# Patient Record
Sex: Female | Born: 1950 | Race: White | Hispanic: No | Smoking: Never smoker
Health system: Southern US, Community
[De-identification: ages and names within clinical notes are randomized; demographics above are authoritative.]

## PROBLEM LIST (undated history)

## (undated) DIAGNOSIS — M1990 Unspecified osteoarthritis, unspecified site: Secondary | ICD-10-CM

## (undated) DIAGNOSIS — E7800 Pure hypercholesterolemia, unspecified: Secondary | ICD-10-CM

## (undated) DIAGNOSIS — Z950 Presence of cardiac pacemaker: Secondary | ICD-10-CM

## (undated) DIAGNOSIS — M797 Fibromyalgia: Secondary | ICD-10-CM

## (undated) DIAGNOSIS — I499 Cardiac arrhythmia, unspecified: Secondary | ICD-10-CM

## (undated) DIAGNOSIS — R112 Nausea with vomiting, unspecified: Secondary | ICD-10-CM

## (undated) DIAGNOSIS — Z9889 Other specified postprocedural states: Secondary | ICD-10-CM

## (undated) DIAGNOSIS — I2510 Atherosclerotic heart disease of native coronary artery without angina pectoris: Secondary | ICD-10-CM

## (undated) DIAGNOSIS — C801 Malignant (primary) neoplasm, unspecified: Secondary | ICD-10-CM

## (undated) DIAGNOSIS — R51 Headache: Secondary | ICD-10-CM

## (undated) DIAGNOSIS — I10 Essential (primary) hypertension: Secondary | ICD-10-CM

## (undated) DIAGNOSIS — F419 Anxiety disorder, unspecified: Secondary | ICD-10-CM

## (undated) HISTORY — PX: INSERT / REPLACE / REMOVE PACEMAKER: SUR710

## (undated) HISTORY — PX: JOINT REPLACEMENT: SHX530

## (undated) HISTORY — PX: ABDOMINAL HYSTERECTOMY: SHX81

## (undated) HISTORY — PX: CHOLECYSTECTOMY: SHX55

## (undated) HISTORY — PX: FRACTURE SURGERY: SHX138

---

## 2006-01-31 ENCOUNTER — Emergency Department (HOSPITAL_COMMUNITY): Admission: EM | Admit: 2006-01-31 | Discharge: 2006-01-31 | Payer: Self-pay | Admitting: Emergency Medicine

## 2007-06-29 ENCOUNTER — Emergency Department (HOSPITAL_COMMUNITY): Admission: EM | Admit: 2007-06-29 | Discharge: 2007-06-29 | Payer: Self-pay | Admitting: Emergency Medicine

## 2007-07-10 ENCOUNTER — Emergency Department (HOSPITAL_COMMUNITY): Admission: EM | Admit: 2007-07-10 | Discharge: 2007-07-10 | Payer: Self-pay | Admitting: Emergency Medicine

## 2007-07-14 ENCOUNTER — Emergency Department (HOSPITAL_COMMUNITY): Admission: EM | Admit: 2007-07-14 | Discharge: 2007-07-14 | Payer: Self-pay | Admitting: Emergency Medicine

## 2007-07-31 ENCOUNTER — Emergency Department (HOSPITAL_COMMUNITY): Admission: EM | Admit: 2007-07-31 | Discharge: 2007-07-31 | Payer: Self-pay | Admitting: Emergency Medicine

## 2007-08-01 ENCOUNTER — Emergency Department (HOSPITAL_COMMUNITY): Admission: EM | Admit: 2007-08-01 | Discharge: 2007-08-01 | Payer: Self-pay | Admitting: Emergency Medicine

## 2007-08-30 ENCOUNTER — Emergency Department (HOSPITAL_COMMUNITY): Admission: EM | Admit: 2007-08-30 | Discharge: 2007-08-30 | Payer: Self-pay | Admitting: Emergency Medicine

## 2007-09-05 ENCOUNTER — Inpatient Hospital Stay (HOSPITAL_COMMUNITY): Admission: EM | Admit: 2007-09-05 | Discharge: 2007-09-11 | Payer: Self-pay | Admitting: Emergency Medicine

## 2007-09-10 ENCOUNTER — Encounter (INDEPENDENT_AMBULATORY_CARE_PROVIDER_SITE_OTHER): Payer: Self-pay | Admitting: Gastroenterology

## 2007-09-30 ENCOUNTER — Inpatient Hospital Stay (HOSPITAL_COMMUNITY): Admission: EM | Admit: 2007-09-30 | Discharge: 2007-10-09 | Payer: Self-pay | Admitting: Emergency Medicine

## 2007-10-20 ENCOUNTER — Emergency Department (HOSPITAL_COMMUNITY): Admission: EM | Admit: 2007-10-20 | Discharge: 2007-10-20 | Payer: Self-pay | Admitting: Emergency Medicine

## 2007-10-24 ENCOUNTER — Emergency Department (HOSPITAL_COMMUNITY): Admission: EM | Admit: 2007-10-24 | Discharge: 2007-10-24 | Payer: Self-pay | Admitting: Emergency Medicine

## 2007-10-24 ENCOUNTER — Ambulatory Visit: Payer: Self-pay | Admitting: Vascular Surgery

## 2007-10-24 ENCOUNTER — Encounter (INDEPENDENT_AMBULATORY_CARE_PROVIDER_SITE_OTHER): Payer: Self-pay | Admitting: Emergency Medicine

## 2007-11-25 ENCOUNTER — Emergency Department (HOSPITAL_COMMUNITY): Admission: EM | Admit: 2007-11-25 | Discharge: 2007-11-25 | Payer: Self-pay | Admitting: Emergency Medicine

## 2007-12-06 ENCOUNTER — Emergency Department (HOSPITAL_COMMUNITY): Admission: EM | Admit: 2007-12-06 | Discharge: 2007-12-06 | Payer: Self-pay | Admitting: Emergency Medicine

## 2007-12-14 ENCOUNTER — Emergency Department (HOSPITAL_COMMUNITY): Admission: EM | Admit: 2007-12-14 | Discharge: 2007-12-14 | Payer: Self-pay | Admitting: Emergency Medicine

## 2008-02-12 ENCOUNTER — Emergency Department (HOSPITAL_COMMUNITY): Admission: EM | Admit: 2008-02-12 | Discharge: 2008-02-12 | Payer: Self-pay | Admitting: Emergency Medicine

## 2008-02-14 ENCOUNTER — Inpatient Hospital Stay (HOSPITAL_COMMUNITY): Admission: EM | Admit: 2008-02-14 | Discharge: 2008-02-26 | Payer: Self-pay | Admitting: Emergency Medicine

## 2008-03-15 ENCOUNTER — Emergency Department (HOSPITAL_COMMUNITY): Admission: EM | Admit: 2008-03-15 | Discharge: 2008-03-15 | Payer: Self-pay | Admitting: Emergency Medicine

## 2008-05-05 ENCOUNTER — Emergency Department (HOSPITAL_COMMUNITY): Admission: EM | Admit: 2008-05-05 | Discharge: 2008-05-05 | Payer: Self-pay | Admitting: Emergency Medicine

## 2008-05-07 ENCOUNTER — Emergency Department (HOSPITAL_COMMUNITY): Admission: EM | Admit: 2008-05-07 | Discharge: 2008-05-07 | Payer: Self-pay | Admitting: Emergency Medicine

## 2008-05-14 ENCOUNTER — Emergency Department (HOSPITAL_COMMUNITY): Admission: EM | Admit: 2008-05-14 | Discharge: 2008-05-14 | Payer: Self-pay | Admitting: Emergency Medicine

## 2008-05-17 ENCOUNTER — Emergency Department (HOSPITAL_COMMUNITY): Admission: EM | Admit: 2008-05-17 | Discharge: 2008-05-18 | Payer: Self-pay | Admitting: Emergency Medicine

## 2008-05-24 ENCOUNTER — Emergency Department (HOSPITAL_COMMUNITY): Admission: EM | Admit: 2008-05-24 | Discharge: 2008-05-24 | Payer: Self-pay | Admitting: Emergency Medicine

## 2008-05-26 ENCOUNTER — Inpatient Hospital Stay (HOSPITAL_COMMUNITY): Admission: EM | Admit: 2008-05-26 | Discharge: 2008-05-29 | Payer: Self-pay | Admitting: Emergency Medicine

## 2008-06-04 ENCOUNTER — Emergency Department (HOSPITAL_COMMUNITY): Admission: EM | Admit: 2008-06-04 | Discharge: 2008-06-04 | Payer: Self-pay | Admitting: Emergency Medicine

## 2008-06-12 ENCOUNTER — Emergency Department (HOSPITAL_COMMUNITY): Admission: EM | Admit: 2008-06-12 | Discharge: 2008-06-13 | Payer: Self-pay | Admitting: Emergency Medicine

## 2008-06-14 ENCOUNTER — Emergency Department (HOSPITAL_COMMUNITY): Admission: EM | Admit: 2008-06-14 | Discharge: 2008-06-14 | Payer: Self-pay | Admitting: Emergency Medicine

## 2008-07-27 ENCOUNTER — Emergency Department (HOSPITAL_COMMUNITY): Admission: EM | Admit: 2008-07-27 | Discharge: 2008-07-27 | Payer: Self-pay | Admitting: Emergency Medicine

## 2008-08-15 ENCOUNTER — Emergency Department (HOSPITAL_COMMUNITY): Admission: EM | Admit: 2008-08-15 | Discharge: 2008-08-15 | Payer: Self-pay | Admitting: Emergency Medicine

## 2008-10-22 ENCOUNTER — Emergency Department (HOSPITAL_BASED_OUTPATIENT_CLINIC_OR_DEPARTMENT_OTHER): Admission: EM | Admit: 2008-10-22 | Discharge: 2008-10-22 | Payer: Self-pay | Admitting: Emergency Medicine

## 2008-10-25 ENCOUNTER — Emergency Department (HOSPITAL_BASED_OUTPATIENT_CLINIC_OR_DEPARTMENT_OTHER): Admission: EM | Admit: 2008-10-25 | Discharge: 2008-10-25 | Payer: Self-pay | Admitting: Emergency Medicine

## 2008-10-29 ENCOUNTER — Inpatient Hospital Stay (HOSPITAL_COMMUNITY): Admission: EM | Admit: 2008-10-29 | Discharge: 2008-10-31 | Payer: Self-pay | Admitting: Emergency Medicine

## 2008-11-09 ENCOUNTER — Inpatient Hospital Stay (HOSPITAL_COMMUNITY): Admission: EM | Admit: 2008-11-09 | Discharge: 2008-11-12 | Payer: Self-pay | Admitting: Emergency Medicine

## 2008-11-26 ENCOUNTER — Emergency Department (HOSPITAL_COMMUNITY): Admission: EM | Admit: 2008-11-26 | Discharge: 2008-11-26 | Payer: Self-pay | Admitting: Emergency Medicine

## 2008-11-29 ENCOUNTER — Emergency Department (HOSPITAL_COMMUNITY): Admission: EM | Admit: 2008-11-29 | Discharge: 2008-11-29 | Payer: Self-pay | Admitting: Emergency Medicine

## 2008-12-12 ENCOUNTER — Emergency Department (HOSPITAL_COMMUNITY): Admission: EM | Admit: 2008-12-12 | Discharge: 2008-12-12 | Payer: Self-pay | Admitting: Emergency Medicine

## 2008-12-26 ENCOUNTER — Emergency Department (HOSPITAL_COMMUNITY): Admission: EM | Admit: 2008-12-26 | Discharge: 2008-12-26 | Payer: Self-pay | Admitting: Emergency Medicine

## 2008-12-31 ENCOUNTER — Emergency Department (HOSPITAL_COMMUNITY): Admission: EM | Admit: 2008-12-31 | Discharge: 2008-12-31 | Payer: Self-pay | Admitting: Emergency Medicine

## 2009-01-03 ENCOUNTER — Emergency Department (HOSPITAL_COMMUNITY): Admission: EM | Admit: 2009-01-03 | Discharge: 2009-01-03 | Payer: Self-pay | Admitting: Emergency Medicine

## 2009-02-07 ENCOUNTER — Emergency Department (HOSPITAL_COMMUNITY): Admission: EM | Admit: 2009-02-07 | Discharge: 2009-02-07 | Payer: Self-pay | Admitting: Emergency Medicine

## 2009-03-13 ENCOUNTER — Emergency Department (HOSPITAL_COMMUNITY): Admission: EM | Admit: 2009-03-13 | Discharge: 2009-03-13 | Payer: Self-pay | Admitting: Emergency Medicine

## 2009-03-17 ENCOUNTER — Inpatient Hospital Stay (HOSPITAL_COMMUNITY): Admission: EM | Admit: 2009-03-17 | Discharge: 2009-03-19 | Payer: Self-pay | Admitting: Emergency Medicine

## 2009-03-30 ENCOUNTER — Emergency Department (HOSPITAL_COMMUNITY): Admission: EM | Admit: 2009-03-30 | Discharge: 2009-03-30 | Payer: Self-pay | Admitting: Emergency Medicine

## 2009-05-11 ENCOUNTER — Emergency Department (HOSPITAL_COMMUNITY): Admission: EM | Admit: 2009-05-11 | Discharge: 2009-05-11 | Payer: Self-pay | Admitting: Emergency Medicine

## 2009-06-20 ENCOUNTER — Emergency Department (HOSPITAL_COMMUNITY): Admission: EM | Admit: 2009-06-20 | Discharge: 2009-06-20 | Payer: Self-pay | Admitting: Emergency Medicine

## 2009-07-25 ENCOUNTER — Emergency Department (HOSPITAL_COMMUNITY): Admission: EM | Admit: 2009-07-25 | Discharge: 2009-07-25 | Payer: Self-pay | Admitting: Emergency Medicine

## 2009-07-31 ENCOUNTER — Emergency Department (HOSPITAL_COMMUNITY): Admission: EM | Admit: 2009-07-31 | Discharge: 2009-07-31 | Payer: Self-pay | Admitting: Emergency Medicine

## 2009-08-14 ENCOUNTER — Emergency Department (HOSPITAL_COMMUNITY): Admission: EM | Admit: 2009-08-14 | Discharge: 2009-08-14 | Payer: Self-pay | Admitting: Emergency Medicine

## 2009-08-21 ENCOUNTER — Observation Stay (HOSPITAL_COMMUNITY): Admission: EM | Admit: 2009-08-21 | Discharge: 2009-08-22 | Payer: Self-pay | Admitting: Emergency Medicine

## 2009-09-16 ENCOUNTER — Inpatient Hospital Stay (HOSPITAL_COMMUNITY): Admission: EM | Admit: 2009-09-16 | Discharge: 2009-09-17 | Payer: Self-pay | Admitting: Emergency Medicine

## 2009-09-24 ENCOUNTER — Emergency Department (HOSPITAL_COMMUNITY): Admission: EM | Admit: 2009-09-24 | Discharge: 2009-09-24 | Payer: Self-pay | Admitting: Emergency Medicine

## 2009-09-26 ENCOUNTER — Emergency Department (HOSPITAL_COMMUNITY): Admission: EM | Admit: 2009-09-26 | Discharge: 2009-09-26 | Payer: Self-pay | Admitting: Emergency Medicine

## 2009-09-30 ENCOUNTER — Emergency Department (HOSPITAL_COMMUNITY): Admission: EM | Admit: 2009-09-30 | Discharge: 2009-09-30 | Payer: Self-pay | Admitting: Emergency Medicine

## 2009-10-02 ENCOUNTER — Emergency Department (HOSPITAL_COMMUNITY): Admission: EM | Admit: 2009-10-02 | Discharge: 2009-10-02 | Payer: Self-pay | Admitting: Emergency Medicine

## 2009-12-26 ENCOUNTER — Inpatient Hospital Stay (HOSPITAL_COMMUNITY): Admission: EM | Admit: 2009-12-26 | Discharge: 2009-12-29 | Payer: Self-pay | Admitting: Emergency Medicine

## 2009-12-26 ENCOUNTER — Ambulatory Visit: Payer: Self-pay | Admitting: Internal Medicine

## 2009-12-26 ENCOUNTER — Ambulatory Visit: Payer: Self-pay | Admitting: Cardiology

## 2010-01-29 ENCOUNTER — Emergency Department (HOSPITAL_COMMUNITY): Admission: EM | Admit: 2010-01-29 | Discharge: 2010-01-29 | Payer: Self-pay | Admitting: Emergency Medicine

## 2010-03-26 ENCOUNTER — Observation Stay (HOSPITAL_COMMUNITY): Admission: EM | Admit: 2010-03-26 | Discharge: 2010-03-28 | Payer: Self-pay | Admitting: Emergency Medicine

## 2010-03-26 ENCOUNTER — Ambulatory Visit: Payer: Self-pay | Admitting: Internal Medicine

## 2010-04-01 ENCOUNTER — Emergency Department (HOSPITAL_COMMUNITY): Admission: EM | Admit: 2010-04-01 | Discharge: 2010-04-01 | Payer: Self-pay | Admitting: Emergency Medicine

## 2010-04-22 ENCOUNTER — Inpatient Hospital Stay (HOSPITAL_COMMUNITY): Admission: EM | Admit: 2010-04-22 | Discharge: 2010-04-25 | Payer: Self-pay | Admitting: Emergency Medicine

## 2010-04-29 ENCOUNTER — Emergency Department (HOSPITAL_COMMUNITY): Admission: EM | Admit: 2010-04-29 | Discharge: 2010-04-29 | Payer: Self-pay | Admitting: Emergency Medicine

## 2010-05-04 ENCOUNTER — Emergency Department (HOSPITAL_COMMUNITY): Admission: EM | Admit: 2010-05-04 | Discharge: 2010-05-04 | Payer: Self-pay | Admitting: Emergency Medicine

## 2010-05-06 ENCOUNTER — Emergency Department (HOSPITAL_COMMUNITY): Admission: EM | Admit: 2010-05-06 | Discharge: 2010-05-06 | Payer: Self-pay | Admitting: Emergency Medicine

## 2010-05-22 ENCOUNTER — Emergency Department (HOSPITAL_COMMUNITY): Admission: EM | Admit: 2010-05-22 | Discharge: 2010-05-22 | Payer: Self-pay | Admitting: Emergency Medicine

## 2010-07-26 ENCOUNTER — Emergency Department (HOSPITAL_COMMUNITY): Admission: EM | Admit: 2010-07-26 | Discharge: 2010-07-26 | Payer: Self-pay | Admitting: Emergency Medicine

## 2010-10-14 ENCOUNTER — Emergency Department (INDEPENDENT_AMBULATORY_CARE_PROVIDER_SITE_OTHER)
Admission: EM | Admit: 2010-10-14 | Discharge: 2010-10-14 | Payer: Self-pay | Source: Home / Self Care | Admitting: Emergency Medicine

## 2010-10-14 DIAGNOSIS — R51 Headache: Secondary | ICD-10-CM

## 2010-10-14 LAB — DIFFERENTIAL
Basophils Absolute: 0 10*3/uL (ref 0.0–0.1)
Eosinophils Relative: 0 % (ref 0–5)
Lymphocytes Relative: 15 % (ref 12–46)
Lymphs Abs: 1.8 10*3/uL (ref 0.7–4.0)
Neutro Abs: 9.5 10*3/uL — ABNORMAL HIGH (ref 1.7–7.7)
Neutrophils Relative %: 80 % — ABNORMAL HIGH (ref 43–77)

## 2010-10-14 LAB — BASIC METABOLIC PANEL
BUN: 7 mg/dL (ref 6–23)
CO2: 23 mEq/L (ref 19–32)
Chloride: 109 mEq/L (ref 96–112)
Glucose, Bld: 106 mg/dL — ABNORMAL HIGH (ref 70–99)
Potassium: 3.9 mEq/L (ref 3.5–5.1)
Sodium: 144 mEq/L (ref 135–145)

## 2010-10-14 LAB — CBC
HCT: 38.4 % (ref 36.0–46.0)
Hemoglobin: 12.8 g/dL (ref 12.0–15.0)
MCV: 82.6 fL (ref 78.0–100.0)
RBC: 4.65 MIL/uL (ref 3.87–5.11)
WBC: 11.9 10*3/uL — ABNORMAL HIGH (ref 4.0–10.5)

## 2010-12-01 LAB — URINE MICROSCOPIC-ADD ON

## 2010-12-01 LAB — URINALYSIS, ROUTINE W REFLEX MICROSCOPIC
Bilirubin Urine: NEGATIVE
Glucose, UA: NEGATIVE mg/dL
Ketones, ur: NEGATIVE mg/dL
Specific Gravity, Urine: 1.006 (ref 1.005–1.030)
pH: 6.5 (ref 5.0–8.0)

## 2010-12-02 LAB — URINALYSIS, ROUTINE W REFLEX MICROSCOPIC
Bilirubin Urine: NEGATIVE
Glucose, UA: NEGATIVE mg/dL
Glucose, UA: NEGATIVE mg/dL
Ketones, ur: NEGATIVE mg/dL
Nitrite: NEGATIVE
Protein, ur: NEGATIVE mg/dL
Protein, ur: NEGATIVE mg/dL
Specific Gravity, Urine: 1.013 (ref 1.005–1.030)
Specific Gravity, Urine: 1.016 (ref 1.005–1.030)
Urobilinogen, UA: 0.2 mg/dL (ref 0.0–1.0)
Urobilinogen, UA: 0.2 mg/dL (ref 0.0–1.0)
pH: 5.5 (ref 5.0–8.0)

## 2010-12-02 LAB — DIFFERENTIAL
Basophils Absolute: 0 10*3/uL (ref 0.0–0.1)
Basophils Absolute: 0 10*3/uL (ref 0.0–0.1)
Basophils Relative: 0 % (ref 0–1)
Basophils Relative: 0 % (ref 0–1)
Eosinophils Absolute: 0.1 10*3/uL (ref 0.0–0.7)
Eosinophils Relative: 1 % (ref 0–5)
Eosinophils Relative: 1 % (ref 0–5)
Lymphocytes Relative: 40 % (ref 12–46)
Lymphocytes Relative: 44 % (ref 12–46)
Lymphs Abs: 2.4 10*3/uL (ref 0.7–4.0)
Monocytes Absolute: 0.4 10*3/uL (ref 0.1–1.0)
Monocytes Absolute: 0.5 10*3/uL (ref 0.1–1.0)
Monocytes Absolute: 0.5 10*3/uL (ref 0.1–1.0)
Neutro Abs: 3 10*3/uL (ref 1.7–7.7)
Neutro Abs: 3.1 10*3/uL (ref 1.7–7.7)
Neutro Abs: 5.3 10*3/uL (ref 1.7–7.7)
Neutrophils Relative %: 47 % (ref 43–77)

## 2010-12-02 LAB — STOOL CULTURE

## 2010-12-02 LAB — CLOSTRIDIUM DIFFICILE EIA
C difficile Toxins A+B, EIA: NEGATIVE
C difficile Toxins A+B, EIA: NEGATIVE

## 2010-12-02 LAB — CK TOTAL AND CKMB (NOT AT ARMC): CK, MB: 0.9 ng/mL (ref 0.3–4.0)

## 2010-12-02 LAB — COMPREHENSIVE METABOLIC PANEL
ALT: 15 U/L (ref 0–35)
Albumin: 3.9 g/dL (ref 3.5–5.2)
Alkaline Phosphatase: 145 U/L — ABNORMAL HIGH (ref 39–117)
Alkaline Phosphatase: 154 U/L — ABNORMAL HIGH (ref 39–117)
Alkaline Phosphatase: 165 U/L — ABNORMAL HIGH (ref 39–117)
BUN: 14 mg/dL (ref 6–23)
BUN: 7 mg/dL (ref 6–23)
CO2: 25 mEq/L (ref 19–32)
Calcium: 8.9 mg/dL (ref 8.4–10.5)
Calcium: 9 mg/dL (ref 8.4–10.5)
Chloride: 111 mEq/L (ref 96–112)
Creatinine, Ser: 0.77 mg/dL (ref 0.4–1.2)
GFR calc Af Amer: >60 mL/min (ref >60)
GFR calc non Af Amer: >60 mL/min (ref >60)
Glucose, Bld: 105 mg/dL — ABNORMAL HIGH (ref 70–99)
Glucose, Bld: 119 mg/dL — ABNORMAL HIGH (ref 70–99)
Potassium: 3.9 mEq/L (ref 3.5–5.1)
Potassium: 4.1 mEq/L (ref 3.5–5.1)
Potassium: 4.1 mEq/L (ref 3.5–5.1)
Sodium: 138 mEq/L (ref 135–145)
Total Bilirubin: 0.4 mg/dL (ref 0.3–1.2)
Total Protein: 7.8 g/dL (ref 6.0–8.3)
Total Protein: 8.2 g/dL (ref 6.0–8.3)

## 2010-12-02 LAB — POCT CARDIAC MARKERS
CKMB, poc: 1.1 ng/mL (ref 1.0–8.0)
CKMB, poc: <1 ng/mL — ABNORMAL LOW (ref 1.0–8.0)
CKMB, poc: <1 ng/mL — ABNORMAL LOW (ref 1.0–8.0)
Myoglobin, poc: 58.1 ng/mL (ref 12–200)
Troponin i, poc: <0.05 ng/mL (ref 0.00–0.09)
Troponin i, poc: <0.05 ng/mL (ref 0.00–0.09)
Troponin i, poc: <0.05 ng/mL (ref 0.00–0.09)

## 2010-12-02 LAB — CBC
HCT: 34.8 % — ABNORMAL LOW (ref 36.0–46.0)
HCT: 35.2 % — ABNORMAL LOW (ref 36.0–46.0)
HCT: 38.5 % (ref 36.0–46.0)
Hemoglobin: 13.8 g/dL (ref 12.0–15.0)
MCH: 27.8 pg (ref 26.0–34.0)
MCH: 28.1 pg (ref 26.0–34.0)
MCH: 28.6 pg (ref 26.0–34.0)
MCHC: 33.3 g/dL (ref 30.0–36.0)
MCHC: 34.2 g/dL (ref 30.0–36.0)
MCV: 85.7 fL (ref 78.0–100.0)
MCV: 86.4 fL (ref 78.0–100.0)
MCV: 87.9 fL (ref 78.0–100.0)
Platelets: 273 10*3/uL (ref 150–400)
Platelets: 308 10*3/uL (ref 150–400)
Platelets: DECREASED 10*3/uL (ref 150–400)
RBC: 4.03 MIL/uL (ref 3.87–5.11)
RBC: 4.38 MIL/uL (ref 3.87–5.11)
RBC: 4.54 MIL/uL (ref 3.87–5.11)
RDW: 14.1 % (ref 11.5–15.5)
RDW: 14.8 % (ref 11.5–15.5)
WBC: 5.9 10*3/uL (ref 4.0–10.5)
WBC: 6.2 10*3/uL (ref 4.0–10.5)
WBC: 6.3 10*3/uL (ref 4.0–10.5)

## 2010-12-02 LAB — LIPASE, BLOOD: Lipase: 24 U/L (ref 11–59)

## 2010-12-02 LAB — POCT I-STAT, CHEM 8
BUN: 8 mg/dL (ref 6–23)
Chloride: 107 mEq/L (ref 96–112)
Creatinine, Ser: 0.6 mg/dL (ref 0.4–1.2)
HCT: 40 % (ref 36.0–46.0)
HCT: 44 % (ref 36.0–46.0)
Hemoglobin: 13.6 g/dL (ref 12.0–15.0)
Hemoglobin: 15 g/dL (ref 12.0–15.0)
Potassium: 4.3 mEq/L (ref 3.5–5.1)
Potassium: 5.5 mEq/L — ABNORMAL HIGH (ref 3.5–5.1)
Sodium: 137 mEq/L (ref 135–145)
Sodium: 141 mEq/L (ref 135–145)
Sodium: 141 mEq/L (ref 135–145)
TCO2: 23 mmol/L (ref 0–100)
TCO2: 27 mmol/L (ref 0–100)

## 2010-12-02 LAB — RAPID URINE DRUG SCREEN, HOSP PERFORMED
Amphetamines: NOT DETECTED
Amphetamines: NOT DETECTED
Barbiturates: NOT DETECTED
Benzodiazepines: POSITIVE — AB
Benzodiazepines: POSITIVE — AB
Benzodiazepines: POSITIVE — AB
Cocaine: NOT DETECTED
Opiates: NOT DETECTED
Tetrahydrocannabinol: NOT DETECTED
Tetrahydrocannabinol: NOT DETECTED

## 2010-12-02 LAB — GIARDIA/CRYPTOSPORIDIUM SCREEN(EIA)
Cryptosporidium Screen (EIA): NEGATIVE
Giardia Screen - EIA: NEGATIVE

## 2010-12-02 LAB — BASIC METABOLIC PANEL
Calcium: 8.8 mg/dL (ref 8.4–10.5)
Chloride: 106 mEq/L (ref 96–112)
Creatinine, Ser: 0.69 mg/dL (ref 0.4–1.2)
GFR calc Af Amer: >60 mL/min (ref >60)
Sodium: 140 mEq/L (ref 135–145)

## 2010-12-02 LAB — URINE MICROSCOPIC-ADD ON

## 2010-12-02 LAB — URINE CULTURE

## 2010-12-02 LAB — MAGNESIUM
Magnesium: 2.3 mg/dL (ref 1.5–2.5)
Magnesium: 2.3 mg/dL (ref 1.5–2.5)

## 2010-12-02 LAB — TROPONIN I: Troponin I: 0.02 ng/mL (ref 0.00–0.06)

## 2010-12-02 LAB — FECAL LACTOFERRIN, QUANT

## 2010-12-03 LAB — POCT I-STAT, CHEM 8
Calcium, Ion: 1.09 mmol/L — ABNORMAL LOW (ref 1.12–1.32)
Glucose, Bld: 113 mg/dL — ABNORMAL HIGH (ref 70–99)
HCT: 40 % (ref 36.0–46.0)
Hemoglobin: 13.6 g/dL (ref 12.0–15.0)

## 2010-12-04 LAB — RAPID URINE DRUG SCREEN, HOSP PERFORMED
Benzodiazepines: POSITIVE — AB
Cocaine: NOT DETECTED
Tetrahydrocannabinol: NOT DETECTED

## 2010-12-04 LAB — FECAL LACTOFERRIN, QUANT: Fecal Lactoferrin: NEGATIVE

## 2010-12-04 LAB — COMPREHENSIVE METABOLIC PANEL
ALT: 27 U/L (ref 0–35)
Calcium: 9 mg/dL (ref 8.4–10.5)
GFR calc Af Amer: >60 mL/min (ref >60)
Glucose, Bld: 96 mg/dL (ref 70–99)
Sodium: 138 mEq/L (ref 135–145)
Total Protein: 8.3 g/dL (ref 6.0–8.3)

## 2010-12-04 LAB — URINALYSIS, ROUTINE W REFLEX MICROSCOPIC
Glucose, UA: NEGATIVE mg/dL
Ketones, ur: NEGATIVE mg/dL
Nitrite: NEGATIVE
Protein, ur: NEGATIVE mg/dL

## 2010-12-04 LAB — CLOSTRIDIUM DIFFICILE EIA
C difficile Toxins A+B, EIA: NEGATIVE
C difficile Toxins A+B, EIA: NEGATIVE

## 2010-12-04 LAB — FECAL FAT, QUALITATIVE

## 2010-12-04 LAB — CBC
HCT: 42 % (ref 36.0–46.0)
MCHC: 33.2 g/dL (ref 30.0–36.0)
RDW: 15.9 % — ABNORMAL HIGH (ref 11.5–15.5)

## 2010-12-04 LAB — DIFFERENTIAL
Eosinophils Absolute: 0.1 10*3/uL (ref 0.0–0.7)
Lymphocytes Relative: 37 % (ref 12–46)
Lymphs Abs: 3.7 10*3/uL (ref 0.7–4.0)
Monocytes Relative: 6 % (ref 3–12)
Neutro Abs: 5.6 10*3/uL (ref 1.7–7.7)
Neutrophils Relative %: 55 % (ref 43–77)

## 2010-12-04 LAB — POTASSIUM: Potassium: 4.1 mEq/L (ref 3.5–5.1)

## 2010-12-06 LAB — DIFFERENTIAL
Basophils Absolute: 0 10*3/uL (ref 0.0–0.1)
Basophils Relative: 0 % (ref 0–1)
Eosinophils Absolute: 0.1 10*3/uL (ref 0.0–0.7)
Lymphs Abs: 2.8 10*3/uL (ref 0.7–4.0)
Neutrophils Relative %: 54 % (ref 43–77)

## 2010-12-06 LAB — URINALYSIS, ROUTINE W REFLEX MICROSCOPIC
Bilirubin Urine: NEGATIVE
Ketones, ur: NEGATIVE mg/dL
Nitrite: NEGATIVE
Protein, ur: NEGATIVE mg/dL

## 2010-12-06 LAB — COMPREHENSIVE METABOLIC PANEL
ALT: 14 U/L (ref 0–35)
Alkaline Phosphatase: 106 U/L (ref 39–117)
BUN: 7 mg/dL (ref 6–23)
CO2: 24 mEq/L (ref 19–32)
Calcium: 9 mg/dL (ref 8.4–10.5)
Chloride: 104 mEq/L (ref 96–112)
GFR calc non Af Amer: >60 mL/min (ref >60)
Glucose, Bld: 108 mg/dL — ABNORMAL HIGH (ref 70–99)
Potassium: 3.6 mEq/L (ref 3.5–5.1)
Sodium: 137 mEq/L (ref 135–145)
Total Bilirubin: 0.5 mg/dL (ref 0.3–1.2)
Total Protein: 7.9 g/dL (ref 6.0–8.3)

## 2010-12-06 LAB — CBC
Hemoglobin: 12.4 g/dL (ref 12.0–15.0)
MCHC: 33.7 g/dL (ref 30.0–36.0)
RBC: 4.3 MIL/uL (ref 3.87–5.11)

## 2010-12-06 LAB — LIPASE, BLOOD: Lipase: 22 U/L (ref 11–59)

## 2010-12-07 LAB — COMPREHENSIVE METABOLIC PANEL
ALT: 31 U/L (ref 0–35)
ALT: 33 U/L (ref 0–35)
AST: 36 U/L (ref 0–37)
AST: 38 U/L — ABNORMAL HIGH (ref 0–37)
CO2: 20 mEq/L (ref 19–32)
CO2: 27 mEq/L (ref 19–32)
Calcium: 8.3 mg/dL — ABNORMAL LOW (ref 8.4–10.5)
Calcium: 9.2 mg/dL (ref 8.4–10.5)
Chloride: 105 mEq/L (ref 96–112)
Chloride: 111 mEq/L (ref 96–112)
Creatinine, Ser: 0.82 mg/dL (ref 0.4–1.2)
GFR calc Af Amer: >60 mL/min (ref >60)
GFR calc non Af Amer: >60 mL/min (ref >60)
GFR calc non Af Amer: >60 mL/min (ref >60)
Glucose, Bld: 128 mg/dL — ABNORMAL HIGH (ref 70–99)
Potassium: 4.1 mEq/L (ref 3.5–5.1)
Sodium: 141 mEq/L (ref 135–145)
Sodium: 141 mEq/L (ref 135–145)
Total Bilirubin: 0.8 mg/dL (ref 0.3–1.2)

## 2010-12-07 LAB — IRON AND TIBC
Iron: 21 ug/dL — ABNORMAL LOW (ref 42–135)
UIBC: 208 ug/dL

## 2010-12-07 LAB — CBC
HCT: 31 % — ABNORMAL LOW (ref 36.0–46.0)
Hemoglobin: 10.2 g/dL — ABNORMAL LOW (ref 12.0–15.0)
Hemoglobin: 11.4 g/dL — ABNORMAL LOW (ref 12.0–15.0)
MCHC: 33.1 g/dL (ref 30.0–36.0)
MCV: 86.4 fL (ref 78.0–100.0)
RBC: 3.83 MIL/uL — ABNORMAL LOW (ref 3.87–5.11)
RBC: 3.96 MIL/uL (ref 3.87–5.11)
RDW: 16.1 % — ABNORMAL HIGH (ref 11.5–15.5)
WBC: 17.9 10*3/uL — ABNORMAL HIGH (ref 4.0–10.5)
WBC: 9.6 10*3/uL (ref 4.0–10.5)

## 2010-12-07 LAB — BASIC METABOLIC PANEL
BUN: 1 mg/dL — ABNORMAL LOW (ref 6–23)
BUN: <1 mg/dL — ABNORMAL LOW (ref 6–23)
CO2: 27 mEq/L (ref 19–32)
CO2: 27 mEq/L (ref 19–32)
Calcium: 8.9 mg/dL (ref 8.4–10.5)
Chloride: 104 mEq/L (ref 96–112)
Creatinine, Ser: 0.64 mg/dL (ref 0.4–1.2)
Creatinine, Ser: 0.71 mg/dL (ref 0.4–1.2)
GFR calc non Af Amer: >60 mL/min (ref >60)
Glucose, Bld: 115 mg/dL — ABNORMAL HIGH (ref 70–99)
Glucose, Bld: 136 mg/dL — ABNORMAL HIGH (ref 70–99)
Glucose, Bld: 140 mg/dL — ABNORMAL HIGH (ref 70–99)
Potassium: 3.2 mEq/L — ABNORMAL LOW (ref 3.5–5.1)
Sodium: 141 mEq/L (ref 135–145)

## 2010-12-07 LAB — DIFFERENTIAL
Basophils Absolute: 0 10*3/uL (ref 0.0–0.1)
Basophils Absolute: 0.1 10*3/uL (ref 0.0–0.1)
Eosinophils Absolute: 0.1 10*3/uL (ref 0.0–0.7)
Eosinophils Relative: 1 % (ref 0–5)
Eosinophils Relative: 3 % (ref 0–5)
Lymphocytes Relative: 37 % (ref 12–46)
Lymphs Abs: 2 10*3/uL (ref 0.7–4.0)
Lymphs Abs: 2.1 10*3/uL (ref 0.7–4.0)
Monocytes Absolute: 0.4 10*3/uL (ref 0.1–1.0)
Monocytes Relative: 8 % (ref 3–12)
Neutrophils Relative %: 71 % (ref 43–77)

## 2010-12-07 LAB — BRAIN NATRIURETIC PEPTIDE: Pro B Natriuretic peptide (BNP): 107 pg/mL — ABNORMAL HIGH (ref 0.0–100.0)

## 2010-12-07 LAB — URINALYSIS, ROUTINE W REFLEX MICROSCOPIC
Bilirubin Urine: NEGATIVE
Ketones, ur: NEGATIVE mg/dL
Nitrite: POSITIVE — AB
Protein, ur: NEGATIVE mg/dL
Urobilinogen, UA: 0.2 mg/dL (ref 0.0–1.0)
pH: 6 (ref 5.0–8.0)

## 2010-12-07 LAB — CARDIAC PANEL(CRET KIN+CKTOT+MB+TROPI)
CK, MB: 1.1 ng/mL (ref 0.3–4.0)
CK, MB: 1.2 ng/mL (ref 0.3–4.0)
Relative Index: INVALID (ref 0.0–2.5)
Total CK: 37 U/L (ref 7–177)

## 2010-12-07 LAB — VITAMIN B12: Vitamin B-12: 233 pg/mL (ref 211–911)

## 2010-12-07 LAB — CK TOTAL AND CKMB (NOT AT ARMC)
CK, MB: 1.2 ng/mL (ref 0.3–4.0)
Relative Index: INVALID (ref 0.0–2.5)
Total CK: 35 U/L (ref 7–177)

## 2010-12-07 LAB — GAMMA GT: GGT: 102 U/L — ABNORMAL HIGH (ref 7–51)

## 2010-12-07 LAB — POCT CARDIAC MARKERS: Troponin i, poc: <0.05 ng/mL (ref 0.00–0.09)

## 2010-12-19 LAB — CBC
Hemoglobin: 12.5 g/dL (ref 12.0–15.0)
MCV: 89.3 fL (ref 78.0–100.0)
RBC: 4.15 MIL/uL (ref 3.87–5.11)
WBC: 8.6 10*3/uL (ref 4.0–10.5)

## 2010-12-19 LAB — BASIC METABOLIC PANEL
BUN: 9 mg/dL (ref 6–23)
CO2: 22 mEq/L (ref 19–32)
Chloride: 112 mEq/L (ref 96–112)
Creatinine, Ser: 0.73 mg/dL (ref 0.4–1.2)
Potassium: 4.4 mEq/L (ref 3.5–5.1)

## 2010-12-19 LAB — MAGNESIUM: Magnesium: 2.6 mg/dL — ABNORMAL HIGH (ref 1.5–2.5)

## 2010-12-20 LAB — URINE MICROSCOPIC-ADD ON

## 2010-12-20 LAB — URINALYSIS, ROUTINE W REFLEX MICROSCOPIC
Glucose, UA: NEGATIVE mg/dL
Ketones, ur: NEGATIVE mg/dL
pH: 6 (ref 5.0–8.0)

## 2010-12-20 LAB — COMPREHENSIVE METABOLIC PANEL
ALT: 17 U/L (ref 0–35)
AST: 22 U/L (ref 0–37)
Albumin: 3.5 g/dL (ref 3.5–5.2)
Alkaline Phosphatase: 130 U/L — ABNORMAL HIGH (ref 39–117)
Potassium: 3.3 mEq/L — ABNORMAL LOW (ref 3.5–5.1)
Sodium: 141 mEq/L (ref 135–145)
Total Protein: 7.4 g/dL (ref 6.0–8.3)

## 2010-12-20 LAB — BASIC METABOLIC PANEL
CO2: 25 mEq/L (ref 19–32)
GFR calc non Af Amer: 59 mL/min — ABNORMAL LOW (ref >60)
Glucose, Bld: 243 mg/dL — ABNORMAL HIGH (ref 70–99)
Potassium: 5.2 mEq/L — ABNORMAL HIGH (ref 3.5–5.1)
Sodium: 134 mEq/L — ABNORMAL LOW (ref 135–145)

## 2010-12-20 LAB — CBC
Platelets: 295 10*3/uL (ref 150–400)
RDW: 14.4 % (ref 11.5–15.5)

## 2010-12-25 ENCOUNTER — Emergency Department (HOSPITAL_COMMUNITY): Payer: MEDICARE

## 2010-12-25 ENCOUNTER — Emergency Department (HOSPITAL_COMMUNITY)
Admission: EM | Admit: 2010-12-25 | Discharge: 2010-12-25 | Disposition: A | Discharge status: Home / Self Care | Payer: MEDICARE | Attending: Emergency Medicine | Admitting: Emergency Medicine

## 2010-12-25 DIAGNOSIS — R109 Unspecified abdominal pain: Secondary | ICD-10-CM | POA: Insufficient documentation

## 2010-12-25 DIAGNOSIS — R0789 Other chest pain: Secondary | ICD-10-CM | POA: Insufficient documentation

## 2010-12-25 DIAGNOSIS — R0609 Other forms of dyspnea: Secondary | ICD-10-CM | POA: Insufficient documentation

## 2010-12-25 DIAGNOSIS — R6883 Chills (without fever): Secondary | ICD-10-CM | POA: Insufficient documentation

## 2010-12-25 DIAGNOSIS — R0989 Other specified symptoms and signs involving the circulatory and respiratory systems: Secondary | ICD-10-CM | POA: Insufficient documentation

## 2010-12-25 DIAGNOSIS — G8929 Other chronic pain: Secondary | ICD-10-CM | POA: Insufficient documentation

## 2010-12-25 DIAGNOSIS — Z950 Presence of cardiac pacemaker: Secondary | ICD-10-CM | POA: Insufficient documentation

## 2010-12-25 DIAGNOSIS — R112 Nausea with vomiting, unspecified: Secondary | ICD-10-CM | POA: Insufficient documentation

## 2010-12-25 DIAGNOSIS — I10 Essential (primary) hypertension: Secondary | ICD-10-CM | POA: Insufficient documentation

## 2010-12-25 LAB — DIFFERENTIAL
Basophils Absolute: 0 10*3/uL (ref 0.0–0.1)
Eosinophils Absolute: 0.1 10*3/uL (ref 0.0–0.7)
Lymphs Abs: 2.9 10*3/uL (ref 0.7–4.0)
Neutro Abs: 5.7 10*3/uL (ref 1.7–7.7)

## 2010-12-25 LAB — CBC
MCH: 28.8 pg (ref 26.0–34.0)
MCHC: 33.2 g/dL (ref 30.0–36.0)
Platelets: 245 10*3/uL (ref 150–400)
RDW: 14 % (ref 11.5–15.5)

## 2010-12-25 LAB — URINE MICROSCOPIC-ADD ON

## 2010-12-25 LAB — URINALYSIS, ROUTINE W REFLEX MICROSCOPIC
Ketones, ur: NEGATIVE mg/dL
Nitrite: NEGATIVE
Protein, ur: NEGATIVE mg/dL
pH: 7 (ref 5.0–8.0)

## 2010-12-25 LAB — POCT CARDIAC MARKERS: Troponin i, poc: <0.05 ng/mL (ref 0.00–0.09)

## 2010-12-25 LAB — COMPREHENSIVE METABOLIC PANEL
AST: 19 U/L (ref 0–37)
Albumin: 3.5 g/dL (ref 3.5–5.2)
Calcium: 8.5 mg/dL (ref 8.4–10.5)
Creatinine, Ser: 0.61 mg/dL (ref 0.4–1.2)
GFR calc Af Amer: >60 mL/min (ref >60)
GFR calc non Af Amer: >60 mL/min (ref >60)

## 2010-12-25 LAB — VALPROIC ACID LEVEL: Valproic Acid Lvl: <10 ug/mL — ABNORMAL LOW (ref 50.0–100.0)

## 2010-12-25 LAB — PROTIME-INR: Prothrombin Time: 12.6 seconds (ref 11.6–15.2)

## 2010-12-26 LAB — BASIC METABOLIC PANEL
BUN: 3 mg/dL — ABNORMAL LOW (ref 6–23)
Chloride: 111 mEq/L (ref 96–112)
Glucose, Bld: 122 mg/dL — ABNORMAL HIGH (ref 70–99)
Potassium: 3.6 mEq/L (ref 3.5–5.1)

## 2010-12-26 LAB — CBC
HCT: 38.6 % (ref 36.0–46.0)
MCV: 87.4 fL (ref 78.0–100.0)
Platelets: 260 10*3/uL (ref 150–400)
WBC: 6.2 10*3/uL (ref 4.0–10.5)

## 2010-12-26 LAB — APTT

## 2010-12-29 LAB — URINE CULTURE

## 2010-12-29 LAB — URINE MICROSCOPIC-ADD ON

## 2010-12-29 LAB — URINALYSIS, ROUTINE W REFLEX MICROSCOPIC
Glucose, UA: NEGATIVE mg/dL
Hgb urine dipstick: NEGATIVE
Protein, ur: NEGATIVE mg/dL
Specific Gravity, Urine: 1.004 — ABNORMAL LOW (ref 1.005–1.030)
pH: 7.5 (ref 5.0–8.0)

## 2011-01-03 LAB — DIFFERENTIAL
Basophils Absolute: 0.1 10*3/uL (ref 0.0–0.1)
Basophils Absolute: 0 10*3/uL (ref 0.0–0.1)
Eosinophils Absolute: 0 10*3/uL (ref 0.0–0.7)
Eosinophils Absolute: 0.1 10*3/uL (ref 0.0–0.7)
Eosinophils Relative: 0 % (ref 0–5)
Eosinophils Relative: 1 % (ref 0–5)
Lymphocytes Relative: 29 % (ref 12–46)
Neutrophils Relative %: 59 % (ref 43–77)

## 2011-01-03 LAB — RAPID URINE DRUG SCREEN, HOSP PERFORMED
Amphetamines: NOT DETECTED
Barbiturates: NOT DETECTED
Benzodiazepines: POSITIVE — AB
Cocaine: NOT DETECTED

## 2011-01-03 LAB — CBC
HCT: 39.2 % (ref 36.0–46.0)
HCT: 39 % (ref 36.0–46.0)
MCHC: 34.2 g/dL (ref 30.0–36.0)
MCHC: 34.5 g/dL (ref 30.0–36.0)
MCV: 87.6 fL (ref 78.0–100.0)
MCV: 86.4 fL (ref 78.0–100.0)
Platelets: 329 10*3/uL (ref 150–400)
Platelets: 254 10*3/uL (ref 150–400)
RDW: 14.3 % (ref 11.5–15.5)
RDW: 14.2 % (ref 11.5–15.5)

## 2011-01-03 LAB — CK TOTAL AND CKMB (NOT AT ARMC)
Relative Index: INVALID (ref 0.0–2.5)
Relative Index: INVALID (ref 0.0–2.5)
Total CK: 42 U/L (ref 7–177)

## 2011-01-03 LAB — LIPID PANEL
Cholesterol: 272 mg/dL — ABNORMAL HIGH (ref 0–200)
LDL Cholesterol: 195 mg/dL — ABNORMAL HIGH (ref 0–99)

## 2011-01-03 LAB — URINALYSIS, ROUTINE W REFLEX MICROSCOPIC
Bilirubin Urine: NEGATIVE
Ketones, ur: NEGATIVE mg/dL
Nitrite: NEGATIVE
Protein, ur: NEGATIVE mg/dL

## 2011-01-03 LAB — COMPREHENSIVE METABOLIC PANEL
AST: 29 U/L (ref 0–37)
AST: 45 U/L — ABNORMAL HIGH (ref 0–37)
Albumin: 2.7 g/dL — ABNORMAL LOW (ref 3.5–5.2)
Alkaline Phosphatase: 117 U/L (ref 39–117)
CO2: 25 mEq/L (ref 19–32)
Chloride: 103 mEq/L (ref 96–112)
Chloride: 110 mEq/L (ref 96–112)
Creatinine, Ser: 0.61 mg/dL (ref 0.4–1.2)
GFR calc Af Amer: >60 mL/min (ref >60)
GFR calc Af Amer: >60 mL/min (ref >60)
GFR calc non Af Amer: >60 mL/min (ref >60)
Glucose, Bld: 126 mg/dL — ABNORMAL HIGH (ref 70–99)
Potassium: 3.7 mEq/L (ref 3.5–5.1)
Sodium: 137 mEq/L (ref 135–145)
Total Bilirubin: 0.4 mg/dL (ref 0.3–1.2)
Total Bilirubin: 0.4 mg/dL (ref 0.3–1.2)

## 2011-01-03 LAB — URINE CULTURE

## 2011-01-03 LAB — POCT I-STAT, CHEM 8
Calcium, Ion: 1.02 mmol/L — ABNORMAL LOW (ref 1.12–1.32)
Calcium, Ion: 1.12 mmol/L (ref 1.12–1.32)
Creatinine, Ser: 0.6 mg/dL (ref 0.4–1.2)
HCT: 40 % (ref 36.0–46.0)
Hemoglobin: 13.6 g/dL (ref 12.0–15.0)
Hemoglobin: 13.6 g/dL (ref 12.0–15.0)
Sodium: 144 mEq/L (ref 135–145)
Sodium: 144 mEq/L (ref 135–145)
TCO2: 25 mmol/L (ref 0–100)
TCO2: 25 mmol/L (ref 0–100)

## 2011-01-03 LAB — CULTURE, BLOOD (ROUTINE X 2): Culture: NO GROWTH

## 2011-01-03 LAB — SEDIMENTATION RATE: Sed Rate: 40 mm/hr — ABNORMAL HIGH (ref 0–22)

## 2011-01-03 LAB — PROTEIN AND GLUCOSE, CSF: Total  Protein, CSF: 40 mg/dL (ref 15–45)

## 2011-01-03 LAB — CSF CELL COUNT WITH DIFFERENTIAL: Tube #: 1

## 2011-01-03 LAB — APTT: aPTT: 30 seconds (ref 24–37)

## 2011-01-03 LAB — TROPONIN I
Troponin I: 0.01 ng/mL (ref 0.00–0.06)
Troponin I: <0.01 ng/mL (ref 0.00–0.06)

## 2011-01-03 LAB — HEMOGLOBIN A1C
Hgb A1c MFr Bld: 6.7 % — ABNORMAL HIGH (ref 4.6–6.1)
Mean Plasma Glucose: 146 mg/dL

## 2011-01-03 LAB — CSF CULTURE W GRAM STAIN: Gram Stain: NONE SEEN

## 2011-01-03 LAB — MAGNESIUM: Magnesium: 2.5 mg/dL (ref 1.5–2.5)

## 2011-01-31 NOTE — H&P (Signed)
[name], [name]              ACCOUNT NO.:  [id number]   MEDICAL RECORD NO.:  [id number]          PATIENT TYPE:  INP   LOCATION:  5524                         FACILITY:  [facility]   PHYSICIAN:  Jude Ojie, MD          DATE OF BIRTH:  1950-11-08   DATE OF ADMISSION:  03/16/2009  DATE OF DISCHARGE:                              HISTORY & PHYSICAL   CHIEF COMPLAINT:  Severe headache.   HISTORY OF PRESENTING COMPLAINT:  The patient is a 60 year old with  history of migraine headache, hypertension, and a pacemaker placement  previously with recurrent ED visits for complaints of headache with last  admission to the hospital in February 2010, presenting today with  complaints of a persistent headache.  The patient was evaluated in the  ED and required several doses of IV Dilaudid and Phenergan given tonight  and the headache still remained persistent.  The ED physician had  discussed with the neurologist on call and the plan was to admit the  patient if headaches were still persistent.  Plan was to give a dose of  Depacon.  The IV Depacon was started but these had to be discontinued as  the patient was having severe itching and we had to give her some  Benadryl.  The patient complains of associated nausea, photophobia, but  no vomiting.  No fevers.  No neck stiffness.  No syncope.  No visual  changes at this time.  At the time I saw the patient, the patient was  still having complaint of severe headaches.  I have discussed the case  with the neurologist on call Dr. Sharene Skeans who would see the patient in  the morning, but has recommended that the patient be started on DHE 45  injections at this time for management of severe migraine headaches.  Of  note, the patient had a CT scan done in the ED tonight which did not  show any acute findings.  The headache is mostly located in the  bilateral forehead, dull in nature and with severity of 10/10, at worse,  but down to about 7/10 at this time.   Headache was aggravated by sound  and light, but relieved by the patient lying still.   PAST MEDICAL HISTORY:  Significant for,  1. Hypertension.  2. Pacemaker placement.  3. Migraine headaches.  4. Chronic back pain.  5. History of fibromyalgia.   ALLERGIES:  The patient is allergic to COMPAZINE, DECADRON, IV DYE,  KEFLEX, MORPHINE, SHELLFISH, and TORADOL.   OUTPATIENT MEDICATION:  1. Valium 10 mg p.o. at bedtime.  2. Zocor 20 mg p.o. at bedtime.  3. Senokot 1 tablet p.o. as needed.  4. Colestipol 1 tablet p.o. b.i.d.  5. Atenolol 12.5 mg p.o. daily.  6. Prevacid 30 mg p.o. daily.  7. Excedrin p.o. daily.   FAMILY HISTORY:  The patient is married, lives with her husband at home.  Family history is significant for history of heart disease and diabetes  mellitus in a brother.   SOCIAL HISTORY:  The patient denies any history of smoking, alcohol, or  IV drug use.   REVIEW OF SYSTEMS:  GENERAL:  No fevers, weight loss, or loss of  appetite.  RESPIRATORY:  No cough or shortness of breath.  CVS:  No  chest pain or palpitation.  GI:  No nausea, vomiting, or diarrhea.  ENDOCRINE:  No polyuria, polydipsia, or polyphagia.  GENITOURINARY:  No  dysuria, frequency, or urgency.  HEME:  No easy skin bruising or  epistaxis.  PSYCH:  No depression.  MUSCULOSKELETAL:  No joint pains or  muscle aches.  NEURO:  Positive for severe headaches and photophobia,  but no syncope.   PHYSICAL EXAMINATION:  GENERAL:  A middle-aged Caucasian lady, currently  in moderate distress from complaints of headache.  HEENT:  Extraocular muscles intact.  NECK:  Supple.  No JVD.  No thyroid enlargement.  RESPIRATORY:  Chest clear to auscultation bilaterally.  CVS:  Heart sounds 1 and 2, regular rate and rhythm.  No murmurs or  gallops.  ABDOMEN:  Flat, nontender.  No organomegaly.  Bowel sounds positive in  all quadrants.  EXTREMITIES:  No cyanosis, clubbing, or edema.  Pedal pulses palpable  bilaterally.   NEUROLOGIC:  The patient is awake, alert, and oriented in time, place,  and person.  No focal findings at this time.   CT scan of the head done tonight did not show any acute intracranial  process.  No labs have been done at this time.  We will check CBC and  BMP with ESR in the morning.   ASSESSMENT:  1. Severe migraine headaches clearly analgesic rebound headache.  2. History of hypertension.  3. History of chronic back pain.  4. Pacemaker placement.   PLAN:  1. We will admit to inpatient under telemetry.  2. Neurology consultation.  I have discussed the case already with the      neurologist on-call Dr. Sharene Skeans who has recommended that the      patient be started on DHE 45 injections.  3. We will place the patient on DHE 45.  We will give 10 mg of IV      Reglan 15 minutes prior to the 1 mL of IV DHE 45 and repeat every 8      hourly for total of 9 doses as recommended by neurology.  We will      place the patient on DVT prophylaxis with Lovenox and also on      Protonix.   Case and plan discussed with the patient and she voiced understanding  and all questions were answered and she is in agreement with the plan of  care.      Jude Enid Baas, MD  Electronically Signed     JO/MEDQ  D:  03/17/2009  T:  03/17/2009  Job:  [id number]

## 2011-01-31 NOTE — H&P (Signed)
[name], [name]              ACCOUNT NO.:  [id number]   MEDICAL RECORD NO.:  [id number]          PATIENT TYPE:  INP   LOCATION:  5506                         FACILITY:  [facility]   PHYSICIAN:  Massie Maroon, MD        DATE OF BIRTH:  [birth year]   DATE OF ADMISSION:  10/27/2008  DATE OF DISCHARGE:                              HISTORY & PHYSICAL   CHIEF COMPLAINT:  Headache.   HISTORY OF PRESENT ILLNESS:  A 60 year old female with a history of  headaches apparently came to the ED this week for the third time with  complaints of severe headache.  It was apparently worse over the last 24  hours.  It was bilateral, sharp, and achy starting towards the sides of  her hand and then radiating all the way around.  The patient is so  severe and incapacitating.  It was associated with some photophobia and  nausea and vomiting.  There was no emesis of blood.  The patient denied  any stabbing sensation behind the eyes.  The patient denied any sinus  pressure or tenderness.  The patient states that the pain was 10/10 at  its worst.  She had a CT scan of the brain in the ER, which showed no  acute process.  Urinalysis showed wbc's 7-10 and bacteria many.  Urine  drug screen was positive for benzo's and opiates, but she is taking  Valium and received opiates in the ED apparently and the drug screen was  performed de facto.  Her sed rate was mildly elevated at 40, but not at  the level that one would just act with temporal arteritis.  The patient  will be admitted for intractable headache.   PAST MEDICAL HISTORY:  1. Hypertension.  2. Hyperlipidemia.  3. Arthritis.  4. Chronic back pain.  5. Fibromyalgia.  6. Migraine headaches.  7. Diverticulitis.   PAST SURGICAL HISTORY:  1. C-section x1, cholecystectomy.  2. TAH-BSO for uterine tumor at age 28.  3. Right total knee arthroplasty, April 2009.  4. Left ankle fracture.  5. Pacemaker for bradycardia and hypotension.   SOCIAL HISTORY:  The  patient has never smoked.  The patient does not  drink.  She lives with her spouse.  She is on disability.   FAMILY HISTORY:  Father died from oral cancer and was a smoker at age  87.  Mother is alive at age 7 and has hypertension.  Brother died at  age 22 and had heart disease.   ALLERGIES:  PENICILLIN, MORPHINE, FLEXERIL, IVP DYE, IODINE CONTAINING  TORADOL, SHELLFISH, KEFLEX, FENTANYL, PREDNISONE.   MEDICATIONS:  1. Valium 10 mg at bedtime.  2. Zocor 20 mg at bedtime.  3. Senokot 1 tablet p.o. daily p.r.n.  4. Colestipol 1 g b.i.d.  5. Atenolol 12.5 mg daily.  6. Prevacid 30 mg p.o. daily p.r.n.  7. Excedrin Migraine daily.   REVIEW OF SYSTEMS:  The patient has tried Excedrin Migraine.  She  believes she has tried Depakote in the past for prophylaxis for  migraines.  It was not very effective.  She cannot recall any other  prophylactic medications for migraines.  The patient denies any  dizziness, vertigo, tinnitus, neck stiffness, negative for all 10-organ  systems except for pertinent positives stated above.   PHYSICAL EXAMINATION:  VITAL SIGNS:  Temperature 97.5, pulse 71, blood  pressure 154/89, respiratory rate 16, pulse ox 96% on room air.  HEENT:  Anicteric, EOMI, no nystagmus, pupils 1.5 mm, symmetric, direct,  consensual, near reflexes intact.  Mucous membranes moist.  NECK:  No JVD, no bruit, no thyromegaly, no adenopathy.  HEART:  Regular rate and rhythm.  S1 and S2.  No murmurs, gallops, or  rubs.  LUNGS:  Clear to auscultation bilaterally.  ABDOMEN:  Soft, obese, nontender, nondistended.  Positive bowel sounds.  EXTREMITIES:  No cyanosis, clubbing, or edema.  NEUROLOGIC:  Cranial nerves II through XII intact, reflexes 2+,  symmetric, diffuse with downgoing toes bilaterally, motion 5/5 in all 4  extremities, pinprick intact.  Skin:  No rashes.  LYMPH NODES:  No adenopathy.   LABORATORY DATA:  ESR 40.  Urine drug screen positive for  benzodiazepines as well  as opiates.  Urinalysis showed 7-10 wbc's and  many bacteria.  Sodium 144, potassium 3.8, chloride 111, BUN 9,  creatinine 0.6.   WBC 9.4, hemoglobin 13.4, platelet count 329.   CT scan negative for any acute process.   ASSESSMENT:  1. Headache, possibly migraine.  2. Hypertension.  3. Hyperlipidemia.  4. Gastroesophageal reflux disease.  5. Chronic pain.  6. Fibromyalgia.  7. Transient ischemic attack.  8. Urinary tract infection.   PLAN:  We will try Topamax at 25 mg p.o. at bedtime for migraine  prophylaxis.  We will try Triptans such as DVT, Sumatriptan 25 mg p.o.  daily p.r.n. migraine and can use higher doses if lower doses are not  ineffective.  We will  treat UTI with Cipro 500 mg p.o. b.i.d. x3 days.  We will continue the  Valium, Zocor, colestipol, atenolol for blood pressure, and cholesterol.  We will continue Prevacid 30 mg daily p.r.n. acid reflux for her.  We  will consult Neurology in the morning.  We appreciate very much their  input on her migraine headaches.      Massie Maroon, MD  Electronically Signed     JYK/MEDQ  D:  10/28/2008  T:  10/28/2008  Job:  [id number]   cc:   Dr. Emily Filbert

## 2011-01-31 NOTE — Consult Note (Signed)
NAMEALOHILANI, LEVENHAGEN              ACCOUNT NO.:  [id number]   MEDICAL RECORD NO.:  [id number]          PATIENT TYPE:  INP   LOCATION:  5524                         FACILITY:  [facility]   PHYSICIAN:  Michael L. Reynolds, M.D.DATE OF BIRTH:  1951/02/13   DATE OF CONSULTATION:  03/17/2009  DATE OF DISCHARGE:                                 CONSULTATION   HISTORY OF PRESENT ILLNESS:  This is a 60 year old Caucasian female who  initially was seen by [facility], Dr. Pearlean Brownie on November 11, 2008  for a hospital consult of headache.  At that time, the patient stated  that she had a headache on a daily basis.  Her headaches were located  over the vertex and occiput of her head.  She had photophobia,  phonophobia, nausea, vomiting.  Relieving factors were sitting in a dark  room, decreased sounds.  At that time, the patient was taking over-the-  counter medication and taking Fioricet on a daily basis.  Dr. Pearlean Brownie had  a long discussion with the patient about how narcotics can cause rebound  headache and the patient was taken off all narcotics and started on  Topamax 50 mg daily.  At this time, the patient stopped taking Topamax  and states that Topamax cause nausea and vomiting.  The patient had a  followup appointment with Dr. Pearlean Brownie, however, apparently missed her  first followup visit, was issued a letter, which was lost in the mail.  The patient called back to set a second followup visit, however, the  story becomes confusing at this time and she has been unable to see Dr.  Pearlean Brownie.  Since that date over the past 4 months, the patient has been in  the emergency department for 6 times for headache.  Each time she has  received Dilaudid and had felt significantly better and gone home.  The  patient states Dilaudid is the only medication at this time that  decreases her pain.  The patient states that Compazine, morphine,  Toradol all cause nausea and vomiting.  On this consultation, the  patient  states that she has been having approximately 8 headaches a  month on average and takes over-the-counter medications and Dilaudid  with these medications.  This visit, the patient started having a  headache on Saturday, which was located all over.  The patient states  she had no visual disturbances or hemiplegia with this headache.  She  states that the headache subsided over Sunday, continued through Monday  to the point which on Tuesday, she came to the emergency department.  The patient was evaluated in the ED, require several doses of IV  Dilaudid and Phenergan, which did not help her headache. ED physician  discuss with Dr. Sharene Skeans alternatives, one of which was IV Depacon.  The IV Depacon was started, but had to be discontinued secondary to  severe itching.  In the emergency room, the patient was complaining of  no nausea, photophobia, and phonophobia.   At time of consultation, the patient is sitting in her bed with all  lights on, TV on significantly loud.  The patient  stating she has 10/10  headache, but shows no signs of distress.  The patient is able follow  all commands without any difficulty, states she has no phonophobia, no  photophobia at this time.  The patient has received one dose of IV DHE  and states that this cause severe nausea, vomiting.   PAST SURGICAL HISTORY:  1. C-section.  2  Cholecystectomy.  1. Hysterectomy.  2. Bilateral salpingo-oophorectomy.   PAST MEDICAL HISTORY:  1. Hypertension.  2. High cholesterol.  3. Migraine.  4. Fibromyalgia.  5. Chronic neck pain.  6. Diverticulitis.  7. Pacemaker placement for bradycardia.   MEDICATIONS:  1. Valium 10 mg b.i.d.  2. Zocor 20 mg every night.  3. Senokot one tablet p.r.n.  4. Atenolol 12.5 mg daily.  5. Prevacid 30 mg daily.   She is now taking DHE protocol, which includes Reglan 10 mg prior to  administration of DHE for nausea and vomiting.  In addition, 4 mg of  Solu-Medrol and sliding scale  insulin.   ALLERGIES:  COMPAZINE, IV DYE, MORPHINE, TORADOL, and SHELLFISH.   SOCIAL HISTORY:  She does not smoke, drink or do illicit drugs.  She is  on disability along with her husband.  She apparently does activities of  daily living without any difficulty, drives a car, does all the shopping  and home making at home.   REVIEW OF SYSTEMS:  All negative with the exception of above.   PHYSICAL EXAMINATION:  GENERAL:  Blood pressure is 129/75, pulse 60,  respiratory rate 18, temperature 97.0.  PULMONARY:  Clear to auscultation bilaterally.  CARDIOVASCULAR:  S1, S2, regular rate and rhythm.  NECK:  Negative bruits and supple.  ABDOMEN:  Soft, nontender, nondistended.   NEUROLOGICAL EXAM:  The patient's mental status, she is alert and  oriented x3, carries out 2 and 3-step command.  She is in no distress.  Converses with well-enunciated speech and very relaxed.  Cranial nerves:  Pupils are equal, round, reactive accommodating to light, and conjugate  gaze.  She has no photophobia.  Extraocular muscles are intact.  Visual  fields are grossly intact.  No phonophobia.  Face is symmetrical.  Tongue is midline.  Uvula is midline.  Sensory V1 through V3 of the face  bilaterally is full.  Shoulder shrug, head turn is full.  Coordination:  Cerebellum, finger-to-nose, heel-to-shin is smooth.  Fine  motor movements are all within normal limits.  Motor and gait:  The patient's gait is slow, but is narrow based with  equal arm swing bilaterally.  She has 5/5 strength globally with good  tone, bulk, no tremors.  Deep tendon reflexes are 2+ throughout  bilateral downgoing toes.  The patient has no drift.  Sensation is full to pinprick, light touch, vibration, proprioception.  Stereognosis is grossly intact.   LABS:  Hemoglobin and hematocrit 13.0 and 38.6 respectively.  White  blood cell count is 6.2, platelets 260.  Sodium 143, potassium 3.6,  chloride 111, CO2 is 25, BUN 3, creatinine 0.55,  glucose 122.   IMAGING AND TEST:  CT of the head is negative.   ASSESSMENT AND RECOMMENDATIONS:  This is a 60 year old female with  chronic headache who has been seen in the emergency department 6 times  over the past 3 months.  The patient has tried Topamax, Depakote, and  Compazine which she states all cause nausea and vomiting.  The patient  is now on DHE protocol.  She has had one dose at this present  time.  She  is stating she has a 9/10 headache, but is sitting very comfortably and  relax in her bed.  At this time, we recommends to continue the DHE protocol and Reglan and  we will add 4 mg of Solu-Medrol prior to DHE administration.  At this  time, no further recommendations for Neurology.  We will discuss this  with Dr. Thad Ranger.     ______________________________  Felicie Morn, PA-C      Marolyn Hammock. Thad Ranger, M.D.  Electronically Signed    DS/MEDQ  D:  03/17/2009  T:  03/17/2009  Job:  [id number]   cc:   [facility]  [facility]

## 2011-01-31 NOTE — H&P (Signed)
[name], [name]              ACCOUNT NO.:  [id number]   MEDICAL RECORD NO.:  [id number]          PATIENT TYPE:  INP   LOCATION:  5506                         FACILITY:  [facility]   PHYSICIAN:  Carlena Hurl, MDDATE OF BIRTH:  01-09-1951   DATE OF ADMISSION:  11/08/2008  DATE OF DISCHARGE:                              HISTORY & PHYSICAL   CHIEF COMPLAINT:  Headache.   HISTORY OF PRESENT ILLNESS:  This is a 60 year old Caucasian female who  has a past medical history significant for migraines in the past, coming  in yesterday to the ER for severe headache.  The history was obtained  from the patient.  According to her, when she woke up yesterday morning  at 6 a.m., she noticed severe headache which was bilateral and also  present in the occipital side of the head.  She says that she tried some  medications at home, but she continued to have the headaches, and she  was also feeling very nauseous.  She denied having any vision changes at  that time, and she also thought that she is going to pass out because of  the severe headache.  She was admitted recently on October 27, 2008, for  similar complaints and was treated for migraines and was discharged 2  days later.  She says that she has been getting headaches very  frequently for the past 3 weeks on and off, and she tried taking over-  the-counter medications, Tylenol as well as ibuprofen, but at this time  as her headache did not improve, she decided to come to the ER.   PAST MEDICAL HISTORY:  Significant for hypertension, hyperlipidemia,  arthritis, history of migraines, chronic back pain, fibromyalgia,  diverticulitis.   PAST SURGICAL HISTORY:  C-section x1 and history of cholecystectomy,  total abdominal hysterectomy, bilateral salpingo-oophorectomy for  uterine tumor at age 20, right total knee arthroplasty in April 2009,  left knee fracture, and pacemaker for bradycardia, and hypertension.   SOCIAL HISTORY:  The  patient denies smoking, alcohol, IV drug abuse.  She lives with her spouse and the patient is on disability for the above-  mentioned medical issues.   FAMILY HISTORY:  Patient's father died from oral cancer and was a smoker  at age 63.  Mother is alive at age 44, and was hypertensive.  Brother  died at age of 44 and had heart problems.   ALLERGIES:  The patient is allergic to MORPHINE, PENICILLIN, FLEXERIL,  IV DYE, IODINE-CONTAINING TORADOL, SHELLFISH, KEFLEX, FENTANYL,  PREDNISONE.   MEDICATIONS:  The medication that the patient is on at home are:  1. Valium 10 mg p.o. at bedtime.  2. Zocor 20 mg p.o. at bedtime.  3. Senokot 1 tablet p.o. as needed.  4. Colestipol 1 g b.i.d.  5. Atenolol 12.5 mg p.o. one time a day.  6. Prevacid 30 mg p.o. one time a day.  7. Excedrin Migraine everyday.   REVIEW OF SYSTEMS:  Other than the headaches, the patient denies having  any other symptoms.   PHYSICAL EXAMINATION:  GENERAL:  This is a 60 year old Caucasian lady  who is lying on the bed with headache, but no severe chest pain or  shortness of breath.  VITAL SIGNS:  When she came into the ER, initial blood pressure 173/96,  later it dropped down to 139/88; pulse rate of 90, later dropped down to  82; respirations of 18; temperature 97.7.  HEENT:  Head is atraumatic, normocephalic.  Pupils, PERRLA.  Tympanic  membrane intact.  No discharge from eyes or ears.  LUNGS:  Clear to auscultation bilaterally.  CVS:  S1, S2 heard with regular rate and rhythm.  ABDOMEN:  Soft.  Bowel sounds present.  Nontender.  Nondistended.  EXTREMITIES:  No pedal edema noted.  Pulses palpable bilaterally.  No  cyanosis, no clubbing noted.  CNS:  Awake, alert, and oriented x3.  Cranial nerves II-XII intact.  No  focal neurological deficits noted.   LABORATORY DATA:  Sodium 144, potassium 3.6, chloride 106, bicarb of 25,  BUN of less than 3, creatinine of 0.7, hemoglobin 13.6, hematocrit 40.0,  ionized calcium  of 1.12.   ASSESSMENT AND PLAN:  This is a 60 year old lady with a history of  severe migraines in the past and who was admitted recently with similar  complaints coming in with severe headache which is bilaterally on the  frontal side and also on the back of her head, associated with nausea.  1. Acute migraine.  At this time, the patient was given valproate      while down in the ER, and she was also given one dose of Tylenol.      Initially when she came into the ER, her headache was 8-9/10, but      after receiving some medication in the ER, her headache has come      down to 5/10.  So at this time, we will start this patient on      sumatriptan 20 mg p.o. as needed for severe migraine and at the      same time, we will also continue this patient with Tylenol in      between the sumatriptan for headache.  The patient will also be      started on Topamax 25 mg p.o. at bedtime for migraine prophylaxis.  2. History of hypertension.  The patient's blood pressure has been      stable here and will continue her home dose of atenolol.  3. History of hyperlipidemia.  The patient will be continued on home      dose medications.  4. History of chronic back pain/arthritis, is stable now.  5. History of diverticulitis, is stable.  6. History of bradycardia with status post pacemaker.  The patient's      heart rate has been stable.  7. Deep venous thrombosis prophylaxis.  The patient will be placed on      Lovenox 40 mg subcu 1 time a day.  8. Gastrointestinal prophylaxis.  The patient will be placed on      Protonix 40 mg p.o. one time a day.      Carlena Hurl, MD  Electronically Signed     JD/MEDQ  D:  11/09/2008  T:  11/09/2008  Job:  [id number]

## 2011-01-31 NOTE — Consult Note (Signed)
[name], BOSSERMAN              ACCOUNT NO.:  [id number]   MEDICAL RECORD NO.:  [id number]          PATIENT TYPE:  INP   LOCATION:  1606                         FACILITY:  [facility]   PHYSICIAN:  Pramod P. Pearlean Brownie, MD    DATE OF BIRTH:  [birth year]   DATE OF CONSULTATION:  09/30/2007  DATE OF DISCHARGE:                                 CONSULTATION   REFERRING PHYSICIAN:  Ladell Pier, M.D.   REASON FOR REFERRAL:  Refractory migraine.   HISTORY OF PRESENT ILLNESS:  Ms. Height is a 60 year old Caucasian lady  who was admitted yesterday with a severe refractory headache for the  last 3 days.  The patient states the headache began on Friday during the  day.  It was initially moderate in severity, but gradually got worse  that night, and she woke up from sleep at 1:00 with severe 10 out of 10  headache in bifrontal head regions as well as extending up to the  vertex.  She describes the headache as pressure-like, behind the eyes,  but throbbing on the top of the head.  It was accompanied by significant  nausea.  She has also had light sensitivity and some sound sensitivity.  She has taken over-the-counter medicines which have not worked.  Since  admission she has received IV Dilaudid which seems to take the edge off,  but the headache __________ comes down only from 10 of 10 to 8 out of  10.  She has felt better with her nausea with medications as well.  She  takes Vicodin at home for back pain, which does not seem to be helping.  She denies any visual symptoms, speech problems or focal neurological  symptoms with her headaches.  She has prior past medical history of  similar headaches off and on since years.  The initial headaches were  much milder and were managed easily, but for the last 2 or 3 years the  headaches have increased in severity, though the frequency is still 2-3  times a year.  The headaches do last a couple of days now.  She has not  seen a neurologist for these  headaches.  She has no family history of  migraine headaches.  She does not remember having migraine headaches  when she was a young girl either.  She denies any other focal  neurological symptoms.  She had gone for an MRI scan today, but then  they found out she has a pacemaker and so a CT scan was done which  showed no acute abnormality.   PAST MEDICAL HISTORY:  Chronic back pain, fibromyalgia, sciatica,  hyperlipidemia, hypertension, gastroesophageal reflux disease.   PAST SURGICAL HISTORY:  Pacemaker placement, arthroscopic knee surgery,  C-section, gallbladder removal, hysterectomy.   FAMILY HISTORY:  Not significant for anybody with migraine headaches.  However, one of her brothers had a brain tumor.   SOCIAL HISTORY:  The patient is a nonsmoker, nonalcoholic.  She is  independent with activities of daily living.  She is currently on  disability.  She is married and lives with her husband.   MEDICATION ALLERGIES:  1. __________ .  2. MORPHINE.  3. KEFLEX.  4. FLEXERIL.  5. IV DYE WITH IODINE.  6. IMITREX MAKES HER HYPER.  7. TOPAMAX CAUSED STOMACH UPSET.   REVIEW OF SYSTEMS:  Significant for headache and nausea only.   PHYSICAL EXAMINATION:  GENERAL:  Physical exam reveals a pleasant middle-  aged Caucasian lady who seems in mild discomfort from her headache.  She  is lying in the room with the lights turned off because of light  sensitivity.  VITAL SIGNS:  Temperature 97.9, pulse rate 88 per minute regular,  respiratory rate 20 per minute, blood pressure was 79/42.  She has been  treated with IV bolus.  Distal pulses are well felt.  HEAD AND NECK:  Head is nontraumatic.  Neck is supple.  There is no  bruit.  ENT EXAM:  Unremarkable.  CARDIAC EXAM:  Regular heart sounds.  LUNGS:  Clear to auscultation.  ABDOMEN:  Soft, nontender.  NEUROLOGIC EXAM:  The patient is pleasant, awake, alert, cooperative.  There is no aphasia, apraxia or dysarthria.  Visual acuity and  fields  are adequate.  Eye movements have full range.  There is no nystagmus.  Face is symmetric.  Palatal movements are normal.  Tongue is midline.  Motor system exam reveals no upper extremity drift, symmetric strength,  tone, reflexes; right knee movements are limited due to pain.  Toes are  downgoing.  Gait was not tested.  Examination  of the neck reveals minor  muscle spasm in the posterior neck muscles, but full range of movement,  no deformity.   DATA REVIEWED:  CT scan of the head noncontrast study done yesterday  shows no acute abnormality in the brain.  Mild mastoid sinusitis changes  are noted.  Labs are pretty much unremarkable.  White count is minimally  elevated at 14.1.  Electrolytes are normal.   IMPRESSION:  A 60 year old lady with refractory headache for the last 3  days.  The headache certainly has features which are consistent with  refractory migraine, however, unusual features being relative late age  of onset and absence of family history.   PLAN:  The patient has demonstrated sensitivities and side effects with  Imitrex, morphine and Topamax in the past.  I would recommend a trial of  IV Depacon at least short-term, begin 2 grams now and then Depakote ER  500 mg a day.  Try Ultram 100 mg q.6 hours p.r.n. for symptomatic relief  as well, IV hydration with normal saline.  I will be happy to follow the  patient in consult.  Kindly call for questions.           ______________________________  Sunny Schlein. Pearlean Brownie, MD     PPS/MEDQ  D:  09/30/2007  T:  10/01/2007  Job:  [id number]

## 2011-01-31 NOTE — H&P (Signed)
[name], SWARM              ACCOUNT NO.:  [id number]   MEDICAL RECORD NO.:  [id number]         PATIENT TYPE:  LINP   LOCATION:                               FACILITY:  [facility]   PHYSICIAN:  Mobolaji B. Bakare, M.D.DATE OF BIRTH:  1951-04-07   DATE OF ADMISSION:  02/14/2008  DATE OF DISCHARGE:                              HISTORY & PHYSICAL   PRIMARY CARE PHYSICIAN:  Undersigned (primary care physician is Dr. Dustin Flock in [place]).   CHIEF COMPLAINT:  Weakness, nausea, vomiting, diarrhea for 1 week.   HISTORY OF PRESENTING COMPLAINT:  [name] is a 60 year old Caucasian  female with multiple medical problems as listed below.  She was in her  usual state of health until about a week ago when she developed nausea  and vomiting.  It became associated with diarrhea.  She moves her bowels  about four to six times a day.  She has subsequently developed lower  abdominal cramps, but these cramps are not related to the diarrhea.  She  gets these cramps without having to go to the bathroom.  She has no  hematochezia nor hematemesis.  She had a CT scan of the abdomen and  pelvis which was unremarkable for any colitis or acute abnormalities.  She denies dysuria, urgency, or increased frequency of micturition.  She  denies foul smelling urine as well.   She was in the emergency room with same symptoms of nausea, vomiting and  diarrhea 2 days ago.  She had initial workup, and she was diagnosed with  stomach bug, likely viral, and given symptomatic treatment.  Diarrhea  has stopped today.  However, last night, she developed chills, and she  checked her temperature but was normal.  She decided to come to the  emergency room today because of progressive weakness and persistent  nausea and vomiting.  She has been unable to keep any food down.   REVIEW OF SYSTEMS:  She has on and off headaches.  The patient has  history of migraine.  She denies sore throat.  No cough, shortness of  breath or chest pain.  The rest of review of systems as in the HPI and  otherwise negative.   PAST MEDICAL HISTORY:  1. Chronic back pain.  2. Chronic pain syndrome.  3. Fibromyalgia.  4. Hypertension.  5. Migraine headaches.  6. Right knee arthritis.  7. Gastroesophageal reflux disease.  8. Hypercholesterolemia.  9. Sciatica.  10.Cardiac pacemaker.   PAST SURGICAL HISTORY:  1. Recent right knee total replacement at [facility] 1 month ago.  2. Cesarean section.  3. Cholecystectomy in 2007.  4. Hysterectomy.  5. Orthopedic knee procedure.   CURRENT MEDICATIONS:  1. Aspirin 81 mg daily.  2. Depakote 500 mg daily.  3. Valium 10 mg every night.  4. Hydrocodone APAP 10/325 p.r.n.  5. Zocor 20 mg every night.  6. Senokot 1 p.r.n.  7. Robinul 2 mg b.i.d.  8. Colestipol 1 g b.i.d.  9. Atenolol 12.5 mg once a day.   ALLERGIES/DRUG INTOLERANCES:  FLEXERIL CAUSES NAUSEA.  KEFLEX MAKES HER  SICK TO HER STOMACH.  MORPHINE CAUSES URTICARIA.  PENICILLIN CAUSES  NAUSEA.  SHELLFISH CAUSES ANAPHYLAXIS.  TORADOL CAUSES NAUSEA.  IV  DYE/IODINE CONTRAST.   FAMILY HISTORY:  Mom is well and alive.  Dad passed away from mouth  cancer.   SOCIAL HISTORY:  The patient is on disability.  She is a nonsmoker.  She  does not drink alcohol, does not use any drugs of abuse.  She lives with  her husband.   INITIAL VITALS:  Temperature 97.6, blood pressure 126/62, pulse of 75,  respiratory rate of 20, O2 saturations of 95%.  On examination, the patient is awake, alert, and oriented to time, place  and person.  Normocephalic, atraumatic.  Pupils equal, round and reactive to light.  Extraocular muscle movements intact.  No elevated JVD.  No carotid  bruit.  Mucous membrane dry.  No oral thrush.  LUNGS:  Clear clinically to auscultation except left basilar rales.  CARDIOVASCULAR SYSTEM:  S1-S2 regular.  No murmur or gallop.  ABDOMEN:  Not distended, soft.  She has suprapubic and bilateral lower   quadrant tenderness without rebound or guarding.  No bowel sounds are  present.  No palpable organomegaly.  EXTREMITIES:  No pedal edema or calf tenderness.  She has a well-healed  right knee incision site.  CENTRAL NERVOUS SYSTEM:  No focal neurological deficits.  SKIN:  No rash or petechiae.   INITIAL LABORATORY DATA:  Done today, white cells 5.7, hemoglobin 11.4,  hematocrit 34.1, platelets 283.  Sodium 140, potassium 3.9, chloride  107, bicarb 25, BUN 12, creatinine 0.54.  AST 22, ALT 16, albumin 3.42,  protein 6.6, calcium 8.9, glucose 95.  Urinalysis showed specific  gravity of 1.006, large leukocyte esterase.  Microscopy showed white  blood cells of 21-50, few bacteria.  CT scan of the abdomen and pelvis  showed no acute abdominal findings, mild increasing basilar atelectasis,  and no acute pelvic findings.   ASSESSMENT AND PLAN:  [name] is a 60 year old Caucasian female  presenting with nausea, vomiting, diarrhea and progressive weakness.  She has no urinary symptoms.  However, UA suggestive of urinary tract  infection.  CT scan of the abdomen and pelvis is unremarkable.  She had  associated chills but no documented fever.  She will be admitted and  treated for urinary tract infection, nausea and vomiting, and diarrhea.   ADMISSION DIAGNOSIS:  1. Urinary tract infection.  Will start IV ciprofloxacin 400 mg q.12      h. and send urine for culture.  Further adjustment to antibiotics      depends on urine sensitivity.  2. Diarrhea, nausea and vomiting with unremarkable CT scan.  This is      probably related to urinary tract infection or viral etiology.  We      will rule out community-acquired clostridium difficile.  She denies      use of antibiotics in the past six months.  Will send stool for      clostridium difficile toxin, fecal leukocytes and culture, although      clostridium difficile is less likely.  Will continue symptomatic      treatment with Phenergan 12.5 mg  IV q.4 h. p.r.n. and Zofran 4 mg      IV q.8 h. p.r.n. if Phenergan is ineffective.  Will continue IV      fluid normal saline at 125 mL per hour with potassium supplement.  3. Chronic medical problems.  Resume home medications as before.      Mobolaji  Cheral Bay, M.D.  Electronically Signed     MBB/MEDQ  D:  02/14/2008  T:  02/14/2008  Job:  [id number]

## 2011-01-31 NOTE — H&P (Signed)
[name], [name]              ACCOUNT NO.:  [id number]   MEDICAL RECORD NO.:  [id number]          PATIENT TYPE:  INP   LOCATION:  1419                         FACILITY:  [facility]   PHYSICIAN:  Beckey Rutter, MD  DATE OF BIRTH:  [birth year]   DATE OF ADMISSION:  09/05/2007  DATE OF DISCHARGE:                              HISTORY & PHYSICAL   PRIMARY CARE PHYSICIAN:  Lucky Cowboy, M.D.   CHIEF COMPLAINT:  Abdominal pain.   HISTORY OF PRESENT ILLNESS:  This is a 60 year old female with multiple  medical problem presented with abdominal pain and diarrhea for several  weeks.  The patient stated that the pain is progressively worsening  together with diarrhea.  The patient had diarrhea 3-4 times a day for  the last 2 weeks or so.  She had some workup done at [facility] including CAT scan and colonoscopy, but that workup was  nonrevealing.  She also has history of C. diff colitis 2-1/2 years ago,  but this time she could not remember any antibiotic.  She also denied  any recent travel, sick family members or contact.  She admitted to have  some chills last night, but she did not record her temperature.  She  reports poor appetite and no vomiting, though she felt nauseated most of  the time.  The condition is associated with abdominal cramps.  Cramps  mainly in her central abdomen without significant radiation.  She could  not this describe a good relation with her diarrhea.  She also mentioned  she has some change in bowel movement, but she claims that is probably  because of her hemorrhoids which she knows she has.  She does not feel  that those tinges of blood are coming from inside with her bowel  movement.   PAST MEDICAL HISTORY:  1. Significant for hypertension.  2. Acid reflux disease.  3. Chronic back pain.  4. High cholesterol.  5. Irritable bowel syndrome.  6. Pacemaker.  7. Clostridium difficile gastrointestinal tract infection 2-1/2 years  ago.   PAST SURGICAL HISTORY:  1. Orthoscopic knee procedure.  2. Cesarean section.  3. Cholecystectomy.  4. Hysterectomy.   SOCIAL HISTORY:  Nonsmoker, nondrinker.  No history of drug abuse.  No  recent travel history.  No recent foreign travel by family or friends.   FAMILY HISTORY:  Noncontributory.   ALLERGIES:  1. IVP DYE WITH IODINE CONTAINING.  2. SHELLFISH.  3. PENICILLINS.  4. MORPHINE.  5. KEFLEX.  6. TORADOL.  7. FLEXERIL.   MEDICATIONS:  1. Atenolol 25 mg half tab daily.  2. Vicodin 5/500 p.r.n.  3. Valium 10 mg p.r.n.  4. Phenergan orally 25 mg p.r.n.  5. Zocor.  6. Robinul.   REVIEW OF SYSTEMS:  The patient admitted to have some headache this  morning.  Also she had some chills the other night.  The rest of review  of systems as per HPI.   PHYSICAL EXAMINATION:  GENERAL:  On exam, the patient was lying flat on  bed not in acute respiratory distress.  VITAL SIGNS:  Blood  pressure  126/66, pulse rate 64, respiratory rate 18, temperature is 98.1.  HEENT:  Head:  Atraumatic, normocephalic.  Eyes:  PERRL.  Mouth:  Moist.  No ulcer.  NECK:  Supple.  No JVD.  LUNGS:  Bilateral fair air entry.  HEART:  Precordium, first and second heart sounds audible.  No added  sounds.  The patient has a scar and left sided pacemaker.  ABDOMEN:  Nondistended.  Bowel sounds present.  Soft, no obvious  tenderness or guarding.  NEUROLOGICALLY:  She is alert and oriented x 3.  Moving all her  extremities spontaneously.  GENITOURINARY:  No CVA tenderness.  EXTREMITIES:  No lower extremity edema.   DIAGNOSTICS:  1. Abdominal x-ray showing impression of barium is present in the      distal colon.  By report, the patient had an upper GI examination      performed at the outside institution 17 days ago.  It would be      unusual for contrast to persistent in the distal colon for this      period of time outside the setting of ileus.  No dilated bowel to      suggest obstruction.   Bowel gas patterns appear otherwise      unremarkable. Pacer noted.  Low lung volumes.  2. EKG is pending.   LABORATORY DATA:  Urine showing yellow clear urine with a specific  gravity of 1003, nitrate is negative leukocyte esterase is negative.  Sodium is 136, potassium 3.7, chloride 101, bicarb 28, glucose 87, BUN  4, creatinine 0.70, albumin 3.3, AST 23, ALT 23, alkaline phosphatase is  110, bilirubin total is 0.7,  estimated GFR more than 60, white blood  count is 8.3, hemoglobin 2.5, hematocrit 36.3, platelet count is 301,  PTT 27, PT 13.2, INR is 1.0.   ASSESSMENT/PLAN:  1. This is a 60 year old with chronic complaint of diarrhea and      abdominal cramps with history also of irritable bowel syndrome.  At      this time, I cannot rule out clostridium difficile or other      infectious/parasitic agent.  The patient will be admitted for      further assessment and management.  We will consider a gentle      hydration.  I will keep the patient n.p.o.  We will obtain CT      abdomen with p.o. contrast.  We will send for stool workup      including C. diff and parasitology.  2. Hypertension.  I will continue on atenolol after the patient is      stable.  She is taking 12.5 mg daily.  Tomorrow, we will skip the      dose and evaluate depending on the blood pressure.  Further      management will be initiated.  3. Chronic back pain.  I will continue the Vicodin p.r.n.  4. Hyperlipidemia.  We will continue on Zocor.  5. For gastrointestinal condition, I will start the patient on      Protonix IV.  6. For deep venous thrombosis prophylaxis, I will start the patient on      Lovenox.  We will also consider GI consultation and GU course if      the patient's condition is not improving.      Beckey Rutter, MD  Electronically Signed     EME/MEDQ  D:  09/05/2007  T:  09/06/2007  Job:  [id number]

## 2011-01-31 NOTE — Discharge Summary (Signed)
[name], [name]              ACCOUNT NO.:  [id number]   MEDICAL RECORD NO.:  [id number]          PATIENT TYPE:  INP   LOCATION:  1412                         FACILITY:  [facility]   PHYSICIAN:  Marcellus Scott, MD     DATE OF BIRTH:  1951/01/20   DATE OF ADMISSION:  02/14/2008  DATE OF DISCHARGE:                               DISCHARGE SUMMARY   DATE OF DISCHARGE:  To be determined.   PRIMARY MEDICAL DOCTOR:  Dr. Marlow Baars in [place], [place].   ADDENDUM:  This is an addendum to the discharge summary that was done by  Dr. Lavera Guise on February 17, 2008 and will outline events since.   DISCHARGE DIAGNOSES:  1. Idiopathic abdominal pain.  Workup thus far, negative.  2. Chronic pain syndrome.  3. Fibromyalgia.  4. Chronic benzodiazepine use.  5. Enterococcus urinary tract infection - treated.  6. Relative adrenal insufficiency.  7. Migraine.  8. Status post permanent pacemaker.  9. Hypertension.  10.Hyperlipidemia.  11.Normocytic anemia, chronic and stable.   DISCHARGE MEDICATIONS:  To be dictated at discharge.   PROCEDURES:  Since the first of June.  1. Barium enema on June 4.  Impression:  There is an area of luminal      narrowing and haustral thickening of the distal sigmoid colon.      Findings may relate to infectious colitis given the patient's      history of recent diarrhea.  Inflammatory bowel disease is also a      consideration.  No discrete diverticulosis was seen in this region      to suggest diverticular disease.  Neoplasm is felt less likely and,      reportedly, the patient has had a colonoscopy within the last year.      Consider followup barium enema versus colonoscopy to ensure      resolution of these findings.  2. Flexible sigmoidoscopy.  Impression:  Grossly normal exam of the      distal colon without abnormalities to correlate with apparent      luminal narrowing seen on the recent barium enema.  That      abnormality is thus far thought to be  possibly due to muscular      thickening or perhaps even some localized spasm at the time of that      procedure.   PERTINENT LABS:  Basic metabolic panel on June 4, unremarkable with BUN  of 6, creatinine of 0.64.   CONSULTATIONS:  GI, Dr. Randa Evens.   HOSPITAL COURSE AND PATIENT DISPOSITION:  Since February 18, 2008, the  patient continued to complain of lower abdominal pain, but no vomiting.  She was nauseated.  She was constipated.  It was unclear what the cause  of her abdominal pain was.  The differential diagnoses included  enterococcal UTI, chronic abdominal pain, and fibromyalgia.  Despite  bowel regimens, the patient continued to remain constipated and in  significant abdominal pain.  Following this, GI was consulted.  They  proceeded to do a barium enema, which revealed an abnormality in the  sigmoid region.  They did  not think this was significant. She was also  placed on Librax, which the patient says has not made any significant  difference. However, the patient continued to be with pain following  which she had a flexible sigmoidoscopy today with findings as above.  I  have discussed her case in detail with Dr. Matthias Hughs today, who indicates  that, since the patient's abdominal pain remains idiopathic in nature,  she probably deserves consideration for either pain clinic or a  specialist center that specializes in functional chronic abdominal pain  as an outpatient.  The patient, however, indicates that since her  abdominal pain is not significantly better, and she is worried that she  will have flare-ups at home, she is very reluctant to go home.  At this  point, we will get a pain management consult in the hospital.  Also, may  need to consider psychiatric evaluation.      Marcellus Scott, MD  Electronically Signed     AH/MEDQ  D:  02/24/2008  T:  02/24/2008  Job:  [id number]   cc:   Dr. Tyna Jaksch, [facility] L. Malon Kindle., M.D.  Fax:  [phone]

## 2011-01-31 NOTE — Consult Note (Signed)
[name], Zimmerman NO.:  [id number]   MEDICAL RECORD NO.:  [id number]          PATIENT TYPE:  INP   LOCATION:  1419                         FACILITY:  [facility]   PHYSICIAN:  Graylin Shiver, M.D.   DATE OF BIRTH:  1951-01-09   DATE OF CONSULTATION:  09/09/2007  DATE OF DISCHARGE:                                 CONSULTATION   REASON FOR CONSULTATION:  The patient is a 60 year old female who was  admitted to the hospital because of abdominal pain.  She states that she  developed rather acute onset of right-sided abdominal pain 2 weeks  before Thanksgiving this year.  She sought attention from her medical  physicians in [place] and she had a CT scan done as well as a  colonoscopy at [facility].  There was no finding on  either CT scan or colonoscopy according to her that explained the pain  on the right side.  The patient did have her gallbladder out 3 years  ago.   The patient also states that she has a history of a peptic ulcer.  She  does not give any specific details on this.  She does have some  occasional epigastric discomfort and heartburn, particularly with spicy  foods.   A CT scan of the abdomen and pelvis was done on December 18.  No acute  findings were found in neither the abdomen or the pelvis to explain any  pain.   I spoke with the patient and she seemed to want an endoscopy because the  upper GI tract has not been evaluated.   PAST HISTORY:  1. Hypertension.  2. Acid reflux.  3. History of peptic ulcer disease.  4. Chronic back pain.  5. Hypercholesterolemia.  6. Irritable bowel.  7. History of pacemaker.  8. History of Clostridium difficile.   SURGERY:  1. C-section.  2. Cholecystectomy.  3. Hysterectomy.  4. Knee surgery in the past.   ALLERGIES:  1. IVP DYE.  2. SHELLFISH.  3. PENICILLIN.  4. MORPHINE.  5. KEFLEX.  6. TORADOL.  7. FLEXERIL.   MEDICATIONS PRIOR TO ADMISSION:  Atenolol, Vicodin, Valium,  Phenergan,  Zocor, Robinul.   SYSTEMS REVIEW:  No chest pain or shortness of breath.   PHYSICAL:  She is in no distress, nonicteric.  HEART:  Regular.  LUNGS:  Clear.  ABDOMEN:  Soft, no obvious tenderness.   IMPRESSION:  Right-sided abdominal pain, etiology unclear.  She is heme  negative.  CT scan is negative for any acute findings in abdomen or  pelvis.  Recent colonoscopy reported as negative.   PLAN:  Will proceed with an EGD to evaluate upper GI tract.  If this is  negative, I think she can go home and follow up with her primary care  physician and her gastroenterologist, who comes to [facility].           ______________________________  Graylin Shiver, M.D.     SFG/MEDQ  D:  09/09/2007  T:  09/10/2007  Job:  [id number]   cc:   [facility]

## 2011-01-31 NOTE — Consult Note (Signed)
[name], [name]              ACCOUNT NO.:  [id number]   MEDICAL RECORD NO.:  [id number]          PATIENT TYPE:  INP   LOCATION:  1412                         FACILITY:  [facility]   PHYSICIAN:  James L. Malon Kindle., M.D.DATE OF BIRTH:  Jun 02, 1951   DATE OF CONSULTATION:  02/19/2008  DATE OF DISCHARGE:                                 CONSULTATION   REASON FOR CONSULTATION:  Abdominal pain.   HISTORY:  Patient is a 60 year old woman who has had chronic abdominal  pain.  She has fibromyalgia and is a frequent flyer to the emergency  room.  Looking at the emergency room records, it appears that she is in  the [facility] emergency room three or four times a month with  headaches, abdominal pain, joint pains, etc.  She has had a recent knee  replacement in [place].  She has been seen by our service on one of the  admissions in December.  CT was normal.  She had right-sided pain.  We  did an endoscopy that was normal as well.  Biopsies were obtained for  celiac disease and these were normal.  The patient has been readmitted,  and at this point had abdominal pian, nausea and vomiting, and was found  to have urinary tract infection with enterococcus, and has been treated  to be discharged on Levaquin.  Unfortunately, she is still continuing to  have lower pain, cannot have a bowel movement, had no response to a  Fleet's enema.  Due to her chronic pain, the feeling that she was  hurting too much to go home, we are asked to see her.  She just had a CT  of the abdomen done again on this admission.  This revealed no acute  process in the abdomen.  Gallbladder has been removed.  The pancreas,  colon, etc., was okay.  Patient has had an endoscopy by myself, and  apparently a colonoscopy four or five years ago by a physician in  [place], that she reports was negative.   MEDICATIONS ON ADMISSION:  Aspirin, Depakote, Valium, hydrocodone,  Zocor, Senokot, Robinul, colestipol and atenolol.  She  is also currently  on Levaquin for the urinary tract infection.   SHE IS ALLERGIC OR INTOLERANT TO FLEXERIL, KEFLEX, MORPHINE, PENICILLIN,  SHELLFISH, TORADOL,AND IV CONTRAST.   MEDICAL HISTORY:  1. Chronic back pain.  2. Chronic pain syndrome.  3. Fibromyalgia.  4. Chronic migraine headaches.  5. Chronic arthralgias.  6. She has a history of reflux.  7. Has a pacemaker.   Surgeries include cholecystectomy, hysterectomy, several procedures on  her knee, C-section and a right total knee replacement a month ago.   FAMILY HISTORY:  Father died from head and neck cancer.  Mother is  living.   SOCIAL HISTORY:  She is on disability.  She lives with her husband.  Does not smoke or use drugs.   PHYSICAL EXAMINATION:  VITAL SIGNS:  All unremarkable.  GENERAL:  An alert female in no acute distress.  Eyes clear, nonicteric.  HEART:  Regular rate and rhythm without murmurs or gallops.  LUNGS:  Clear.  ABDOMEN:  Nondistended and soft.   ASSESSMENT:  Abdominal pain.  This appears to be somewhat acute pain  based on chronic pain.  It may simply be due to constipation and ileus  from urinary tact infections.  Seems to be hurting down lower.  She has  not had a good bowel movement.  I do not think she will be able to go  home and stay out of the hospital until her pain is improved and her  bowels are moving.   PLAN:  We will go ahead and order a barium enema with a routine prep  just to be sure that there is nothing wrong with the colon.  Following  the barium enema, we will keep her on MiraLax, prune juice chronically.           ______________________________  Llana Aliment Malon Kindle., M.D.     Waldron Session  D:  02/19/2008  T:  02/19/2008  Job:  [id number]   cc:   Royetta Asal  Fax: [phone]

## 2011-01-31 NOTE — Discharge Summary (Signed)
[name], [name]              ACCOUNT NO.:  [id number]   MEDICAL RECORD NO.:  [id number]          PATIENT TYPE:  INP   LOCATION:  1316                         FACILITY:  [facility]   PHYSICIAN:  Herbie Saxon, MDDATE OF BIRTH:  Feb 15, 1951   DATE OF ADMISSION:  05/25/2008  DATE OF DISCHARGE:  05/29/2008                               DISCHARGE SUMMARY   DISCHARGE DIAGNOSES:  1. Mild localized diverticulitis.  2. Acute on chronic abdominal pain.  3. Hypertension, stable.  4. Hyperlipidemia.  5. Complete heart block, status post pacemaker.  6. Chronic anemia.  7. History of fibromyalgia.  8. History of migraine.  9. Narcotic-seeking behavior.  10.Chronic benzodiazepine abuse.  11.Anxiety and depression.   RADIOLOGY:  A CT abdomen and pelvis of May 26, 2008, suggests mild  localized diverticulitis along the posterior parietal border of the  sigmoid colon.   HOSPITAL COURSE:  This 60 year old Caucasian female with history of  recurrent abdominal pain, nausea and vomiting, presented to the  emergency room complaining of increased nausea and vomiting and  inability to take p.o. food with left lower quadrant abdominal pain  radiating to the right lower quadrant.  Patient had mild clinical  dehydration on presentation.  She was admitted, made n.p.o.  No features  of GI bleeding noted.  Patient's pain has been adequately controlled  with IV Dilaudid and Zofran.  Patient was started on IV Cipro and Flagyl  and patient's symptoms have improved.  She is tolerating soft diet with  no nausea or vomiting.  Patient, however, continually seeks escalating  doses of Dilaudid, despite no objective evidence for the pain.  A family  member also called in to corroborate that she has been to eleven  hospitals in the last sixty weeks, [name] recently, and that  the family believes the patient is actively drug-seeking.   DISCHARGE CONDITION:  Stable.   DIET:  Will be  low-sodium, heart-healthy, low-cholesterol.  Advance diet  as tolerated.  She has been educated on this.   ACTIVITY:  No restrictions.   FOLLOWUP:  With her primary care physician, Dr. [name], within the next  sixty to sixty days.  He will coordinate GI followup in the next sixty to sixty weeks as necessary.   DISCHARGE MEDICATIONS:  1. Aspirin 81 mg daily.  2. Valium 10 mg at night.  3. Zocor 40 mg q.h.s.  4. Senokot one tablet p.r.n.  5. Colestipol 1 g b.i.d.  6. Atenolol 12.5 mg daily.  7. Cipro 500 mg twice daily for three days  8. Reglan 10 mg p.o. every 8 hours p.r.n.  9. Vicodin 5/500 one every 6 hours p.r.n.  10.Multivitamin one tablet daily.   Has been educated to hold aspirin if she has any dark stools.   EXAMINATION TODAY:  GENERAL:  She is an elderly lady, not in acute  distress.  VITAL SIGNS:  Temperature is 98, pulse is 62, respiratory rate is 20,  blood pressure 100/60.  HEENT:  Pupils are equal, reactive to light and accommodation.  HEAD:  Atraumatic, normocephalic.  NECK:  Supple.  Mildly  pale, no jaundice, no cyanosis or clubbing.  Oropharynx and nasopharynx are clear.  There is no adenopathy or  elevated JVD.  CHEST:  Clinically clear.  Heart sounds 1 and 2, regular rate and  rhythm.  ABDOMEN:  Benign.  No hepatomegaly.  Bowel sounds are normoactive.  NEUROLOGIC:  She is alert and oriented to time, place and person.  Cranial nerves, motor and sensory system grossly normal.  EXTREMITIES:  Peripheral pulses present, no pedal edema.   LABS:  WBC 4.6, hematocrit 32.5, platelet count is 238.  The chemistry  shows sodium of 140, potassium 3.9, chloride 107, bicarbonate 28,  glucose 103, BUN 2, creatinine 0.6.  Lipid profile showed total  cholesterol 247, triglyceride 155, LDL 182 and HDL 134.   Note that dose of Zocor was increased from 20 mg daily to 40 mg daily  since admission.  Thyroid function tests normal, lipase normal.  Discharge greater than 30  minutes.      Herbie Saxon, MD  Electronically Signed     MIO/MEDQ  D:  05/29/2008  T:  05/29/2008  Job:  [id number]   cc:   Dr. Dulce Sellar

## 2011-01-31 NOTE — Op Note (Signed)
[name], WINNING              ACCOUNT NO.:  [id number]   MEDICAL RECORD NO.:  [id number]          PATIENT TYPE:  INP   LOCATION:  1412                         FACILITY:  [facility]   PHYSICIAN:  Bernette Redbird, M.D.   DATE OF BIRTH:  12-25-1950   DATE OF PROCEDURE:  02/24/2008  DATE OF DISCHARGE:                               OPERATIVE REPORT   PROCEDURE:  Flexible sigmoidoscopy.   INDICATIONS:  A 60 year old female with previous negative colonoscopy  several years ago by her report, admitted to the hospital about 10 days  ago with multiple abdominal symptoms, including nausea, vomiting,  diarrhea and abdominal pain.  More recently, her complaint has been that  of persistent left lower quadrant pain and a barium enema performed  several days ago raised the question of some narrowing of the distal  sigmoid colon with apparent thickening of the haustral markings without  evident diverticular disease and without evidence of obstruction.   FINDINGS:  Normal exam to approximately 70 cm.   DESCRIPTION OF PROCEDURE:  The patient was brought from her hospital  room to the endoscopy unit following a Fleets enema prep, having  provided written consent.  The nature, purpose and risks of the  procedure were reviewed with the patient.   Sedation was Phenergan 12.5 mg, fentanyl 100 mcg and Versed 10 mg IV,  resulting in just mild sedation and no clinical instability.   The Pentax pediatric video colonoscope was advanced to approximately 70  cm of insertion and pullback was then performed.  At the time of  pullback, after withdrawal of loops, the level of insertion was about 40-  45 cm.  It was thought that I was probably in the midportion of the  descending colon and well above the area of questionable narrowing on  the recent barium enema.   There was a moderate amount of liquid stool in the colon, obscuring  moderate areas of the mucosa, especially more proximally.  Although  attempts  made to suction this, the amount of particulate matter  prevented that from being accomplished due to a significant degree.  Thus, small or focal abnormalities might well have been missed on this  exam, but it is not felt that any constricting lesions or diffuse  mucosal abnormalities or large masses would have been missed.   This was, within the limitations of the above description, and otherwise  normal exam.  No colitis, diverticular disease, polyps or masses were  observed.  There was no obvious luminal narrowing to advancement of the  scope.  There was no resistance to passage of the scope, apart from some  mild looping.  There was no evidence of friability or any kind of mass  effect.  I did not note any overt intestinal spasm.  Retroflexion in the  rectum was unremarkable.   No biopsies were obtained.  The patient tolerated the procedure well and  there no apparent complications.   IMPRESSION:  Grossly normal exam of the distal colon, without  abnormalities to correlate with the apparent luminal narrowing seen on  the recent barium enema.  That abnormality  is thus thought very possibly  to be due to muscular thickening or perhaps even some localized spasm at  the time of that procedure.   RECOMMENDATIONS:  Since the patient's abdominal pain remains idiopathic  in character, she probably deserves consideration for referral to either  a pain clinic or a medical center at that specializes in functional  chronic abdominal pain.           ______________________________  Bernette Redbird, M.D.     RB/MEDQ  D:  02/24/2008  T:  02/24/2008  Job:  [id number]   cc:   Dustin Flock, MD  [place], [place]

## 2011-01-31 NOTE — Consult Note (Signed)
[name], [name]              ACCOUNT NO.:  [id number]   MEDICAL RECORD NO.:  [id number]          PATIENT TYPE:  INP   LOCATION:  1412                         [facility]AC[facility]   PHYSICIAN:  Wilson Singer, M.D.DATE OF BIRTH:  1950/11/27   DATE OF CONSULTATION:  02/24/2008  DATE OF DISCHARGE:                                 CONSULTATION   Consult is for pain management recommendations.  This nurse  practitioner, Lorinda Creed, reviewed medical records, received report  from team, assessed the patient and then met at the patient's bedside to  discuss her pain history, previous attempts at pain management, pain  management approaches and options.   CHIEF COMPLAINT:  Abdominal pain.   PATIENT GOALS:  The patient reports that her goal is to return to her  previous state of health which has not been for many years due to  multiple chronic pain issues..   RECOMMENDATIONS:  1. The patient is advised to seek pain clinic on discharge.  2. Psychiatric evaluation strongly recommended.  This nurse      practitioner discussed with the patient the importance of a      holistic approach to pain management.  Mind, body, and spirit must      be addressed.  The value of a pain clinic and a pain physician      specialist was stressed.  The idea of personal accountability for a      patient's health and well-being was discussed at great length.  The      role that mental health plays in pain management was discussed.      The patient was encouraged to investigate and incorporate      alternative approaches other than medication into her pain      management plan, such as exercise, diet, and the power of positive      thinking.   HISTORY OF PRESENT ILLNESS:  [name] is a 60 year old white  female who was admitted on Feb 14, 2008 to the emergency room with  abdominal pain, nausea and vomiting.  The patient has a history of  seeking treatment in the local emergency room, sometimes as  often as 3  to 4 times a month.  She has had multiple workups.  An admission in  December revealed a normal CT.  Endoscopy was normal as well.  Biopsies  were obtained for celiac disease and these were normal.  The patient was  readmitted multiple times for the abdominal pain and nausea.  She  continues to complain of left lower abdominal pain.  Recent CT of the  abdomen showed no acute process.  The patient reports that her pain is  managed by her primary care physician, Dr. Ted Mcalpine, in [place],  [place].  She has never participated in a pain clinic.   PAST MEDICAL HISTORY:  Chronic back pain, chronic pain syndrome,  fibromyalgia, chronic migraine headaches, chronic arthralgias, history  of reflux.  She has a pacemaker.   PAST SURGICAL HISTORY:  Cholecystectomy, hysterectomy, knee replacement,  C-section.   FAMILY HISTORY:  Father died from head  and neck cancer.  Her mother is  living.  Otherwise noncontributory.   SOCIAL HISTORY:  She is on disability.  She denies tobacco or alcohol,  or illicit drug use.   REVIEW OF SYSTEMS:  As mentioned in HPI.  The patient also reports  chronic issues with constipation.  Denies urinary symptoms.  Denies  headache, dizziness.   PHYSICAL EXAM:  VITAL SIGNS:  106/64, temperature 97.7, pulse 59,  respirations 18, O2 sats on room air 98%.  GENERAL:  This is a non ill-appearing white female sitting up in bed  talking on the phone.  She appears in no acute distress.  HEAD:  Normocephalic, atraumatic.  EYES:  Pupils are equal and reactive to light.  MOUTH:  Mucous membranes are moist.  Clear.  No exudate noted.  LUNGS:  Clear bilaterally to auscultation.  HEART:  Regular rate and rhythm.  No murmur noted.  ABDOMEN:  Obese, soft, nondistended.  Positive bowel sounds.  Abdomen is  mildly tender in the left lower lobe.  No organomegaly noted.   LAB DATA:  Reviewed from June 4, sodium 139, potassium 3.5, chloride  100, CO2 29, glucose  127, BUN 6, creatinine 0.64, calcium 8.7, WBCs 5.9,  RBCs 3.96, hemoglobin 11.5, hematocrit 34.5.  Platelet count 285,000.    Total time spent on the unit was 90 minutes.  Time in 4:30.  Time out  5:55.  Counseling and coordination of care composed 50% or greater  portion of this interaction.  Again, concept of holistic approach to  pain management was discussed.  The importance of the patient seeking  pain clinic assistance was discussed.  Personal accountability was  stressed.  Dr. Waymon Amato was notified of the above consult      Herbert Pun, NP      Wilson Singer, M.D.  Electronically Signed    MCL/MEDQ  D:  02/25/2008  T:  02/25/2008  Job:  [id number]   cc:   [facility]   Marcellus Scott, MD   Wilson Singer, M.D.  Fax: [phone]

## 2011-01-31 NOTE — Op Note (Signed)
NAMEKIRAT, MEZQUITA              ACCOUNT NO.:  [id number]   MEDICAL RECORD NO.:  [id number]          PATIENT TYPE:  INP   LOCATION:  1419                         FACILITY:  [facility]   PHYSICIAN:  James L. Malon Kindle., M.D.DATE OF BIRTH:  1950/11/07   DATE OF PROCEDURE:  09/10/2007  DATE OF DISCHARGE:                               OPERATIVE REPORT   PROCEDURE:  Esophagogastroduodenoscopy and biopsy.   MEDICATIONS:  Fentanyl 100 mcg, Versed 10 mg IV, Phenergan 12.5 mg IV  and Cetacaine spray.   INDICATIONS:  Patient with persistent right-sided pain despite extensive  negative workup in [place] as well as here.   DESCRIPTION OF PROCEDURE:  The procedure had been explained to the  patient and consent obtained.  In the left lateral decubitus position,  the Pentax upper scope was inserted blindly into the esophagus.  The  esophagus was completely normal.  There was no significant hiatal  hernia.  The stomach was entered, pylorus identified and passed. The  duodenum was completely endoscopically normal.  Biopsies were obtained  for celiac disease. The scope was withdrawn back into the stomach.  The  antrum and body were normal.  The fundus and cardia seen well on the  retroflexed view and were normal. The scope was withdrawn.  The patient  tolerated the procedure well.   ASSESSMENT:  Right-sided abdominal pain with essentially normal  endoscopy.   PLAN:  Will feed the patient and follow her clinically check the  biopsies of the small bowel.           ______________________________  Llana Aliment. Malon Kindle., M.D.     Waldron Session  D:  09/10/2007  T:  09/10/2007  Job:  [id number]

## 2011-01-31 NOTE — Discharge Summary (Signed)
[name], [name]              ACCOUNT NO.:  [id number]   MEDICAL RECORD NO.:  [id number]          PATIENT TYPE:  INP   LOCATION:  1419                         FACILITY:  [facility]   PHYSICIAN:  Altha Harm, MDDATE OF BIRTH:  1950-12-14   DATE OF ADMISSION:  09/05/2007  DATE OF DISCHARGE:  09/11/2007                               DISCHARGE SUMMARY   DIAGNOSIS DISPOSITION:  Home.   FINAL DISCHARGE DIAGNOSES:  1. Intermittent abdominal pain.  2. Diarrhea, resolved.  3. Urinary tract infection.   SECONDARY DIAGNOSES:  1. Hypertension.  2. Gastroesophageal reflux disease.  3. Chronic back pain.  4. Hyperlipidemia.  5. Irritable bowel syndrome.  6. Status post pacemaker.  7. History of Clostridium difficile infection 2 1/2 years ago.  8. Arthroscopic knee procedure.  9. History of cesarean section.  10.History of cholecystectomy.  11.Hysterectomy.   DISCHARGE MEDICATIONS:  1. Atenolol 12.5 mg p.o. daily.  2. Valium 10 mg p.o. daily p.r.n.  3. Robinul 2 mg p.o. b.i.d.  4. Advicor 20/1000 mg p.o. daily.  5. Lovaza 1 g p.o. b.i.d.  6. Hydrocodone 10/325 one tab p.o. q.4-6 hours p.r.n.  7. Phenergan12.5mg  to 25 mg p.o. q.8 hours p.r.n.  8. Ciprofloxacin 500 mg p.o. b.i.d. x3 days.  9. MiraLax 17 g in 8 ounces of fluid daily.   CONSULTANTS:  Dr. Herbert Moors, gastroenterology.   PROCEDURE:  Esophagogastroduodenoscopy done on September 10, 2007, by Dr.  Randa Evens, which showed a normal endoscopy.  Biopsies pending.   DIAGNOSTIC STUDIES:  1. CT of the abdomen and pelvis without contrast, which shows no acute      intrapelvic findings and no acute intraabdominal findings.  2. X-ray of the abdomen done on the 18th on admission, which shows      barium presence in the distant colon.  By reports, the patient had      an upper GI examination performed at an outside institution 17 days      ago.  No dilated bowel to suggest obstruction.  Bowel gas pattern      otherwise  unremarkable.  Dual lead pacemaker noted.  3.Low volume.   CODE STATUS:  Full code.   ALLERGIES:  1. CONTRAST MEDIUM.  2. PENICILLIN.  3. MORPHINE SULFATE.  4. CEPHALOSPORINS.  5. KETOROLAC.  6. FLEXERIL.   PRIMARY CARE PHYSICIAN UNASSIGNED TO [place]:  Chair [facility] in  [place], [place].   CHIEF COMPLAINT:  Abdominal pain.   HISTORY OF PRESENT ILLNESS:  Please see H&P done by Dr. Christell Constant for  details of the HPI.   HOSPITAL COURSE:  Abdominal pain.  The patient presented complaining of  abdominal pain and diarrhea.  However, since her hospitalization, the  patient has not had any diarrhea.  The patient was initially given bowel  rest and her diet advanced to a heart-healthy diet, which she has been  tolerating without any difficulty.  The patient was seen by  gastroenterology, who was observed with the patient and proceeded with  an EGD for evaluation.  There were no physical findings found to explain  the patient's abdominal  pain.  The patient is known to have irritable  bowel syndrome and is usually [facility]  The patient has had a  colonoscopy on August 21, 2007.  As far as she knows, there have been  no significant findings on the colonoscopy.  The patient at this point  is stable.  She is tolerating diet.  She does have some intermittent  abdominal pain; however, it is tolerable and controlled with oral pain  medications.  The recommendation is for the patient to continue with  fiber and MiraLax and continue with her Robinul Forte.  The patient was  continued on her Protonix  for her gastroesophageal reflux disease.  The  patient will follow up with her primary care physician in 3-5 days.  At  this point the patient should follow up with the gastroenterologist here  at [facility] within 4-6 weeks.  However, she states that [facility] [place] [facility] [facility]  yet undecided  as to whom she is going to follow up with.   DIETARY RESTRICTIONS:  The patient should be on a high-fiber, low-  cholesterol, heart-healthy diet.   PHYSICAL RESTRICTIONS:  None.      Altha Harm, MD  Electronically Signed    MAM/MEDQ  D:  09/11/2007  T:  09/11/2007  Job:  [id number]

## 2011-01-31 NOTE — Discharge Summary (Signed)
[name], [name]              ACCOUNT NO.:  [id number]   MEDICAL RECORD NO.:  [id number]          PATIENT TYPE:  INP   LOCATION:  5524                         FACILITY:  [facility]   PHYSICIAN:  Herbie Saxon, MDDATE OF BIRTH:  1951-06-04   DATE OF ADMISSION:  03/17/2009  DATE OF DISCHARGE:  03/19/2009                               DISCHARGE SUMMARY   DISCHARGE DIAGNOSES:  1. Migraine headache, resolved.  2. Severe hypertension, improved.  3. History of chronic back pain.  4. History of fibromyalgia.  5. Hyperlipidemia.   RADIOLOGY:  The CT head of March 17, 2009, was negative for acute  intracranial process.   HOSPITAL COURSE:  This 60 year old Caucasian lady presented to the  emergency room with a complaints of severe headache.  The patient's case  was discussed with the neurologist who recommended starting IV Depacon.  However, the patient had allergic reaction to Depacon with severe  itching which responded to Benadryl.  Dr. Sharene Skeans further recommended  starting dihydro, a vitamin injection.  The patient was also noticed to  have a moderately elevated blood pressure and the home dose of atenolol  was increased initially to 12.5 twice daily and this has been further  titrated upwards.  The patient is stable.  She has been cleared for  discharge by Neurology.   DISCHARGE CONDITION:  Stable.   DIET:  2 g sodium, heart healthy, low cholesterol.   ACTIVITY:  Increase slowly as tolerated.   FOLLOWUP:  She will follow up with her primary care physician, Dr. Dustin Flock in [place] in the next 5-7 days.  Follow up with Dr. Pearlean Brownie,  Neurology, in the next 3-4 weeks.   MEDICATIONS ON DISCHARGE:  1. Valium 10 mg twice a day.  2. Zocor 20 mg at bedtime.  3. Senokot 1 tablet p.r.n.  4. Colestipol 1 g twice a day.  5. Atenolol 12.5 mg p.o. a.m. daily, atenolol 12.5 mg p.o. at bedtime      daily.  6. Prevacid 30 mg daily.  7. Tylenol 650 mg q.6 h. p.r.n.  8. Cafergot 2  tablets q.8 h. p.r.n. for migraine headache.   PHYSICAL EXAMINATION:  GENERAL:  Today, she is an elderly lady, not in  acute distress.  VITAL SIGNS:  Stable.  Temperature 98, pulse 60, respiratory rate is 16,  and blood pressure 150/90.  HEENT:  Pupils are equal and reactive to light and accommodation.  Head  is atraumatic and normocephalic.  Mucous membranes are moist.  NECK:  Supple.  No elevated JVD.  No thyromegaly.  No submandibular  lymphadenopathy.  No carotid bruits.  CHEST:  Clinically clear.  HEART:  Heart sounds 1 and 2, regular rate and rhythm.  No murmurs.  ABDOMEN:  Soft and nontender.  Bowel sounds are present.  NEUROLOGIC:  She is alert and oriented to time, place, and person.  Peripheral pulses are present.  No pedal edema.  No focal or acute  deficits.  No calf tenderness.   LABORATORY DATA:  ESR is 58.  Chemistry, sodium is 143, potassium 3.6,  chloride 111, bicarbonate 25, glucose  122, BUN 3, and creatinine 0.5.  WBC 6.2, hematocrit 38, and platelet count is 260.   DISCHARGE TIME:  Greater than 30 minutes.      Herbie Saxon, MD  Electronically Signed     MIO/MEDQ  D:  03/19/2009  T:  03/20/2009  Job:  [id number]

## 2011-01-31 NOTE — H&P (Signed)
NAMEPAYGE, EPPES              ACCOUNT NO.:  [id number]   MEDICAL RECORD NO.:  [id number]          PATIENT TYPE:  EMS   LOCATION:  ED                           FACILITY:  [facility]   PHYSICIAN:  Hind I Elsaid, MD      DATE OF BIRTH:  [birth year]   DATE OF ADMISSION:  09/29/2007  DATE OF DISCHARGE:                              HISTORY & PHYSICAL   PRIMARY CARE PHYSICIAN:  The patient is unassigned to our service.   CHIEF COMPLAINT:  Headache for three days.   HISTORY OF PRESENT ILLNESS:  This is a 60 year old Caucasian female with  a past medical history of migraine headache, chronic back pain, and  chronic abdominal pain being investigated recently by gastroenterologist  after EGD and colonoscopy.  There was no significant finding and  diagnosis was irritable bowel syndrome.  The patient is admitted today  to the hospital with chief complaint of mainly frontal headache that  started on Friday.  Headache was started gradually and severity was  3/10, mainly frontal, not radiating.  Headache was progressively getting  worse.  patient has to wake the patient yesterday at 1 a.m. with severe  headache.  The patient took hydrocodone and Benadryl with no significant  help of her pain.  The patient denies any vomiting, but admitted feeling  nauseated.  Denies any fever, denies any numbness or weakness of her  extremities.  Denies any blurring of vision.  The patient admitted  photophobia.  Denies any seizure activities.  The patient denies any  aura before the episode.  The patient admitted she usually has headache,  but not to this degree of pain.  The patient denies any meningismus  symptoms.  The patient denies any abdominal pain, denies any shortness  of breath, denies any numbness or weakness of her extremities.  Denies  any hematemesis or hematuria.   PAST MEDICAL HISTORY:  1. Migraine headache.  2. Sciatica.  3. Fibromyalgia.  4. Chronic back pain.  5. Hypercholesterolemia.  6.  Hypertension.  7. Status post pacemaker placement.  8. Gastroesophageal reflux disease.   PAST SURGICAL HISTORY:  1. Orthoscopic knee procedure.  2. Cesarean section.  3. Cholecystectomy.  4. Hysterectomy.  5. The patient has planned to have a right total knee replacement.   FAMILY HISTORY:  Father died with a history of cancer of the mouth.  Mother is still alive and denies any history of subarachnoid hemorrhage  or brain aneurysm.   SOCIAL HISTORY:  The patient is a nonsmoker and nondrinker.  No history  of drug abuse.  No recent travel  history.  She lives with her husband  and the patient is on disability.   ALLERGIES:  1. IV DYE WITH IODINE  2. SHELLFISH.  3. PENICILLIN.  4. MORPHINE  5. KEFLEX.  6. FLEXERIL.   MEDICATIONS:  1. Atenolol 25 mg 1/2 tablet daily.  2. Vicodin.  3. Valium 10 mg p.r.n.  4. Phenergan orally 25 mg p.r.n.  5. Zocor.  6. Propanol.  7. Colestipol 1 gram p.o. b.i.d.   PHYSICAL EXAMINATION:  VITAL SIGNS:  Temperature  98.6, blood pressure  126/86, pulse 92, respiratory rate 18, saturations 96% on room air.  HEENT:  Head normocephalic and atraumatic.  Pupils equal, round, and  reactive to light and accommodation.  Mucosa moist.  NECK:  No meningismus sign, no evidence of neck stiffness.  Supple with  no JVD.  LUNGS:  Bilateral fair airway.  HEART:  S1 and S2 with no added sounds.  ABDOMEN:  Nondistended.  Bowel sounds positive.  NEUROLOGY:  The patient is alert and oriented x3.  Moving all  extremities spontaneously.  There is no evidence of temporal area  tenderness.  Cranial nerves II-XII grossly intact.  EXTREMITIES:  No lower limb extremity edema.   LABORATORY DATA:  CBC 14.1, hemoglobin 13.7, hematocrit 38.9, platelets  295.  CMB; sodium 139, potassium 4.5, chloride 103, CO2 29, glucose 115,  BUN 7, creatinine 0.76.  Total bilirubin 1.2, alkaline phosphatase 140,  AST 39, ALT 27, total protein 8, albumin 3.6, and calcium 8.9.   Cardiac  markers; negative.  Urinalysis was negative.   CT of head; no acute brain finding.   ASSESSMENT:  1. This is a 60 year old female with a history of migraine headache      admitted with severe headache not responsive to pain medications.      Differential diagnosis most probably is attack of migraine      headache.  Although the picture seems atypical.  Unlikely any      evidence of subarachnoid hemorrhage taken in consideration the days      the patient's have pain is three days.  Plan; the patient is to be      admitted to the hospital, get MRI of the brain with MRA of head and      neck.  Get ESR.  Start the patient on antiemetic IV for mainly      chlorpromazine 25 mg IV q.6 hours and Dilaudid IV for pain control.      Also start the patient on some nonsteroidal anti-inflammatory      agents.  We will consult neurology in the a.m.  2. Leukocytosis.  Cause is unclear.  On my examination there is no      evidence of any meningismus signs.  We will monitor the white blood      cells, get urinalysis and culture and chest x-ray.  We will hold      off on antibiotic.  3. Mild elevated liver function tests.  I would repeat liver function      tests.  4. Neck pain.  We will get x-ray of the neck.  5. Hypertension.  Continue with home medication.  6. Deep venous thrombosis and gastrointestinal prophylaxis.   Further recommendations are to be addressed as inpatient.      Hind Bosie Helper, MD  Electronically Signed     HIE/MEDQ  D:  09/29/2007  T:  09/29/2007  Job:  [id number]

## 2011-01-31 NOTE — Discharge Summary (Signed)
[name], [name]              ACCOUNT NO.:  [id number]   MEDICAL RECORD NO.:  [id number]          PATIENT TYPE:  INP   LOCATION:  1302                         FACILITY:  [facility]   PHYSICIAN:  Lonia Blood, M.D.      DATE OF BIRTH:  May 13, 1951   DATE OF ADMISSION:  09/29/2007  DATE OF DISCHARGE:  10/09/2007                               DISCHARGE SUMMARY   PRIMARY CARE PHYSICIAN:  The patient was unassigned to [place].   DISCHARGE DIAGNOSES:  1. Intractable headache, presumed to be migraine.  2. Low cortisol level with negative workup.  3. Transient hypotension.  4. Transient hypoxia.   DISCHARGE MEDICATIONS:  Include aspirin 81 mg daily, Depakote 500 mg  daily, Valium 10 mg q.h.s., Percocet 10/325 one tablet q.6 h p.r.n.,  Zocor 20 mg q.h.s. and Senokot S 1 tablet b.i.d.   DISPOSITION:  The patient was to follow up with Dr. Leslie Dales 1 to 2  weeks after discharge.  Her headache seems to have improved at the time  of discharge.  Further treatment will be by her primary care physician.   CONSULTATIONS:  1. Neurology, Dr. Pearlean Brownie, with a diagnosis of refractory headaches.  2. Dr. Leslie Dales, endocrinology.   PROCEDURES PERFORMED:  1. X-ray of the C-spine on January 12 showed no evidence of acute bony      abnormality, facet arthropathy with right bony foraminal narrowing      at C3-4 and C4-5.  2. Chest x-ray on January 12 that showed low lung volumes with      bibasilar atelectasis.  Another chest x-ray on the same day showed      improved atelectasis at the bases.  3. Head CT without contrast on January 11 showed no evidence of      intracranial abnormality, small left mastoid effusion, chronicity      was uncertain.   BRIEF HISTORY AND PHYSICAL:  Please refer to dictated history and  physical by Dr. Ebony Cargo.  In short, however, the patient was a 60-  year-old female with history of migraine headache, chronic back pain who  came in complaining of severe headache for 3 days.   She reported no  blurred vision.  Denied any seizure activity.  She had tried some  hydrocodone and Benadryl, but her headache was getting progressively  worse; hence, she decided to come in.  She has a background history of  sciatica, fibromyalgia and chronic pain apparently.  The patient was  subsequently admitted for further management of chronic headaches.   HOSPITAL COURSE:  1. Intractable refractory headache.  The patient was admitted, started      on medical therapy. Resumed the  narcotics that she was taking at      home.  Neurology was consulted, and the patient was switched to      Depakote.  Per patient, the Depakote seems to be helping.  At the      time of discharge, it has improved her headache.  2. Hypertension.  Her blood pressure was refractory hypertension with      slight dizziness occasionally per patient.  A random a.m. cortisol      was checked and it was 0.9, which is low.  Endocrinology was      subsequently consulted, and a repeat random cortisol was 2.7, and      further testing did not indicate a cause.  The patient had a lot of      tests, including prolactin, IGF-1, free T4, ACTH level, etc.  Per      endocrinology, her chronic intake of narcotics could be the cause.      Otherwise, most of her tests came back within normal.  Somatomedin-      C insulin  growth factor was low at 28.  ACTH was within normal.      Her a.m. cortisol was later normalized.  Thyroid peroxidase      antibody was normal.  Her prolactin level was also normal.  It was      felt therefore that the patient's symptoms were likely refractory,      and she will continue to follow with Dr. Leslie Dales as an outpatient      for further management.  Her blood pressure, however, normalized      and was 120/73 on the day of discharge with just plain IV fluids.  3. Back pain.  Again, this is chronic.  The patient was asked to avoid      narcotics as much as possible.  She has background  fibromyalgia,      which also complicates matters.  The patient was discharged to      follow with her primary care physician as well as Dr. Leslie Dales.      Lonia Blood, M.D.  Electronically Signed     LG/MEDQ  D:  12/11/2007  T:  12/11/2007  Job:  [id number]

## 2011-01-31 NOTE — Discharge Summary (Signed)
NAMEJILLENE, Zimmerman              ACCOUNT NO.:  [id number]   MEDICAL RECORD NO.:  [id number]          PATIENT TYPE:  INP   LOCATION:  5506                         FACILITY:  [facility]   PHYSICIAN:  Lonia Blood, M.D.DATE OF BIRTH:  1951-03-13   DATE OF ADMISSION:  10/27/2008  DATE OF DISCHARGE:                               DISCHARGE SUMMARY   PRIMARY CARE PHYSICIAN:  Dr. Dustin Flock in [place].   DISCHARGE DIAGNOSES:  1. Intractable migraine headache.  2. Intractable nausea with inability to tolerate p.o. secondary to      above.  3. Status post pacemaker placement for bradycardia.  4. Total abdominal hysterectomy/ bilateral salpingo-oophorectomy.  5. History of left ankle fracture with surgical correction.  6. Status post right total knee arthroplasty in 2009.  7. Status post C-section times one.  8. Hypertension.  9. Hyperlipidemia.  10.Gastroesophageal reflux disease.  11.Anxiety disorder.  12.Migraine headaches.  13.Urinary tract infection.   DISCHARGE MEDICATIONS:  1. Zocor 40 mg p.o. q.h.s.  2. Colestipol 1 gram p.o. q.12h.  3. Valium 10 mg daily.  4. Atenolol 12.5 mg daily.  5. Prevacid over-the-counter p.r.n.  6. Topamax 25 mg q.h.s.  7. Imitrex  25 mg as needed for migraine headache, may repeat times      one 2 hours after initial dose if headache continues.   FOLLOW UP:  The patient is advised to follow her primary care physician  in [place] for ongoing medical care.  She is advised to be seen  within 3-5 days and states that this will not be difficult for her to  range.   PROCEDURES:  None.   CONSULTATIONS:  None.   HOSPITAL COURSE:  Ms. [name] is a 60 year old female who was  admitted to the acute unit after she had presented to the ER with an  intractable migraine.  This was complicated by the fact that  unfortunately she was not able to tolerate p.o. intake due to severe  intractable nausea associated with her migraine.  She was  placed in the  acute unit.  She was placed on IV.  She was placed on IV fluid for  support given her inability to tolerate p.o..  Various narcotic and  narcotic analgesic agents were used to attempt to interrupt the  patient's migraine.  Ultimately the patient's migraine did resolve.  It  seemed to have responded primarily to Imitrex dosing.  With such the  patient's diet was able to be advanced.  The patient tolerated  advancement of her diet and therefore prolonged hospital stay was no  longer indicated.  During the patient's hospitalization, a urinary tract  infection was detected.  This was noted on urinalysis.  Urine culture,  however, failed to reveal  the offending pathogen.  The patient was not  severely symptomatic.  The patient was therefore treated with a full 3-  day course of oral Cipro which she tolerated well.  Further antibiotic  therapy is not felt to be indicated.      Lonia Blood, M.D.  Electronically Signed     JTM/MEDQ  D:  10/31/2008  T:  10/31/2008  Job:  [id number]   cc:   Dustin Flock, M.D.

## 2011-01-31 NOTE — H&P (Signed)
NAMEJANELY, [name]              ACCOUNT NO.:  [id number]   MEDICAL RECORD NO.:  [id number]          PATIENT TYPE:  INP   LOCATION:  1316                         [facility]ACILITY[facility]   PHYSICIAN:  Eduard Clos, MDDATE OF BIRTH:  04-11-1951   DATE OF ADMISSION:  05/26/2008  DATE OF DISCHARGE:                              HISTORY & PHYSICAL   PRIMARY CARE PHYSICIAN:  Unassigned.   CHIEF COMPLAINT:  Abdominal pain.   HISTORY OF PRESENT ILLNESS:  A 60 year old female with history of  recurrent abdominal pain, nausea and vomiting, history of complete heart  block on pacemaker, hypertension, hyperlipidemia, presented with  complaint of abdominal pain.  The patient states that she has been  having abdominal pain over the last one month with nausea and vomiting.  For the last few days, her vomiting and nausea increased in intensity  with some blood in her stools.  She decided to come to the ER.  The  patient denies any chest pain, shortness of breath, weakness or  complaint of dizziness, loss of consciousness, headache, fever, chills,  dysuria.  The patient's abdominal pain is mostly localized to the left  lower quadrant  and sometimes radiating all the way  up to the right  lower quadrant.  Vomiting and diarrhea does not have any relief, is not  related to food.  The patient was recently seen on May 15, 2008, when  she had a CT scan that was normal and did not show any acute findings.  At this time, the patient is being admitted for further workup and  management of her intractable nausea and vomiting and abdominal pain.   PAST MEDICAL HISTORY:  1. Hypertension.  2. Complete heart block on pacemaker.  3. Hyperlipidemia.  4. Chronic abdominal pain.   PAST SURGICAL HISTORY:  1. Cholecystectomy.  2. Knee replacement.   MEDICATIONS PRIOR TO ADMISSION:  1. Aspirin 81 mg p.o. daily.  2. Valium 10 mg p.o. at bedtime.  3. Zocor 20 mg p.o. daily.  4. Senokot one tablet as  needed.  5. Colace 100 mg twice daily.   ALLERGIES:  FENTANYL, FLEXERIL, IV DYE, TAPE, SHELLFISH, TORADOL.   FAMILY HISTORY:  Noncontributory.   SOCIAL HISTORY:  The patient denies smoking cigarettes, drinking alcohol  or using illegal drugs.   REVIEW OF SYSTEMS:  As per history of present illness.  Nothing else  significant.   PHYSICAL EXAMINATION:  GENERAL:  The patient is lying in the bed, no  acute distress.  VITAL SIGNS:  Blood pressure 128/85, pulse 20 per minute.  HEENT:  Nonicteric, no pallor.  CHEST:  Bilateral air entry present.  No rhonchi.  No crepitation.  HEART:  S1 S2 heard.  ABDOMEN:  Nontender.  Bowel sounds heard.  Soft.  No guarding or  rigidity.  CNS:  Alert and oriented to time, place and person.  Moves upper and  lower extremities 5/5.  EXTREMITIES:  No focal deficits.  Peripheral pulses felt.   LABORATORY DATA:  CBC:  WBC 7.9, hemoglobin 13.5, hematocrit 40.3,  platelets 239, neutrophil 54%.  Comprehensive metabolic panel:  Sodium  141, potassium 3.8, chloride 105, carbon dioxide 26, glucose 81, BUN 4,  creatinine 0.4, alkaline phosphatase 116, AST 35, ALT 27, total protein  7.3, albumin 3.5, calcium 9, lipase 16.  UA trace leukocytes, wbc's 0-3.   ASSESSMENT:  1. Intractable nausea, vomiting and abdominal pain.  Etiology not      clear.  2. Dehydration.  3. History of hyperlipidemia.  4. History of hypertension.  5. History of pacemaker placement.   PLAN:  Admit patient to the medical floor.  Will make patient n.p.o.  Place on IV fluids and further recommendations as the patient's  condition evolves.      Eduard Clos, MD  Electronically Signed     ANK/MEDQ  D:  05/26/2008  T:  05/26/2008  Job:  [id number]

## 2011-01-31 NOTE — Discharge Summary (Signed)
[name], [name]              ACCOUNT NO.:  [id number]   MEDICAL RECORD NO.:  [id number]          PATIENT TYPE:  INP   LOCATION:  1412                         FACILITY:  [facility]   PHYSICIAN:  Michaela I Elsaid, MD      DATE OF BIRTH:  1950-11-27   DATE OF ADMISSION:  02/14/2008  DATE OF DISCHARGE:  02/26/2008                               DISCHARGE SUMMARY   PRIMARY CARE PHYSICIAN:  Dr. Sheliah Plane in [place], [place].   DISCHARGE DIAGNOSES:  1. Nausea and vomiting and diarrhea, resolved, felt to be possibly      gastroenteritis.  2. Idiopathic abdominal pain, workup negative, possibly chronic pain      syndrome.  3. Chronic pain syndrome.  4. Fibromyalgia.  5. Chronic benzodiazepine use.  6. History of upper respiratory tract infection, treated.  7. Migraine.  8. Status post permanent pacemaker.  9. Hypertension.  10.Hyperlipidemia.   DISCHARGE MEDICATIONS:  1. Aspirin 81 mg daily.  2. Levbid 500 mg daily.  3. Valium 10 mg at bedtime.  4. Zocor 20 mg every night.  5. Senokot one tablet p.r.n.  6. colestipol1 g b.i.d.  7. Advair.  8. Hydrocortisol tapered dose.  9. Reglan 10 mg p.o. q.8 h p.r.n.   HOSPITAL COURSE:  1. The patient continued to complain of abdominal pain with nausea.      Vomiting completely resolved.  Diarrhea completely resolved.      Palliative and hospice care consultations were appreciated where      they recommend to follow [facility].      The patient had hip x-ray for evaluation of any other bone      abnormality.  Could be a cause of her pain.  Hip x-ray was      completely negative.  Will repeat urinalysis and culture which were      significantly negative.  Was felt that the patient's abdominal pain      could be related to her chronic pain syndrome and fibromyalgia.      Discussed with the patient about important to follow up with pain      management clinic and have physician diagnose her with  fibromyalgia.  At this time, the patient tolerated regular diet      very well.  No evidence of dehydration were noted.  We felt the      patient can be discharged home with some supportive medication      including antiemetic medications.  2. Relative adrenal insufficiency.  Previously, blood work was done      regarding her urine cortisol and random cortisol level which was      low in January.  ACTH stimulation test was done which was not      significant.  Repeat random cultures all were within normal.  Case      was discussed with Dr. Leslie Dales, the endocrinologist, to see her      from before where he recommended the patient did not have any      evidence of adrenal insufficiency, and Hydrocortisol would be  tapered accordingly.  The patient asked to follow further cortisol      level with her primary care physician.  Accordingly, the patient      will get Hydrocortisol tapered dose.  3. Hypertension.  Blood pressure remained under good control during      hospitalization.   DISPOSITION:  The patient also received physical therapy and OT and it  was then recommended patient can be discharge home without OT and PT at  home.  Accordingly, the patient will be discharged home.  The patient  was asked to follow with her primary care physician, to repeat random  cortisol level, and how to follow her generalized pain.  Also, the  patient gets the address for pain management clinic.      Michaela Bosie Helper, MD  Electronically Signed     HIE/MEDQ  D:  02/26/2008  T:  02/26/2008  Job:  [id number]

## 2011-01-31 NOTE — Consult Note (Signed)
NAMESHANITRA, PHILLIPPI              ACCOUNT NO.:  [id number]   MEDICAL RECORD NO.:  [id number]          PATIENT TYPE:  INP   LOCATION:  5506                         FACILITY:  [facility]   PHYSICIAN:  Pramod P. Pearlean Brownie, MD    DATE OF BIRTH:  1951-02-15   DATE OF CONSULTATION:  DATE OF DISCHARGE:                                 CONSULTATION   REFERRING PHYSICIAN:  [facility].   REASON FOR REFERRAL:  Severe headache.   HISTORY OF PRESENT ILLNESS:  [name] is a 60 year old Caucasian lady  who has had new onset, almost daily headaches over the last 3-4 weeks.  She states the headaches come on suddenly at times.  At times, it  gradually buildup.  They involve the vertex as well as the occiput and  occasionally the side of the head.  They are severe in intensity 9/10,  accompanied nausea, occasional vomiting, and significant light and sound  sensitivity.  Headache is relieved to some extent by eating cold food,  use ice packs, and lying down.  She started taking over-the-counter  medications like Tylenol, ibuprofen, and Migraine Excedrin accident  which have not helped.  She has been in the emergency room a couple of  times.  She had a CT scan of the head done on this admission which did  not show any acute abnormality.  She denies any prior history of  migraine headaches or similar headaches in the past.  She has been  taking medications like Migraine Excedrin and Fioricet on an almost  daily basis in the last few weeks and she is having the headaches.  She  has had no prior history of any focal neurological symptoms, loss of  vision, slurred speech, extremity weakness, numbness with these  headaches.   Past medical history is significant for hypertension, hyperlipidemia,  arthritis, back pain, fibromyalgia, diverticulitis.  History of migraine  is listed in the chart though the patient categorically denied this to  me.   PAST SURGICAL HISTORY:  C-section, cholecystectomy,  abdominal  hysterectomy, bilateral salpingo-oophorectomy for uterine tumor at age  1, right knee arthroplasty, left knee fracture, pacemaker for  bradycardia, hypertension.   SOCIAL HISTORY:  The patient lives with his spouse.  She is on  disability.  She does not smoke or drink.   Family history is not significant for migraines or other vascular  problems in the brain.   MEDICATION ALLERGIES:  MORPHINE, PENICILLIN, FLEXERIL, IV DYE, IODINE,  TORADOL, SHELLFISH, KEFLEX, FENTANYL, PREDNISONE.   HOME MEDICATIONS:  Valium, Zocor, Senokot, colestipol, atenolol,  Prevacid, Migraine Excedrin.   REVIEW OF SYSTEMS:  As documented above in the history of present  illness.   PHYSICAL EXAMINATION:  GENERAL:  An obese middle-aged Caucasian lady who  is at present not in distress.  VITAL SIGNS:  She is afebrile, pulse rate is 70 per minute and regular,  blood pressure 114/77, and oxygen sat is 96% on room air.  HEAD:  Nontraumatic.  NECK:  Supple.  She has minor muscle tenderness in the posterior neck.  but no restriction of movements or stiffness.  CARDIAC:  Regular heart sounds.  No murmur, rub, or gallop.  NEUROLOGIC:  She is pleasant, awake, alert, cooperative.  There is no  aphasia, apraxia, dysarthria.  Eye movements are full.  Face is  symmetric.  Palatal movements are normal.  Tongue is midline.  Motor  system exam reveals no upper extremity drift.  She has symmetric  strength, tone, reflexes, coordination, and sensation.  Gait was not  tested.   DATA REVIEWED:  CT scan of the head on admission is unremarkable and  shows no acute abnormalities.  CBC, basic chemistries, TSH, and  hemoglobin A1c are normal.   IMPRESSION:  A 60 year old lady with severe refractory almost daily  headaches for the last 3-4 weeks.  The headache has some features of  vascular and analgesic rebound headache.  Absence of prior history of  migraines and family history of migraine is highly unusual for  migraine  headaches as they not present so late in life.  Evaluation for organic  causes and structure or vascular lesions in the brain needs to be done.   PLAN:  I would recommend checking CT angiogram of the brain and CT  venogram to evaluate for vascular abnormalities or venous sinus  thrombosis.  The patient is allergic to the IV dye and will need to be  premedicated for this.  Also evaluate for meningitis though the clinical  suspicion is low.  Trial of Topamax 50 mg a day for a week and increase  if tolerated twice a day.  Tylenol #3 p.r.n. for symptomatic headache  relief.  Discontinue Migraine Excedrin, Fioricet, and all over-the-  counter analgesics in order to reduce rebound headaches.  We will be  happy to follow the patient in consult.  Kindly call for questions.            ______________________________  Sunny Schlein. Pearlean Brownie, MD     PPS/MEDQ  D:  11/11/2008  T:  11/12/2008  Job:  [id number]

## 2011-01-31 NOTE — Discharge Summary (Signed)
[name], [name]              ACCOUNT NO.:  [id number]   MEDICAL RECORD NO.:  [id number]          PATIENT TYPE:  INP   LOCATION:  1412                         FACILITY:  [facility]   PHYSICIAN:  Lonia Blood, M.D.       DATE OF BIRTH:  Aug 09, 1951   DATE OF ADMISSION:  02/14/2008  DATE OF DISCHARGE:  02/17/2008                               DISCHARGE SUMMARY   PRIMARY CARE PHYSICIAN:  Royetta Asal, MD   DISCHARGE DIAGNOSES:  1. Relative adrenal insufficiency.  2. Abdominal pain.  3. Chronic back pain.  4. Fibromyalgia.  5. Hypertension.  6. Status post knee replacement.  7. Gastroesophageal reflux disease.  8. Status post pacemaker placement.  9. Hyperlipidemia.  10.Status post cholecystectomy.  11.Status post hysterectomy.  12.Enterococcus urinary tract infection.   DISCHARGE MEDICATIONS:  1. Aspirin 81 mg daily.  2. Depakote 500 mg daily.  3. Valium 10 mg at bedtime.  4. Hydrocodone 80 mg for pain.  5. Zocor 20 mg at bedtime.  6. Hydrocortisone 10 mg twice a day.  7. Levaquin 500 mg daily for three days.   CONDITION ON DISCHARGE:  [name] is discharging in good condition  and instructed to followup with her primary care physician within a  week.   PROCEDURES ON THIS ADMISSION:  On Feb 14, 2008, the patient underwent CT  scan of abdomen and pelvis which was essentially negative for any acute  pathology.   CONSULTATIONS:  No consultation obtained.   PHYSICAL EXAMINATION:  Refer to dictated H&P which was done by Dr.  Corky Downs on Feb 14, 2008.   HOSPITAL COURSE:  1. Enterococcus urinary tract infection. [name] was placed on      regular floor. She was treated empirically with intravenous      ciprofloxacin and upon final results of her culture she was      switched to oral Levaquin. Throughout this hospitalization, the      patient has remained afebrile without any signs of sepsis.  2. Chronic abdominal pain. I cannot claim to understand why the      patient is  having abdominal pain. She said that she had some      diarrhea prior to this admission, but she did not have any while in      the hospital. In fact, she did not have any bowel movements since      admission. The patient did require around the clock treatment with      Dilaudid intravenously.  3. Relative adrenal insufficiency. Upon reviewing the patient's      records from her previous admission back in January of 2009, it      appears to me that she may have a componenet of  adrenal      insufficiency. I have started her on hydorcortisone replacement      therapy and the patient needs to followup with an endocrinologist.  4. Hyperlipidemia. We have continued the patient's Zocor without      changes.      Lonia Blood, M.D.  Electronically Signed     SL/MEDQ  D:  02/17/2008  T:  02/17/2008  Job:  [id number]   cc:   Royetta Asal  Fax: [phone]   Mobolaji B. Corky Downs, M.D.

## 2011-01-31 NOTE — Discharge Summary (Signed)
NAMESYBIL, SHRADER              ACCOUNT NO.:  [id number]   MEDICAL RECORD NO.:  [id number]          PATIENT TYPE:  INP   LOCATION:  5506                         FACILITY:  [facility]   PHYSICIAN:  Isidor Holts, M.D.  DATE OF BIRTH:  [birth year]   DATE OF ADMISSION:  11/08/2008  DATE OF DISCHARGE:  11/12/2008                               DISCHARGE SUMMARY   PMD:  Dr. Dustin Flock, [place], [place].  Patient is unassigned  to [place].   DISCHARGE DIAGNOSES:  1. 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  2. Hypertension.  3. Dyslipidemia.  4. Gastroesophageal reflux disease.  5. History of osteoarthritis/chronic back pain.  6. Fibromyalgia syndrome.   DISCHARGE MEDICATIONS:  1. Valium 10 mg p.o. nightly.  2. Zocor 20 mg p.o. nightly.  3. Senokot 1 p.o. p.r.n. daily.  4. Colestipol 1 gram p.o. b.i.d.  5. Atenolol 12.5 mg p.o. daily.  6. Prevacid 30 mg p.o. daily.  7. Topamax 50 mg p.o. daily until November 17, 2008, then 50 mg p.o.      b.i.d. from November 18, 2008.  8. Tylenol with Codeine #3, one pill p.r.n. q.6 hourly. A total of 28      pills have been dispensed.   Note: Imitrex, Exedrine Migraine and NSAIDS, have been discontinued.   PROCEDURES:  1. Chest x-ray dated November 09, 2008.  This showed low lung volumes      and mild atelectasis.  2. Fluoroscopic-guided lumbar puncture done November 12, 2008.  This      was a successful L3-L4 lumbar puncture with collection of 8 mL of      clear cerebrospinal fluid.   CONSULTATIONS:  1. Dr. Delia Heady, neurologist.  2. Dr. Lacy Duverney, interventional radiologist.   ADMISSION HISTORY:  As in history and physical notes of November 08, 2008, dictated by Dr. Carlena Hurl.  However, in brief, this  is a 60 year old female, with known history of hypertension,  dyslipidemia, GERD, osteoarthritis, fibromyalgia syndrome, status post  cesarean section x1, history of  cholecystectomy, status post total  abdominal hysterectomy/bilateral salpingo-oophorectomy for uterine tumor  at age 23 years, status post right total knee arthroplasty April 2009,  status post pacemaker for bradyarrhythmia, history of chronic back pain  and diverticulosis, presenting with severe headache of new onset which  commenced in the morning of November 07, 2008.  Reportedly, patient had  a prior history of migraine headache and she did try some medications at  home which were not very helpful.  She became nauseated, described the  headache as being bitemporal and also affecting the occipital region.  On detailed questioning, it appears that she had been getting headaches  very frequently for the past 3 weeks on and off and she has taken over-  the-counter medications including Tylenol as well as Ibuprofen, without  improvement.  She was actually seen in the emergency department on  October 27, 2008, admitted, treated and discharged 2 days later.  Because of escalating and persisting symptoms, she came to the emergency  department.  She was subsequently admitted  for further evaluation,  investigation and management.   CLINICAL COURSE:  1. Headaches.  For details of presentation, refer to admission history      above.  The patient was commenced on p.r.n. Tylenol #3.  ESR was      done and was normal at 45-mm per hour.  Neurology consultation was      requested, which was kindly provided by Dr. Delia Heady. For      details of his consultation, refer to consultation notes of      November 11, 2008.  He has opined that patient likely has new onset      analgesic rebound headache/possible mixed vascular etiology.  He      recommended lumbar puncture, which was done, and which showed an      opening pressure of 10-mm of water, which was normal, WBC was 1,      RBC was 3, other cells were rare, total protein was 40, glucose 65,      i.e. unremarkable CSF.  The patient tolerated the  procedure well.      This was performed by Dr. Lacy Duverney, interventional radiologist.      Dr. Pearlean Brownie has recommended managing the patient with Topamax in an      initial dose of 50 mg daily until November 17, 2008, and then increase      to 50 mg p.o. b.i.d.  He will see the patient subsequently in his      office.  The patient responded to above management measures and by      November 12, 2008, was practically asymptomatic. Dr Pearlean Brownie, did      recommend cerebral angiogram and venogram, to evaluate for vascular      malformations, but patient declined these procedures, as she      apparently has a history of anaphylaxis to intravenous iodinated      contrast media and intolerance to steroids, militating against      premedication.   1. Hypertension.  This was controlled throughout the course of      patient's hospitalization, on pre-admission beta blocker treatment.   1. Dyslipidemia.  The patient's lipid profile was as follows:  Total      cholesterol 272, triglyceride 230, HDL 31, LDL 195.  The patient      was pre-admission on Zocor and Colestipol, these have been      continued.  We shall defer monitoring of patient's lipid profile      and adjustment of medications, to patient's primary MD.   1. Fibromyalgia syndrome.  This did not prove problematic during the      course of this hospitalization.   1. GERD.  The patient was managed with proton pump inhibitor.   1. Chronic back pain.  This did not prove problematic.   DISPOSITION:  The patient was on November 12, 2008, considered  clinically stable for discharge.  She was, therefore, discharged  accordingly.   DIET:  Heart-healthy.   ACTIVITY:  As tolerated.  Recommended to increase activity slowly.   FOLLOW-UP INSTRUCTIONS:  The patient is recommended to follow up with  her primary MD, Dr. Dustin Flock, [place], [facility], per prior  scheduled appointment.  The patient has been instructed to call for an  appointment.   In addition, patient has been arranged follow-up with Dr.  Delia Heady, [facility], telephone number [phone].  The neurology office will contact patient to schedule a date and time  for  appointment.      Isidor Holts, M.D.  Electronically Signed     CO/MEDQ  D:  11/12/2008  T:  11/12/2008  Job:  [id number]   cc:   Pramod P. Pearlean Brownie, MD  Dustin Flock, M.D.

## 2011-06-08 LAB — CBC
HCT: 38.9
Hemoglobin: 11.2 — ABNORMAL LOW
Hemoglobin: 13.7
MCHC: 32.1
MCHC: 34.3
MCHC: 35.3
MCV: 85.3
MCV: 85.6
Platelets: 295
Platelets: ADEQUATE
RBC: 3.82 — ABNORMAL LOW
RBC: 4.56
RDW: 13.1
RDW: 13.7
RDW: 13.9
WBC: 14.1 — ABNORMAL HIGH

## 2011-06-08 LAB — DIFFERENTIAL
Basophils Absolute: 0.1
Basophils Relative: 1
Eosinophils Absolute: 0.1
Eosinophils Relative: 1
Lymphocytes Relative: 27
Lymphs Abs: 3.8
Monocytes Absolute: 1
Monocytes Relative: 7
Neutro Abs: 9.1 — ABNORMAL HIGH
Neutrophils Relative %: 64
Smear Review: ADEQUATE

## 2011-06-08 LAB — COMPREHENSIVE METABOLIC PANEL WITH GFR
CO2: 29
Calcium: 8.9
Creatinine, Ser: 0.76
GFR calc Af Amer: >60
GFR calc non Af Amer: >60
Glucose, Bld: 115 — ABNORMAL HIGH

## 2011-06-08 LAB — URINALYSIS, ROUTINE W REFLEX MICROSCOPIC
Bilirubin Urine: NEGATIVE
Glucose, UA: NEGATIVE
Hgb urine dipstick: NEGATIVE
Nitrite: NEGATIVE
Nitrite: NEGATIVE
Specific Gravity, Urine: 1.004 — ABNORMAL LOW
pH: 5.5
pH: 7

## 2011-06-08 LAB — BLOOD GAS, ARTERIAL
Bicarbonate: 26.5 — ABNORMAL HIGH
Drawn by: 280981
O2 Content: 2
pCO2 arterial: 54.6 — ABNORMAL HIGH
pH, Arterial: 7.306 — ABNORMAL LOW
pO2, Arterial: 87.5

## 2011-06-08 LAB — MAGNESIUM: Magnesium: 2.3

## 2011-06-08 LAB — COMPREHENSIVE METABOLIC PANEL
ALT: 21
ALT: 27
AST: 39 — ABNORMAL HIGH
Albumin: 3.6
Alkaline Phosphatase: 140 — ABNORMAL HIGH
BUN: 7
CO2: 31
Calcium: 8.2 — ABNORMAL LOW
Chloride: 103
Creatinine, Ser: 0.61
GFR calc Af Amer: >60
GFR calc non Af Amer: >60
Glucose, Bld: 108 — ABNORMAL HIGH
Potassium: 4.5
Sodium: 139
Sodium: 141
Total Bilirubin: 1.2
Total Protein: 6.2
Total Protein: 8

## 2011-06-08 LAB — PROTIME-INR: Prothrombin Time: 14.1

## 2011-06-08 LAB — URINE CULTURE: Colony Count: 6000

## 2011-06-08 LAB — HEMOGLOBIN A1C: Hgb A1c MFr Bld: 5.5

## 2011-06-08 LAB — POCT CARDIAC MARKERS
CKMB, poc: <1 — ABNORMAL LOW
Myoglobin, poc: 60.1
Operator id: 2725
Troponin i, poc: <0.05

## 2011-06-08 LAB — CULTURE, BLOOD (ROUTINE X 2): Culture: NO GROWTH

## 2011-06-08 LAB — THYROID PEROXIDASE ANTIBODY: Thyroperoxidase Ab SerPl-aCnc: <25 U/mL

## 2011-06-08 LAB — BASIC METABOLIC PANEL
BUN: 3 — ABNORMAL LOW
CO2: 30
Calcium: 8 — ABNORMAL LOW
GFR calc non Af Amer: >60
Glucose, Bld: 88

## 2011-06-08 LAB — FOLLICLE STIMULATING HORMONE: FSH: 16.7

## 2011-06-08 LAB — CORTISOL: Cortisol, Plasma: 2.7

## 2011-06-08 LAB — CORTISOL-AM, BLOOD: Cortisol - AM: 10

## 2011-06-08 LAB — T4, FREE: Free T4: 1.06

## 2011-06-08 LAB — PHOSPHORUS: Phosphorus: 3.1

## 2011-06-08 LAB — CORTISOL, URINE, FREE: Volume, Urine-CORTUR: 2375

## 2011-06-08 LAB — INSULIN-LIKE GROWTH FACTOR: Insulin-Like GF-1: 28 ng/mL — ABNORMAL LOW (ref 92–190)

## 2011-06-08 LAB — D-DIMER, QUANTITATIVE: D-Dimer, Quant: 0.41

## 2011-06-08 LAB — ACTH: C206 ACTH: 16

## 2011-06-08 LAB — URINE MICROSCOPIC-ADD ON

## 2011-06-08 LAB — PROLACTIN: Prolactin: 50.6

## 2011-06-08 LAB — ACTH STIMULATION, 3 TIME POINTS: Cortisol, Base: 1.2

## 2011-06-09 LAB — BASIC METABOLIC PANEL
CO2: 25
Calcium: 8.6
Chloride: 105
Glucose, Bld: 110 — ABNORMAL HIGH
Sodium: 136

## 2011-06-09 LAB — BASIC METABOLIC PANEL WITH GFR
BUN: 4 — ABNORMAL LOW
Creatinine, Ser: 0.63
GFR calc Af Amer: >60
GFR calc non Af Amer: >60
Potassium: 3.5

## 2011-06-14 LAB — CBC
HCT: 34.5 — ABNORMAL LOW
Hemoglobin: 11.5 — ABNORMAL LOW
MCHC: 33.3
MCHC: 33.3
MCV: 86.7
MCV: 87.2
Platelets: 285
RBC: 3.93
RBC: 3.96
RDW: 14.4
RDW: 14.5
WBC: 5.9

## 2011-06-14 LAB — BASIC METABOLIC PANEL WITH GFR
BUN: 4 — ABNORMAL LOW
CO2: 22
Calcium: 9
Chloride: 102
Creatinine, Ser: 0.63
GFR calc non Af Amer: >60
Glucose, Bld: 109 — ABNORMAL HIGH
Potassium: 3.6
Sodium: 135

## 2011-06-14 LAB — DIFFERENTIAL
Basophils Absolute: 0
Basophils Relative: 0
Eosinophils Absolute: 0
Eosinophils Relative: 0
Lymphocytes Relative: 27
Lymphs Abs: 1.6
Monocytes Absolute: 0.3
Monocytes Relative: 5
Neutro Abs: 3.9
Neutrophils Relative %: 67

## 2011-06-14 LAB — URINALYSIS, ROUTINE W REFLEX MICROSCOPIC
Bilirubin Urine: NEGATIVE
Bilirubin Urine: NEGATIVE
Glucose, UA: NEGATIVE
Glucose, UA: NEGATIVE
Hgb urine dipstick: NEGATIVE
Ketones, ur: NEGATIVE
Nitrite: NEGATIVE
Protein, ur: NEGATIVE
Specific Gravity, Urine: 1.005
Specific Gravity, Urine: 1.006
Urobilinogen, UA: 0.2
Urobilinogen, UA: 0.2
pH: 6.5
pH: 7

## 2011-06-14 LAB — URINE MICROSCOPIC-ADD ON

## 2011-06-14 LAB — URINE CULTURE: Colony Count: >=100000

## 2011-06-14 LAB — COMPREHENSIVE METABOLIC PANEL WITH GFR
ALT: 16
AST: 22
Albumin: 3.4 — ABNORMAL LOW
Alkaline Phosphatase: 102
BUN: 2 — ABNORMAL LOW
CO2: 25
Calcium: 8.9
Chloride: 107
Creatinine, Ser: 0.54
GFR calc non Af Amer: >60
Glucose, Bld: 97
Potassium: 3.9
Sodium: 140
Total Bilirubin: 0.6
Total Protein: 6.6

## 2011-06-14 LAB — BASIC METABOLIC PANEL
BUN: 2 — ABNORMAL LOW
CO2: 27
Calcium: 8.8
Chloride: 106
Creatinine, Ser: 0.61
GFR calc Af Amer: >60
GFR calc non Af Amer: >60
Glucose, Bld: 90
Potassium: 3.3 — ABNORMAL LOW
Sodium: 141

## 2011-06-15 LAB — BASIC METABOLIC PANEL
BUN: 6
BUN: 7
Calcium: 8.7
Creatinine, Ser: 0.53
Creatinine, Ser: 0.64
GFR calc non Af Amer: >60
GFR calc non Af Amer: >60
Glucose, Bld: 127 — ABNORMAL HIGH

## 2011-06-15 LAB — COMPREHENSIVE METABOLIC PANEL
AST: 17
BUN: 5 — ABNORMAL LOW
CO2: 29
Calcium: 8.7
Chloride: 102
Creatinine, Ser: 0.61
GFR calc Af Amer: >60
GFR calc non Af Amer: >60
Glucose, Bld: 79
Total Bilirubin: 0.4

## 2011-06-15 LAB — URINALYSIS, ROUTINE W REFLEX MICROSCOPIC
Ketones, ur: NEGATIVE
Nitrite: NEGATIVE
Specific Gravity, Urine: 1.005
Urobilinogen, UA: 0.2
pH: 7

## 2011-06-15 LAB — CBC
HCT: 31.6 — ABNORMAL LOW
MCHC: 33.8
MCV: 85.8
Platelets: 234
RBC: 3.68 — ABNORMAL LOW
RDW: 14.2
WBC: 5.7

## 2011-06-15 LAB — URINE CULTURE: Special Requests: NEGATIVE

## 2011-06-19 LAB — DIFFERENTIAL
Basophils Absolute: 0
Basophils Relative: 1
Eosinophils Absolute: 0.1
Eosinophils Absolute: 0.1
Eosinophils Relative: 1
Lymphocytes Relative: 31
Lymphs Abs: 2.7
Monocytes Absolute: 0.6
Monocytes Relative: 7
Monocytes Relative: 7
Neutro Abs: 4.7
Neutrophils Relative %: 57

## 2011-06-19 LAB — COMPREHENSIVE METABOLIC PANEL
ALT: 14
ALT: 19
AST: 20
Albumin: 3.4 — ABNORMAL LOW
Alkaline Phosphatase: 106
BUN: 4 — ABNORMAL LOW
CO2: 25
Calcium: 8.4
Chloride: 108
Creatinine, Ser: 0.52
GFR calc Af Amer: >60
Glucose, Bld: 99
Potassium: 4.2
Sodium: 139
Sodium: 139
Total Bilirubin: 0.7
Total Protein: 7.2

## 2011-06-19 LAB — URINE MICROSCOPIC-ADD ON

## 2011-06-19 LAB — CBC
HCT: 39.8
Hemoglobin: 13.3
MCHC: 33.2
MCV: 87.4
Platelets: 311
RBC: 4.35
RBC: 4.56
WBC: 8.3
WBC: 8.6

## 2011-06-19 LAB — URINALYSIS, ROUTINE W REFLEX MICROSCOPIC
Bilirubin Urine: NEGATIVE
Glucose, UA: NEGATIVE
Hgb urine dipstick: NEGATIVE
Nitrite: NEGATIVE
Specific Gravity, Urine: 1.013
pH: 6

## 2011-06-21 LAB — COMPREHENSIVE METABOLIC PANEL
ALT: 17
ALT: 21
ALT: 24
AST: 26
AST: 27
AST: 35
Albumin: 3.1 — ABNORMAL LOW
Alkaline Phosphatase: 116
Alkaline Phosphatase: 86
Alkaline Phosphatase: 98
BUN: 2 — ABNORMAL LOW
BUN: 4 — ABNORMAL LOW
CO2: 24
CO2: 26
CO2: 28
Calcium: 7.6 — ABNORMAL LOW
Calcium: 8.1 — ABNORMAL LOW
Calcium: 8.4
Chloride: 105
Creatinine, Ser: 0.49
GFR calc Af Amer: >60
GFR calc Af Amer: >60
GFR calc non Af Amer: >60
GFR calc non Af Amer: >60
GFR calc non Af Amer: >60
Glucose, Bld: 103 — ABNORMAL HIGH
Glucose, Bld: 97
Potassium: 3.4 — ABNORMAL LOW
Potassium: 3.8
Potassium: 3.9
Sodium: 140
Sodium: 141
Sodium: 141
Total Bilirubin: 0.7
Total Protein: 6.4

## 2011-06-21 LAB — URINALYSIS, ROUTINE W REFLEX MICROSCOPIC
Bilirubin Urine: NEGATIVE
Glucose, UA: NEGATIVE
Hgb urine dipstick: NEGATIVE
Nitrite: NEGATIVE
Specific Gravity, Urine: 1 — ABNORMAL LOW
Specific Gravity, Urine: 1.004 — ABNORMAL LOW
Urobilinogen, UA: 0.2
pH: 7

## 2011-06-21 LAB — DIFFERENTIAL
Basophils Absolute: 0
Basophils Relative: 0
Eosinophils Relative: 1
Eosinophils Relative: 1
Lymphocytes Relative: 31
Lymphs Abs: 2.5
Metamyelocytes Relative: 0
Monocytes Absolute: 0.3
Myelocytes: 0
Neutro Abs: 5.1
nRBC: 0

## 2011-06-21 LAB — LIPID PANEL
HDL: 34 — ABNORMAL LOW
Total CHOL/HDL Ratio: 7.3
Triglycerides: 155 — ABNORMAL HIGH

## 2011-06-21 LAB — CBC
HCT: 32.5 — ABNORMAL LOW
HCT: 40.3
Hemoglobin: 11 — ABNORMAL LOW
Hemoglobin: 12.4
Hemoglobin: 13.5
MCHC: 33.2
MCHC: 33.7
MCHC: 33.9
MCV: 85.5
Platelets: 238
RBC: 3.79 — ABNORMAL LOW
RBC: 3.82 — ABNORMAL LOW
RBC: 4.71
RDW: 14.8
WBC: 6.6
WBC: 7.9

## 2011-06-21 LAB — URINE MICROSCOPIC-ADD ON

## 2011-06-21 LAB — GLUCOSE, CAPILLARY
Glucose-Capillary: 76
Glucose-Capillary: 84
Glucose-Capillary: 88

## 2011-06-21 LAB — LIPASE, BLOOD: Lipase: 16

## 2011-06-21 LAB — TSH: TSH: 2.335

## 2011-06-23 LAB — PROTIME-INR
INR: 0.9
INR: 1
Prothrombin Time: 13.2

## 2011-06-23 LAB — COMPREHENSIVE METABOLIC PANEL
ALT: 23
ALT: 35
AST: 21
AST: 23
AST: 29
Albumin: 3.3 — ABNORMAL LOW
Alkaline Phosphatase: 111
CO2: 27
CO2: 30
Calcium: 8.3 — ABNORMAL LOW
Calcium: 8.7
Calcium: 8.7
Chloride: 106
Creatinine, Ser: 0.7
GFR calc Af Amer: >60
GFR calc Af Amer: >60
GFR calc non Af Amer: >60
GFR calc non Af Amer: >60
Glucose, Bld: 87
Potassium: 4.2
Sodium: 136
Sodium: 141
Sodium: 141
Total Bilirubin: 0.6
Total Protein: 6.2
Total Protein: 7

## 2011-06-23 LAB — URINALYSIS, ROUTINE W REFLEX MICROSCOPIC
Bilirubin Urine: NEGATIVE
Glucose, UA: NEGATIVE
Hgb urine dipstick: NEGATIVE
Ketones, ur: NEGATIVE
Ketones, ur: NEGATIVE
Nitrite: NEGATIVE
Nitrite: NEGATIVE
Protein, ur: NEGATIVE
Specific Gravity, Urine: 1.003 — ABNORMAL LOW
Specific Gravity, Urine: 1.005
Urobilinogen, UA: 0.2
Urobilinogen, UA: 0.2
pH: 6.5
pH: 7

## 2011-06-23 LAB — CULTURE, BLOOD (ROUTINE X 2): Culture: NO GROWTH

## 2011-06-23 LAB — CBC
HCT: 36.3
Hemoglobin: 11.7 — ABNORMAL LOW
Hemoglobin: 11.7 — ABNORMAL LOW
Hemoglobin: 12.5
MCHC: 34.1
MCHC: 34.4
MCHC: 34.4
MCV: 85.7
MCV: 86.1
Platelets: 301
RBC: 3.95
RBC: 3.97
RBC: 4.03
RBC: 4.23
RDW: 13.3
WBC: 6.1
WBC: 6.4
WBC: 8.3

## 2011-06-23 LAB — PHOSPHORUS: Phosphorus: 4.4

## 2011-06-23 LAB — COMPREHENSIVE METABOLIC PANEL WITH GFR
ALT: 23
Alkaline Phosphatase: 110
BUN: 4 — ABNORMAL LOW
CO2: 28
Chloride: 101
Glucose, Bld: 87
Potassium: 3.7
Total Bilirubin: 0.7

## 2011-06-23 LAB — DIFFERENTIAL
Basophils Absolute: 0.1
Basophils Relative: 1
Eosinophils Absolute: 0 — ABNORMAL LOW
Eosinophils Relative: 1
Lymphocytes Relative: 28
Lymphs Abs: 2.3
Monocytes Absolute: 0.6
Monocytes Relative: 8
Neutro Abs: 5.2
Neutrophils Relative %: 63

## 2011-06-23 LAB — CLOSTRIDIUM DIFFICILE EIA

## 2011-06-23 LAB — BASIC METABOLIC PANEL
CO2: 27
Calcium: 8.7
Chloride: 104
Chloride: 105
Creatinine, Ser: 0.66
GFR calc Af Amer: >60
GFR calc Af Amer: >60
Potassium: 3.5
Sodium: 138

## 2011-06-23 LAB — OVA AND PARASITE EXAMINATION

## 2011-06-23 LAB — APTT: aPTT: 27

## 2011-06-23 LAB — URINE CULTURE

## 2011-06-23 LAB — CARDIAC PANEL(CRET KIN+CKTOT+MB+TROPI)
Total CK: 30
Total CK: 40

## 2011-06-23 LAB — URINE MICROSCOPIC-ADD ON

## 2011-06-23 LAB — MAGNESIUM: Magnesium: 2.3

## 2011-06-23 LAB — TISSUE TRANSGLUTAMINASE, IGG: Tissue Transglut Ab: 0.8 U/mL (ref <7)

## 2011-06-23 LAB — TSH: TSH: 4.338

## 2011-06-26 LAB — URINE MICROSCOPIC-ADD ON

## 2011-06-26 LAB — BASIC METABOLIC PANEL
BUN: 3 — ABNORMAL LOW
Calcium: 9.1
Creatinine, Ser: 0.58
GFR calc Af Amer: >60
GFR calc non Af Amer: >60

## 2011-06-26 LAB — URINALYSIS, ROUTINE W REFLEX MICROSCOPIC
Ketones, ur: NEGATIVE
Protein, ur: NEGATIVE
Urobilinogen, UA: 0.2

## 2011-06-27 LAB — COMPREHENSIVE METABOLIC PANEL
ALT: 29
Albumin: 3.6
Alkaline Phosphatase: 130 — ABNORMAL HIGH
BUN: 3 — ABNORMAL LOW
CO2: 26
Calcium: 9
Chloride: 106
Creatinine, Ser: 0.61
Creatinine, Ser: 0.64
GFR calc Af Amer: >60
GFR calc non Af Amer: >60
Glucose, Bld: 110 — ABNORMAL HIGH
Glucose, Bld: 91
Sodium: 140
Total Bilirubin: 0.6
Total Protein: 7
Total Protein: 7.7

## 2011-06-27 LAB — URINALYSIS, ROUTINE W REFLEX MICROSCOPIC
Bilirubin Urine: NEGATIVE
Bilirubin Urine: NEGATIVE
Glucose, UA: NEGATIVE
Hgb urine dipstick: NEGATIVE
Ketones, ur: NEGATIVE
Nitrite: NEGATIVE
Protein, ur: NEGATIVE
Specific Gravity, Urine: 1.003 — ABNORMAL LOW
Urobilinogen, UA: 0.2
pH: 6.5
pH: 7

## 2011-06-27 LAB — DIFFERENTIAL
Basophils Absolute: 0
Basophils Relative: 0
Eosinophils Absolute: 0 — ABNORMAL LOW
Eosinophils Relative: 1
Lymphocytes Relative: 31
Lymphs Abs: 2.2
Monocytes Absolute: 0.5
Monocytes Absolute: 0.6
Monocytes Relative: 7
Neutro Abs: 5.6
Neutrophils Relative %: 61

## 2011-06-27 LAB — CBC
HCT: 37
Hemoglobin: 12.7
Hemoglobin: 13.7
MCHC: 34.8
MCV: 86.4
MCV: 86.5
Platelets: 309
RBC: 4.54
RDW: 13.2
RDW: 13.4

## 2011-07-23 ENCOUNTER — Inpatient Hospital Stay (HOSPITAL_COMMUNITY)
Admission: EM | Admit: 2011-07-23 | Discharge: 2011-07-26 | DRG: 392 | Disposition: A | Discharge status: Home / Self Care | Payer: Medicare Other | Attending: Internal Medicine | Admitting: Internal Medicine

## 2011-07-23 ENCOUNTER — Other Ambulatory Visit: Payer: Self-pay

## 2011-07-23 ENCOUNTER — Encounter: Payer: Self-pay | Admitting: *Deleted

## 2011-07-23 ENCOUNTER — Emergency Department (HOSPITAL_COMMUNITY): Payer: Medicare Other

## 2011-07-23 DIAGNOSIS — R109 Unspecified abdominal pain: Secondary | ICD-10-CM | POA: Diagnosis present

## 2011-07-23 DIAGNOSIS — E119 Type 2 diabetes mellitus without complications: Secondary | ICD-10-CM | POA: Diagnosis present

## 2011-07-23 DIAGNOSIS — E785 Hyperlipidemia, unspecified: Secondary | ICD-10-CM | POA: Diagnosis present

## 2011-07-23 DIAGNOSIS — R079 Chest pain, unspecified: Secondary | ICD-10-CM

## 2011-07-23 DIAGNOSIS — R0789 Other chest pain: Secondary | ICD-10-CM | POA: Diagnosis present

## 2011-07-23 DIAGNOSIS — Z950 Presence of cardiac pacemaker: Secondary | ICD-10-CM

## 2011-07-23 DIAGNOSIS — E669 Obesity, unspecified: Secondary | ICD-10-CM | POA: Diagnosis present

## 2011-07-23 DIAGNOSIS — M129 Arthropathy, unspecified: Secondary | ICD-10-CM | POA: Diagnosis present

## 2011-07-23 DIAGNOSIS — I10 Essential (primary) hypertension: Secondary | ICD-10-CM | POA: Diagnosis present

## 2011-07-23 DIAGNOSIS — R51 Headache: Secondary | ICD-10-CM | POA: Diagnosis present

## 2011-07-23 DIAGNOSIS — R112 Nausea with vomiting, unspecified: Principal | ICD-10-CM | POA: Diagnosis present

## 2011-07-23 DIAGNOSIS — I495 Sick sinus syndrome: Secondary | ICD-10-CM | POA: Insufficient documentation

## 2011-07-23 DIAGNOSIS — E782 Mixed hyperlipidemia: Secondary | ICD-10-CM | POA: Insufficient documentation

## 2011-07-23 HISTORY — DX: Atherosclerotic heart disease of native coronary artery without angina pectoris: I25.10

## 2011-07-23 HISTORY — DX: Unspecified osteoarthritis, unspecified site: M19.90

## 2011-07-23 HISTORY — DX: Headache: R51

## 2011-07-23 HISTORY — DX: Malignant (primary) neoplasm, unspecified: C80.1

## 2011-07-23 HISTORY — DX: Other specified postprocedural states: Z98.890

## 2011-07-23 HISTORY — DX: Presence of cardiac pacemaker: Z95.0

## 2011-07-23 HISTORY — DX: Essential (primary) hypertension: I10

## 2011-07-23 HISTORY — DX: Fibromyalgia: M79.7

## 2011-07-23 HISTORY — DX: Pure hypercholesterolemia, unspecified: E78.00

## 2011-07-23 HISTORY — DX: Anxiety disorder, unspecified: F41.9

## 2011-07-23 HISTORY — DX: Nausea with vomiting, unspecified: R11.2

## 2011-07-23 LAB — DIFFERENTIAL
Eosinophils Absolute: 0.1 10*3/uL (ref 0.0–0.7)
Lymphocytes Relative: 27 % (ref 12–46)
Lymphs Abs: 3.1 10*3/uL (ref 0.7–4.0)
Monocytes Relative: 6 % (ref 3–12)
Neutro Abs: 7.4 10*3/uL (ref 1.7–7.7)
Neutrophils Relative %: 65 % (ref 43–77)

## 2011-07-23 LAB — CBC
Hemoglobin: 13 g/dL (ref 12.0–15.0)
Platelets: 257 10*3/uL (ref 150–400)
RBC: 4.52 MIL/uL (ref 3.87–5.11)
WBC: 11.4 10*3/uL — ABNORMAL HIGH (ref 4.0–10.5)

## 2011-07-23 LAB — BASIC METABOLIC PANEL
BUN: 14 mg/dL (ref 6–23)
CO2: 26 mEq/L (ref 19–32)
Chloride: 97 mEq/L (ref 96–112)
GFR calc non Af Amer: 75 mL/min — ABNORMAL LOW (ref >90)
Glucose, Bld: 199 mg/dL — ABNORMAL HIGH (ref 70–99)
Potassium: 3.5 mEq/L (ref 3.5–5.1)
Sodium: 136 mEq/L (ref 135–145)

## 2011-07-23 LAB — POCT I-STAT TROPONIN I: Troponin i, poc: 0 ng/mL (ref 0.00–0.08)

## 2011-07-23 LAB — PROTIME-INR: INR: 0.91 (ref 0.00–1.49)

## 2011-07-23 MED ORDER — ONDANSETRON HCL 4 MG/2ML IJ SOLN
4.0000 mg | Freq: Once | INTRAMUSCULAR | Status: AC
  Administered 2011-07-23: 4 mg via INTRAVENOUS
  Filled 2011-07-23: qty 2

## 2011-07-23 MED ORDER — MORPHINE SULFATE 4 MG/ML IJ SOLN
4.0000 mg | Freq: Once | INTRAMUSCULAR | Status: AC
  Administered 2011-07-23: 4 mg via INTRAVENOUS
  Filled 2011-07-23: qty 1

## 2011-07-23 MED ORDER — ASPIRIN 81 MG PO CHEW
324.0000 mg | CHEWABLE_TABLET | Freq: Once | ORAL | Status: AC
  Administered 2011-07-23: 324 mg via ORAL
  Filled 2011-07-23: qty 4

## 2011-07-23 MED ORDER — NITROGLYCERIN 0.4 MG SL SUBL
0.4000 mg | SUBLINGUAL_TABLET | SUBLINGUAL | Status: DC | PRN
  Administered 2011-07-23 (×3): 0.4 mg via SUBLINGUAL
  Filled 2011-07-23: qty 25

## 2011-07-23 NOTE — ED Notes (Signed)
Patient complaining of nausea, Dr Shawnie Dapper notified.  New orders per Dr Shawnie Dapper.

## 2011-07-23 NOTE — ED Notes (Signed)
IV team called for iv placement

## 2011-07-23 NOTE — H&P (Signed)
[name] is an 60 y.o. female.   Chief Complaint: Chest pain  HPI: 60 y/o [place] female who woke at 3am  with mild substernal chest pain, that radiated to the neck,and left arm,and was associated with Nausea, diaphoresis, SOB,and palpitation. The pain wax and wayne but kept  getting progressively worse through out the day until she came to ER at about 4 pm. She denied any emesis.  In the ER she was given 325 mg of aspirin, 2 nitro with out relief, then 4 mg of morphine IV some mild relief. The chest has return and a second dose of morphine has not help.  Past Medical History  Diagnosis Date  . Hypertension   . High cholesterol   . Pacemaker   . Diabetes mellitus   . Arthritis   . Headache     Past Surgical History  Procedure Date  . Cholecystectomy   . Joint replacement   . Insert / replace / remove pacemaker   . Abdominal hysterectomy   . Fracture surgery     Family History  Problem Relation Age of Onset  . Coronary artery disease Mother   . Cancer Father   . Brain cancer Brother    Social History:  reports that she has never smoked. She does not have any smokeless tobacco history on file. She reports that she does not drink alcohol or use illicit drugs.  Allergies:  Allergies  Allergen Reactions  . Shellfish Allergy Anaphylaxis  . Decadron (Dexamethasone) Nausea And Vomiting  . Erythromycin Itching  . Iohexol      Desc: pt states reaction is anaphalactic shock from IV dye and any kind of shellfish. pt states she almost died the last time she had a reaction.   Marland Kitchen Keflex Nausea And Vomiting  . Penicillins Itching  . Prednisone Nausea And Vomiting  . Questran Nausea And Vomiting  . Robaxin Itching and Nausea And Vomiting  . Toradol Other (See Comments)    unknown    Medications Prior to Admission  Medication Dose Route Frequency Provider Last Rate Last Dose  . aspirin chewable tablet 324 mg  324 mg Oral Once Dayton Bailiff, MD   324 mg at 07/23/11 1810  . morphine  4 MG/ML injection 4 mg  4 mg Intravenous Once Dayton Bailiff, MD   4 mg at 07/23/11 2004  . morphine 4 MG/ML injection 4 mg  4 mg Intravenous Once Dayton Bailiff, MD   4 mg at 07/23/11 2119  . nitroGLYCERIN (NITROSTAT) SL tablet 0.4 mg  0.4 mg Sublingual Q5 min PRN Dayton Bailiff, MD   0.4 mg at 07/23/11 1824  . ondansetron (ZOFRAN) injection 4 mg  4 mg Intravenous Once Dayton Bailiff, MD   4 mg at 07/23/11 2001   No current outpatient prescriptions on file as of 07/23/2011.    Results for orders placed during the hospital encounter of 07/23/11 (from the past 48 hour(s))  CBC     Status: Abnormal   Collection Time   07/23/11  5:35 PM      Component Value Range Comment   WBC 11.4 (*) 4.0 - 10.5 (K/uL)    RBC 4.52  3.87 - 5.11 (MIL/uL)    Hemoglobin 13.0  12.0 - 15.0 (g/dL)    HCT 16.1  09.6 - 04.5 (%)    MCV 85.6  78.0 - 100.0 (fL)    MCH 28.8  26.0 - 34.0 (pg)    MCHC 33.6  30.0 - 36.0 (g/dL)  RDW 13.8  11.5 - 15.5 (%)    Platelets 257  150 - 400 (K/uL)   DIFFERENTIAL     Status: Normal   Collection Time   07/23/11  5:35 PM      Component Value Range Comment   Neutrophils Relative 65  43 - 77 (%)    Neutro Abs 7.4  1.7 - 7.7 (K/uL)    Lymphocytes Relative 27  12 - 46 (%)    Lymphs Abs 3.1  0.7 - 4.0 (K/uL)    Monocytes Relative 6  3 - 12 (%)    Monocytes Absolute 0.7  0.1 - 1.0 (K/uL)    Eosinophils Relative 1  0 - 5 (%)    Eosinophils Absolute 0.1  0.0 - 0.7 (K/uL)    Basophils Relative 0  0 - 1 (%)    Basophils Absolute 0.0  0.0 - 0.1 (K/uL)   BASIC METABOLIC PANEL     Status: Abnormal   Collection Time   07/23/11  5:35 PM      Component Value Range Comment   Sodium 136  135 - 145 (mEq/L)    Potassium 3.5  3.5 - 5.1 (mEq/L)    Chloride 97  96 - 112 (mEq/L)    CO2 26  19 - 32 (mEq/L)    Glucose, Bld 199 (*) 70 - 99 (mg/dL)    BUN 14  6 - 23 (mg/dL)    Creatinine, Ser 8.29  0.50 - 1.10 (mg/dL)    Calcium 9.7  8.4 - 10.5 (mg/dL)    GFR calc non Af Amer 75 (*) >90 (mL/min)    GFR  calc Af Amer 87 (*) >90 (mL/min)   PROTIME-INR     Status: Normal   Collection Time   07/23/11  5:35 PM      Component Value Range Comment   Prothrombin Time 12.5  11.6 - 15.2 (seconds)    INR 0.91  0.00 - 1.49    POCT I-STAT TROPONIN I     Status: Normal   Collection Time   07/23/11  6:27 PM      Component Value Range Comment   Troponin i, poc 0.00  0.00 - 0.08 (ng/mL)    Comment 3             Dg Chest 2 View  07/23/2011  *RADIOLOGY REPORT*  Clinical Data: Chest pain  CHEST - 2 VIEW  Comparison: 12/25/2010  Findings: Stable left subclavian pacemaker and leads.  Heart is moderately enlarged.  Pulmonary vascularity is within normal limits.  Low lung volumes and bibasilar atelectasis.  No sign of interstitial edema.  IMPRESSION: Cardiomegaly without edema.  Bibasilar atelectasis.  Original Report Authenticated By: Donavan Burnet, M.D.    Review of Systems  Constitutional: Positive for diaphoresis.  HENT: Negative.   Eyes: Negative.   Respiratory: Positive for shortness of breath. Negative for cough, hemoptysis, sputum production and wheezing.   Cardiovascular: Positive for chest pain and palpitations. Negative for orthopnea, claudication, leg swelling and PND.  Gastrointestinal: Positive for nausea and diarrhea. Negative for heartburn, vomiting, constipation, blood in stool and melena.  Genitourinary: Negative.   Musculoskeletal: Negative.   Skin: Negative.   Neurological: Negative.   Psychiatric/Behavioral: Negative for depression, suicidal ideas, hallucinations, memory loss and substance abuse. The patient is nervous/anxious. The patient does not have insomnia.     Blood pressure 103/59, pulse 71, temperature 98.6 F (37 C), temperature source Oral, resp. rate 18, SpO2 91.00%. Physical Exam  Constitutional: She is oriented to person, place, and time. She appears well-developed and well-nourished. She appears distressed.  HENT:  Head: Normocephalic and atraumatic.  Right Ear:  External ear normal.  Left Ear: External ear normal.  Nose: Nose normal.  Mouth/Throat: Oropharynx is clear and moist.  Eyes: Conjunctivae and EOM are normal. Pupils are equal, round, and reactive to light.  Neck: Normal range of motion. Neck supple. No JVD present. No tracheal deviation present. No thyromegaly present.  Cardiovascular: Normal rate, regular rhythm and normal heart sounds.   No murmur heard. Respiratory: No respiratory distress. She has no wheezes. She has no rales. She exhibits no tenderness.  GI: Soft. Bowel sounds are normal. She exhibits no distension and no mass. There is no tenderness. There is no rebound and no guarding.  Musculoskeletal: Normal range of motion. She exhibits no edema.  Lymphadenopathy:    She has no cervical adenopathy.  Neurological: She is alert and oriented to person, place, and time. She has normal reflexes. No cranial nerve deficit. Coordination normal.  Skin: Skin is warm. No rash noted. She is diaphoretic. No erythema. No pallor.  Psychiatric: She has a normal mood and affect. Her behavior is normal. Judgment and thought content normal.     Assessment/Plan Chest pain with inverted t wave in inferior leads Diabetes type 2 poorly control Hypertension  Hyperlipidemia Obesity Migraine  Headaches   Plan  Admit to step down          [facility] team 10          Cardiology Consults          Serial cardiac enzymes          Serial EKG          Diabetic sliding scale           Med reconciliation          Admit order set    [facility] 07/23/2011, 9:37 PM

## 2011-07-23 NOTE — ED Provider Notes (Signed)
History     CSN: [id number] Arrival date & time: 07/23/2011  4:37 PM   First MD Initiated Contact with Patient 07/23/11 1727      Chief Complaint  Patient presents with  . Chest Pain    (Consider location/radiation/quality/duration/timing/severity/associated sxs/prior treatment) HPI Comments: Diaphoresis, shortness of breath, nausea worse with exertion. Began this morning upon awakening  Patient is a 60 y.o. female presenting with chest pain. The history is provided by the patient. No language interpreter was used.  Chest Pain The chest pain began 6 - 12 hours ago. Chest pain occurs constantly. The chest pain is worsening. The pain is associated with exertion. The severity of the pain is moderate. The quality of the pain is described as dull and heavy. The pain radiates to the left shoulder and left neck. Chest pain is worsened by exertion. Primary symptoms include shortness of breath and nausea. Pertinent negatives for primary symptoms include no fever, no syncope, no cough, no palpitations, no abdominal pain, no vomiting and no dizziness.  Nausea began today. Associated with: cp. The nausea is exacerbated by activity.   Associated symptoms include diaphoresis and near-syncope.  Pertinent negatives for associated symptoms include no claudication, no orthopnea, no paroxysmal nocturnal dyspnea and no weakness. She tried aspirin for the symptoms. Risk factors include obesity.  Her past medical history is significant for arrhythmia, diabetes, hyperlipidemia, hypertension and pacemaker.     Past Medical History  Diagnosis Date  . Hypertension   . High cholesterol   . Pacemaker   . Diabetes mellitus   . Arthritis   . Headache     Past Surgical History  Procedure Date  . Cholecystectomy   . Joint replacement   . Insert / replace / remove pacemaker   . Abdominal hysterectomy   . Fracture surgery     Family History  Problem Relation Age of Onset  . Coronary artery disease  Mother   . Cancer Father   . Brain cancer Brother     History  Substance Use Topics  . Smoking status: Never Smoker   . Smokeless tobacco: Not on file  . Alcohol Use: No    OB History    Grav Para Term Preterm Abortions TAB SAB Ect Mult Living                  Review of Systems  Constitutional: Positive for diaphoresis. Negative for fever, activity change and appetite change.  HENT: Negative for congestion, sore throat, rhinorrhea, neck pain and neck stiffness.   Respiratory: Positive for shortness of breath. Negative for cough and chest tightness.   Cardiovascular: Positive for chest pain and near-syncope. Negative for palpitations, orthopnea, claudication and syncope.  Gastrointestinal: Positive for nausea. Negative for vomiting and abdominal pain.  Genitourinary: Negative for dysuria, urgency, frequency and flank pain.  Neurological: Positive for light-headedness. Negative for dizziness, syncope, weakness and headaches.  All other systems reviewed and are negative.    Allergies  Shellfish allergy; Decadron; Erythromycin; Iohexol; Keflex; Penicillins; Prednisone; Questran; Robaxin; and Toradol  Home Medications   Current Outpatient Rx  Name Route Sig Dispense Refill  . ASPIRIN 81 MG PO TABS Oral Take 81 mg by mouth daily.      . ATENOLOL 25 MG PO TABS Oral Take 25 mg by mouth daily.      Marland Kitchen DIAZEPAM 10 MG PO TABS Oral Take 10 mg by mouth 2 (two) times daily as needed. For anxiety    . FAMOTIDINE 20 MG PO  TABS Oral Take 20 mg by mouth daily.     Marland Kitchen LISINOPRIL-HYDROCHLOROTHIAZIDE 10-12.5 MG PO TABS Oral Take 1 tablet by mouth daily.      Marland Kitchen METFORMIN HCL 850 MG PO TABS Oral Take 850 mg by mouth daily with breakfast.     . PANTOPRAZOLE SODIUM 40 MG PO TBEC Oral Take 40 mg by mouth daily.      Marland Kitchen SIMVASTATIN 40 MG PO TABS Oral Take 40 mg by mouth at bedtime.        BP 111/54  Pulse 64  Temp(Src) 97.7 F (36.5 C) (Oral)  Resp 18  SpO2 95%  Physical Exam  Nursing note  and vitals reviewed. Constitutional: She appears well-developed and well-nourished. She appears distressed (uncomfortable).  HENT:  Head: Normocephalic and atraumatic.  Mouth/Throat: Oropharynx is clear and moist. No oropharyngeal exudate.  Eyes: Conjunctivae and EOM are normal. Pupils are equal, round, and reactive to light.  Neck: Normal range of motion. Neck supple.  Cardiovascular: Normal rate, regular rhythm, normal heart sounds and intact distal pulses.  Exam reveals no gallop and no friction rub.   No murmur heard. Pulmonary/Chest: Effort normal and breath sounds normal. No respiratory distress. She exhibits no tenderness.  Abdominal: Soft. Bowel sounds are normal. There is no tenderness. There is no rebound and no guarding.  Musculoskeletal: Normal range of motion. She exhibits no tenderness.  Skin: Skin is warm and dry. No rash noted.    ED Course  Procedures (including critical care time)   Date: 07/23/2011  Rate: 71  Rhythm: normal sinus rhythm  QRS Axis: normal  Intervals: normal  ST/T Wave abnormalities: ST depressions laterally and t wave inversions new inferolaterally  Conduction Disutrbances:none  Narrative Interpretation:   Old EKG Reviewed: changes noted  Labs Reviewed  CBC - Abnormal; Notable for the following:    WBC 11.4 (*)    All other components within normal limits  BASIC METABOLIC PANEL - Abnormal; Notable for the following:    Glucose, Bld 199 (*)    GFR calc non Af Amer 75 (*)    GFR calc Af Amer 87 (*)    All other components within normal limits  DIFFERENTIAL  PROTIME-INR  POCT I-STAT TROPONIN I  POCT I-STAT TROPONIN I  I-STAT TROPONIN I  I-STAT TROPONIN I   Dg Chest 2 View  07/23/2011  *RADIOLOGY REPORT*  Clinical Data: Chest pain  CHEST - 2 VIEW  Comparison: 12/25/2010  Findings: Stable left subclavian pacemaker and leads.  Heart is moderately enlarged.  Pulmonary vascularity is within normal limits.  Low lung volumes and bibasilar  atelectasis.  No sign of interstitial edema.  IMPRESSION: Cardiomegaly without edema.  Bibasilar atelectasis.  Original Report Authenticated By: Donavan Burnet, M.D.     1. Chest pain       MDM  Patient with chest pain with multiple risk factors. She has new T wave inversions to suggest possible ischemic etiology. Laboratory studies were obtained. Initial troponin is negative. The chest pain has been present for over 6 hours. Nitroglycerin did not relief her pain. I administered morphine with improvement of her symptoms. She received aspirin. Given the new ischemic EKG changes I am concerned warrants admission. Discussed the case the triad hospitalist who accepted the patient for admission. She remained stable numerous department. I'm not concerned about other etiologies such as aortic dissection or pulmonary embolus         Dayton Bailiff, MD 07/24/11 [phone]

## 2011-07-23 NOTE — ED Notes (Signed)
IV attempt two times unsuccessful.

## 2011-07-23 NOTE — ED Notes (Signed)
Patient continuing with 7/10 chest pain.  Admitting MD notified of pain.  MD to call cardiologist.

## 2011-07-23 NOTE — ED Notes (Signed)
Pt reports chest pain that started this am, mid chest radiates into jaw, neck and left arm. Mild nausea and sob. ekg being done at triage.

## 2011-07-24 ENCOUNTER — Encounter (HOSPITAL_COMMUNITY): Payer: Self-pay | Admitting: General Practice

## 2011-07-24 ENCOUNTER — Other Ambulatory Visit: Payer: Self-pay

## 2011-07-24 DIAGNOSIS — R079 Chest pain, unspecified: Secondary | ICD-10-CM

## 2011-07-24 LAB — HEPATIC FUNCTION PANEL
AST: 33 U/L (ref 0–37)
Albumin: 3.5 g/dL (ref 3.5–5.2)
Alkaline Phosphatase: 242 U/L — ABNORMAL HIGH (ref 39–117)
Total Bilirubin: 0.4 mg/dL (ref 0.3–1.2)
Total Protein: 7.7 g/dL (ref 6.0–8.3)

## 2011-07-24 LAB — CARDIAC PANEL(CRET KIN+CKTOT+MB+TROPI)
CK, MB: 2.5 ng/mL (ref 0.3–4.0)
Relative Index: 1.1 (ref 0.0–2.5)
Relative Index: 1.1 (ref 0.0–2.5)
Total CK: 210 U/L — ABNORMAL HIGH (ref 7–177)
Total CK: 221 U/L — ABNORMAL HIGH (ref 7–177)
Troponin I: <0.3 ng/mL (ref <0.30)

## 2011-07-24 LAB — HEMOGLOBIN A1C: Hgb A1c MFr Bld: 10.1 % — ABNORMAL HIGH (ref <5.7)

## 2011-07-24 LAB — BASIC METABOLIC PANEL
BUN: 13 mg/dL (ref 6–23)
CO2: 23 mEq/L (ref 19–32)
Calcium: 9 mg/dL (ref 8.4–10.5)
Chloride: 103 mEq/L (ref 96–112)
Creatinine, Ser: 0.92 mg/dL (ref 0.50–1.10)
Glucose, Bld: 152 mg/dL — ABNORMAL HIGH (ref 70–99)

## 2011-07-24 LAB — GLUCOSE, CAPILLARY: Glucose-Capillary: 165 mg/dL — ABNORMAL HIGH (ref 70–99)

## 2011-07-24 LAB — HEPARIN LEVEL (UNFRACTIONATED): Heparin Unfractionated: 0.18 IU/mL — ABNORMAL LOW (ref 0.30–0.70)

## 2011-07-24 MED ORDER — FAMOTIDINE 20 MG PO TABS
20.0000 mg | ORAL_TABLET | Freq: Every day | ORAL | Status: DC
  Administered 2011-07-24 – 2011-07-26 (×3): 20 mg via ORAL
  Filled 2011-07-24 (×3): qty 1

## 2011-07-24 MED ORDER — LISINOPRIL 10 MG PO TABS
10.0000 mg | ORAL_TABLET | Freq: Every day | ORAL | Status: DC
  Administered 2011-07-24 – 2011-07-26 (×3): 10 mg via ORAL
  Filled 2011-07-24 (×3): qty 1

## 2011-07-24 MED ORDER — HYDROMORPHONE HCL PF 1 MG/ML IJ SOLN
2.0000 mg | INTRAMUSCULAR | Status: DC | PRN
  Administered 2011-07-24: 1 mg via INTRAVENOUS
  Administered 2011-07-24 – 2011-07-26 (×10): 2 mg via INTRAVENOUS
  Filled 2011-07-24 (×3): qty 2
  Filled 2011-07-24: qty 1
  Filled 2011-07-24 (×2): qty 2
  Filled 2011-07-24: qty 1
  Filled 2011-07-24: qty 2
  Filled 2011-07-24 (×2): qty 1
  Filled 2011-07-24 (×3): qty 2

## 2011-07-24 MED ORDER — HYDROCHLOROTHIAZIDE 12.5 MG PO CAPS
12.5000 mg | ORAL_CAPSULE | Freq: Every day | ORAL | Status: DC
  Administered 2011-07-24 – 2011-07-26 (×3): 12.5 mg via ORAL
  Filled 2011-07-24 (×3): qty 1

## 2011-07-24 MED ORDER — NITROGLYCERIN IN D5W 200-5 MCG/ML-% IV SOLN
10.0000 ug/min | INTRAVENOUS | Status: DC
  Administered 2011-07-24 (×2): 10 ug/min via INTRAVENOUS
  Filled 2011-07-24: qty 250

## 2011-07-24 MED ORDER — ATENOLOL 25 MG PO TABS
25.0000 mg | ORAL_TABLET | Freq: Every day | ORAL | Status: DC
  Administered 2011-07-24 – 2011-07-26 (×3): 25 mg via ORAL
  Filled 2011-07-24 (×3): qty 1

## 2011-07-24 MED ORDER — INSULIN ASPART 100 UNIT/ML ~~LOC~~ SOLN
0.0000 [IU] | Freq: Three times a day (TID) | SUBCUTANEOUS | Status: DC
  Administered 2011-07-24 – 2011-07-25 (×4): 2 [IU] via SUBCUTANEOUS
  Administered 2011-07-25: 1 [IU] via SUBCUTANEOUS
  Administered 2011-07-25: 2 [IU] via SUBCUTANEOUS
  Filled 2011-07-24: qty 3

## 2011-07-24 MED ORDER — ACETAMINOPHEN 650 MG RE SUPP: 650.0000 mg | Freq: Four times a day (QID) | RECTAL | Status: DC | PRN

## 2011-07-24 MED ORDER — ASPIRIN EC 325 MG PO TBEC
325.0000 mg | DELAYED_RELEASE_TABLET | Freq: Every day | ORAL | Status: DC
Start: 2011-07-24 — End: 2011-07-26
  Administered 2011-07-24 – 2011-07-26 (×3): 325 mg via ORAL
  Filled 2011-07-24 (×3): qty 1

## 2011-07-24 MED ORDER — HEPARIN (PORCINE) IN NACL 100-0.45 UNIT/ML-% IJ SOLN
1300.0000 [IU]/h | INTRAMUSCULAR | Status: DC
  Administered 2011-07-24: 1050 [IU]/h via INTRAVENOUS
  Administered 2011-07-24: 1300 [IU]/h via INTRAVENOUS
  Filled 2011-07-24 (×4): qty 250

## 2011-07-24 MED ORDER — INSULIN ASPART 100 UNIT/ML ~~LOC~~ SOLN: 0.0000 [IU] | Freq: Every day | SUBCUTANEOUS | Status: DC

## 2011-07-24 MED ORDER — ZOLPIDEM TARTRATE 5 MG PO TABS: 5.0000 mg | ORAL_TABLET | Freq: Every evening | ORAL | Status: DC | PRN

## 2011-07-24 MED ORDER — LISINOPRIL-HYDROCHLOROTHIAZIDE 10-12.5 MG PO TABS: 1.0000 | ORAL_TABLET | Freq: Every day | ORAL | Status: DC

## 2011-07-24 MED ORDER — OXYCODONE HCL 5 MG PO TABS
5.0000 mg | ORAL_TABLET | ORAL | Status: DC | PRN
  Administered 2011-07-25: 5 mg via ORAL
  Filled 2011-07-24: qty 1

## 2011-07-24 MED ORDER — ONDANSETRON HCL 4 MG/2ML IJ SOLN
4.0000 mg | INTRAMUSCULAR | Status: DC | PRN
  Administered 2011-07-24 – 2011-07-26 (×7): 4 mg via INTRAVENOUS
  Filled 2011-07-24 (×10): qty 2

## 2011-07-24 MED ORDER — METFORMIN HCL 850 MG PO TABS
850.0000 mg | ORAL_TABLET | Freq: Every day | ORAL | Status: DC
  Administered 2011-07-24 – 2011-07-26 (×3): 850 mg via ORAL
  Filled 2011-07-24 (×4): qty 1

## 2011-07-24 MED ORDER — ENOXAPARIN SODIUM 40 MG/0.4ML ~~LOC~~ SOLN
40.0000 mg | SUBCUTANEOUS | Status: DC
  Filled 2011-07-24: qty 0.4

## 2011-07-24 MED ORDER — SODIUM CHLORIDE 0.9 % IV SOLN: 250.0000 mL | INTRAVENOUS | Status: DC

## 2011-07-24 MED ORDER — HEPARIN BOLUS VIA INFUSION
4000.0000 [IU] | Freq: Once | INTRAVENOUS | Status: AC
  Administered 2011-07-24: 4000 [IU] via INTRAVENOUS
  Filled 2011-07-24: qty 4000

## 2011-07-24 MED ORDER — ONDANSETRON HCL 4 MG PO TABS: 4.0000 mg | ORAL_TABLET | Freq: Four times a day (QID) | ORAL | Status: DC | PRN

## 2011-07-24 MED ORDER — SIMVASTATIN 40 MG PO TABS
40.0000 mg | ORAL_TABLET | Freq: Every day | ORAL | Status: DC
Start: 2011-07-24 — End: 2011-07-26
  Administered 2011-07-24 – 2011-07-25 (×2): 40 mg via ORAL
  Filled 2011-07-24 (×3): qty 1

## 2011-07-24 MED ORDER — SODIUM CHLORIDE 0.9 % IJ SOLN
3.0000 mL | Freq: Two times a day (BID) | INTRAMUSCULAR | Status: DC
  Administered 2011-07-24 – 2011-07-25 (×4): 3 mL via INTRAVENOUS

## 2011-07-24 MED ORDER — PANTOPRAZOLE SODIUM 40 MG PO TBEC
40.0000 mg | DELAYED_RELEASE_TABLET | Freq: Every day | ORAL | Status: DC
  Administered 2011-07-24 – 2011-07-26 (×3): 40 mg via ORAL
  Filled 2011-07-24 (×3): qty 1

## 2011-07-24 MED ORDER — ACETAMINOPHEN 325 MG PO TABS
650.0000 mg | ORAL_TABLET | Freq: Four times a day (QID) | ORAL | Status: DC | PRN
Start: 2011-07-24 — End: 2011-07-26

## 2011-07-24 MED ORDER — ONDANSETRON HCL 4 MG/2ML IJ SOLN
4.0000 mg | Freq: Four times a day (QID) | INTRAMUSCULAR | Status: DC | PRN
  Administered 2011-07-24 (×2): 4 mg via INTRAVENOUS
  Filled 2011-07-24 (×2): qty 2

## 2011-07-24 MED ORDER — SODIUM CHLORIDE 0.9 % IJ SOLN: 3.0000 mL | INTRAMUSCULAR | Status: DC | PRN

## 2011-07-24 MED ORDER — PROMETHAZINE HCL 25 MG/ML IJ SOLN
25.0000 mg | Freq: Four times a day (QID) | INTRAMUSCULAR | Status: DC | PRN
  Administered 2011-07-24: 25 mg via INTRAVENOUS
  Filled 2011-07-24 (×3): qty 1

## 2011-07-24 MED ORDER — DIAZEPAM 5 MG PO TABS
10.0000 mg | ORAL_TABLET | Freq: Two times a day (BID) | ORAL | Status: DC | PRN
Start: 2011-07-24 — End: 2011-07-26

## 2011-07-24 NOTE — Consult Note (Signed)
Referring Physician: Florence Canner Editor, commissioning Service) Primary Cardiologist: Lolita Lenz - [place] Reason for Consultation: Chest pain  HPI: 38 YOWF with PMH of DM, HTN, HLD, SSS s/p pacer who presented to the ED for evaluation of chest pain.  She reports waking up at 3am (~24 hrs ago) with nausea and diaphoresis.  She had multiple episodes of emesis (~10 times) and diarrhea (6 times) over the past 24 hours.  She also notes intermittent left sided chest pressure that radiates to the left jaw, left arm, and left face.  The pain waxed and waned today and was slightly worse with exertion.  She came to the ED and was given SL NTG, 4mg  morphine, 4mg  zofran and continues to have chest pain.  Of note, troponins are negative x 2.  Due to persistent CP, cardiology was consulted.    ROS: All other systems normal except as mentioned in HPI, past medical history and problem list.    Past Medical History  Diagnosis Date  . Hypertension   . High cholesterol   . Pacemaker   . Diabetes mellitus   . Arthritis   . Headache   - Notable for cath in the past which she reports was normal - PPM for SSS / symptomatic bradycardia     Current Facility-Administered Medications  Medication Dose Route Frequency Provider Last Rate Last Dose  . aspirin chewable tablet 324 mg  324 mg Oral Once Dayton Bailiff, MD   324 mg at 07/23/11 1810  . morphine 4 MG/ML injection 4 mg  4 mg Intravenous Once Dayton Bailiff, MD   4 mg at 07/23/11 2004  . morphine 4 MG/ML injection 4 mg  4 mg Intravenous Once Dayton Bailiff, MD   4 mg at 07/23/11 2119  . nitroGLYCERIN (NITROSTAT) SL tablet 0.4 mg  0.4 mg Sublingual Q5 min PRN Dayton Bailiff, MD   0.4 mg at 07/23/11 1824  . ondansetron (ZOFRAN) injection 4 mg  4 mg Intravenous Once Dayton Bailiff, MD   4 mg at 07/23/11 2001  . ondansetron (ZOFRAN) injection 4 mg  4 mg Intravenous Once Arne Cleveland, MD   4 mg at 07/23/11 2242   Current Outpatient Prescriptions  Medication Sig Dispense Refill  . aspirin  81 MG tablet Take 81 mg by mouth daily.        Marland Kitchen atenolol (TENORMIN) 25 MG tablet Take 25 mg by mouth daily.        . diazepam (VALIUM) 10 MG tablet Take 10 mg by mouth 2 (two) times daily as needed. For anxiety      . famotidine (PEPCID) 20 MG tablet Take 20 mg by mouth daily.       Marland Kitchen lisinopril-hydrochlorothiazide (PRINZIDE,ZESTORETIC) 10-12.5 MG per tablet Take 1 tablet by mouth daily.        . metFORMIN (GLUCOPHAGE) 850 MG tablet Take 850 mg by mouth daily with breakfast.       . pantoprazole (PROTONIX) 40 MG tablet Take 40 mg by mouth daily.        . simvastatin (ZOCOR) 40 MG tablet Take 40 mg by mouth at bedtime.           Allergies  Allergen Reactions  . Shellfish Allergy Anaphylaxis  . Decadron (Dexamethasone) Nausea And Vomiting  . Erythromycin Itching  . Iohexol      Desc: pt states reaction is anaphalactic shock from IV dye and any kind of shellfish. pt states she almost died the last time she had a reaction.   Marland Kitchen  Keflex Nausea And Vomiting  . Penicillins Itching  . Prednisone Nausea And Vomiting  . Questran Nausea And Vomiting  . Robaxin Itching and Nausea And Vomiting  . Toradol Other (See Comments)    unknown    History   Social History  . Marital Status: Married    Spouse Name: N/A    Number of Children: N/A  . Years of Education: N/A   Occupational History  . Not on file.   Social History Main Topics  . Smoking status: Never Smoker   . Smokeless tobacco: Not on file  . Alcohol Use: No  . Drug Use: No  . Sexually Active:    Other Topics Concern  . Not on file   Social History Narrative  . No narrative on file    Family History  Problem Relation Age of Onset  . Coronary artery disease Mother   . Cancer Father   . Brain cancer Brother     PHYSICAL EXAM: Filed Vitals:   07/24/11 0100  BP: 105/60  Pulse: 75  Temp:   Resp: 16   General:  Well appearing. No respiratory difficulty HEENT: normal Neck: supple. no JVD. Carotids 2+ bilat; no  bruits. No lymphadenopathy or thryomegaly appreciated. Cor: PMI nondisplaced. Regular rate & rhythm. No rubs, gallops or murmurs. Lungs: clear Abdomen: soft, nontender, nondistended. No hepatosplenomegaly. No bruits or masses. Good bowel sounds. Extremities: no cyanosis, clubbing, rash, edema Neuro: alert & oriented x 3, cranial nerves grossly intact. moves all 4 extremities w/o difficulty. Affect pleasant.  ECG: (1646) - sinus rhythm with STD/TWI in V3-V6 and inferior leads  Results for orders placed during the hospital encounter of 07/23/11 (from the past 24 hour(s))  CBC     Status: Abnormal   Collection Time   07/23/11  5:35 PM      Component Value Range   WBC 11.4 (*) 4.0 - 10.5 (K/uL)   RBC 4.52  3.87 - 5.11 (MIL/uL)   Hemoglobin 13.0  12.0 - 15.0 (g/dL)   HCT 16.1  09.6 - 04.5 (%)   MCV 85.6  78.0 - 100.0 (fL)   MCH 28.8  26.0 - 34.0 (pg)   MCHC 33.6  30.0 - 36.0 (g/dL)   RDW 40.9  81.1 - 91.4 (%)   Platelets 257  150 - 400 (K/uL)  DIFFERENTIAL     Status: Normal   Collection Time   07/23/11  5:35 PM      Component Value Range   Neutrophils Relative 65  43 - 77 (%)   Neutro Abs 7.4  1.7 - 7.7 (K/uL)   Lymphocytes Relative 27  12 - 46 (%)   Lymphs Abs 3.1  0.7 - 4.0 (K/uL)   Monocytes Relative 6  3 - 12 (%)   Monocytes Absolute 0.7  0.1 - 1.0 (K/uL)   Eosinophils Relative 1  0 - 5 (%)   Eosinophils Absolute 0.1  0.0 - 0.7 (K/uL)   Basophils Relative 0  0 - 1 (%)   Basophils Absolute 0.0  0.0 - 0.1 (K/uL)  BASIC METABOLIC PANEL     Status: Abnormal   Collection Time   07/23/11  5:35 PM      Component Value Range   Sodium 136  135 - 145 (mEq/L)   Potassium 3.5  3.5 - 5.1 (mEq/L)   Chloride 97  96 - 112 (mEq/L)   CO2 26  19 - 32 (mEq/L)   Glucose, Bld 199 (*) 70 - 99 (mg/dL)  BUN 14  6 - 23 (mg/dL)   Creatinine, Ser 9.14  0.50 - 1.10 (mg/dL)   Calcium 9.7  8.4 - 78.2 (mg/dL)   GFR calc non Af Amer 75 (*) >90 (mL/min)   GFR calc Af Amer 87 (*) >90 (mL/min)    PROTIME-INR     Status: Normal   Collection Time   07/23/11  5:35 PM      Component Value Range   Prothrombin Time 12.5  11.6 - 15.2 (seconds)   INR 0.91  0.00 - 1.49   POCT I-STAT TROPONIN I     Status: Normal   Collection Time   07/23/11  6:27 PM      Component Value Range   Troponin i, poc 0.00  0.00 - 0.08 (ng/mL)   Comment 3           POCT I-STAT TROPONIN I     Status: Normal   Collection Time   07/23/11  9:59 PM      Component Value Range   Troponin i, poc 0.00  0.00 - 0.08 (ng/mL)   Comment 3            Dg Chest 2 View  07/23/2011  *RADIOLOGY REPORT*  Clinical Data: Chest pain  CHEST - 2 VIEW  Comparison: 12/25/2010  Findings: Stable left subclavian pacemaker and leads.  Heart is moderately enlarged.  Pulmonary vascularity is within normal limits.  Low lung volumes and bibasilar atelectasis.  No sign of interstitial edema.  IMPRESSION: Cardiomegaly without edema.  Bibasilar atelectasis.  Original Report Authenticated By: Donavan Burnet, M.D.     ASSESSMENT / PLAN / DISCUSSION: 28 YOWF with PMH of DM, HTN, HLD, SSS s/p pacer who presents with chest pain for 24 hours in the setting of nausea, vomiting, anterolateral and inferior EKG changes and normal troponins.  1. Chest pain - her presentation is atypical for ACS given negative troponins and significant GI complaints (nausea and diarrhea).  However, she does have EKG changes that are more prominent today.  It should be noted that the previous EKG was atrially paced, but not paced in the ventricle which makes these EKG changes more concerning.  While these EKG changes may be due to pacemaker memory, Takotsubo cardiomyopathy (from GI illness), we will go ahead and treat her with IV NTG (low dose) and IV heparin until she rules out for MI.  She is already of ASA, BB, statin.  If she rules out for MI, recommend inpatient stress test.  If her CE rise, then we would need to consider her for cath.  We will follow along with you.

## 2011-07-24 NOTE — Progress Notes (Signed)
Subjective:  60 yo with hx of DM -II, HTN, hyperlipidemia.  Presented to the ER with hours of nausea, vomiting, and  Diarrhea.  Complains of pain and tenderness in her lower abdomen and chest.  She also complains of unrelated left shoulder, left neck and left arm tingling and numbness.  Initial Troponins are negative.  Lab has had difficulty in drawing further bloodwork. She continues to have nausea relieved with zofran.  Objective:  Vital Signs in the last 24 hours: Blood pressure 113/74, pulse 67, temperature 98.4 F (36.9 C), temperature source Oral, resp. rate 10, height 5\' 5"  (1.651 m), weight 205 lb 4 oz (93.1 kg), SpO2 96.00%. Temp:  [97.7 F (36.5 C)-98.6 F (37 C)] 98.4 F (36.9 C) (11/05 0345) Pulse Rate:  [64-75] 67  (11/05 0630) Resp:  [8-18] 10  (11/05 0630) BP: (96-131)/(45-107) 113/74 mmHg (11/05 0630) SpO2:  [91 %-98 %] 96 % (11/05 0630) Weight:  [205 lb 4 oz (93.1 kg)] 205 lb 4 oz (93.1 kg) (11/05 0345)     . aspirin  324 mg Oral Once  . aspirin EC  325 mg Oral Daily  . atenolol  25 mg Oral Daily  . famotidine  20 mg Oral Daily  . heparin  4,000 Units Intravenous Once  . lisinopril  10 mg Oral Daily   And  . hydrochlorothiazide  12.5 mg Oral Daily  . insulin aspart  0-5 Units Subcutaneous QHS  . insulin aspart  0-9 Units Subcutaneous TID WC  . metFORMIN  850 mg Oral Q breakfast  . morphine  4 mg Intravenous Once  . morphine  4 mg Intravenous Once  . ondansetron  4 mg Intravenous Once  . ondansetron  4 mg Intravenous Once  . pantoprazole  40 mg Oral Daily  . simvastatin  40 mg Oral QHS  . sodium chloride  3 mL Intravenous Q12H  . DISCONTD: enoxaparin  40 mg Subcutaneous Q24H  . DISCONTD: lisinopril-hydrochlorothiazide  1 tablet Oral Daily      . sodium chloride    . heparin 1,050 Units/hr (07/24/11 0230)  . nitroGLYCERIN 10 mcg/min (07/24/11 0304)    Intake/Output from previous day: 11/04 0701 - 11/05 0700 In: 403.3 [P.O.:240; I.V.:163.3] Out:  200 [Urine:200] Intake/Output from this shift:    Physical Exam: The patient is alert and oriented x 3.  The mood and affect are normal.   Skin: warm and dry.  Color is normal.    HEENT:   the sclera are nonicteric.  The mucous membranes are moist.  The carotids are 2+ without bruits.  There is no thyromegaly.  There is no JVD.    Lungs: clear.  The chest wall is non tender.    Heart: regular rate with a normal S1 and S2.  There are no murmurs, gallops, or rubs. The PMI is not displaced.     Abdomen: good bowel sounds.  There is no guarding or rebound. She is tender in her lower abdomen.   There are no masses.   Extremities:  no clubbing, cyanosis, or edema.  The legs are without rashes.  The distal pulses are intact.   Neuro:  Cranial nerves II - XII are intact.  Motor and sensory functions are intact.     Lab Results:  [facility] 07/23/11 1735  WBC 11.4*  HGB 13.0  PLT 257    Basename 07/23/11 1735  NA 136  K 3.5  CL 97  CO2 26  GLUCOSE 199*  BUN 14  CREATININE 0.83   No results found for this basename: TROPONINI:2,CK,MB:2 in the last 72 hours No results found for this basename: BNP in the last 72 hours Hepatic Function Panel No results found for this basename: PROT,ALBUMIN,AST,ALT,ALKPHOS,BILITOT,BILIDIR,IBILI in the last 72 hours No results found for this basename: CHOL in the last 72 hours No results found for this basename: PROTIME in the last 72 hours   Cardiac Studies: NSR. RBBB. TWI in inferior and anterior/lateral leads  Assessment/Plan:   1. Chest Pain.  2.  Abdomina pain.    3. Diabetes Mellitus - type 2   Her symptoms are very atypical.  Her primary complaint is nausea and vomiting.  She has had prolonged discomfort, nausea, and vomiting.  If her Troponin and CK/MB are still, negative, this would support a non cardiac etiology.  If the enzymes are abnormal, will schedule for cardiac cath.  Her main complaint is that of lower abdominal pain and  tenderness.  No LFTs drawn in ER.   She is s/p cholecystectomy. Her glucose levels are elevated.  Internal medicine to address.     Vesta Mixer, Montez Hageman., MD, [facility] 07/24/2011, 7:30 AM

## 2011-07-24 NOTE — Progress Notes (Signed)
ANTICOAGULATION CONSULT NOTE - Follow Up Consult  Pharmacy Consult for Heparin Indication: chest pain/ACS  Allergies  Allergen Reactions  . Shellfish Allergy Anaphylaxis  . Decadron (Dexamethasone) Nausea And Vomiting  . Erythromycin Itching  . Iohexol      Desc: pt states reaction is anaphalactic shock from IV dye and any kind of shellfish. pt states she almost died the last time she had a reaction.   Marland Kitchen Keflex Nausea And Vomiting  . Penicillins Itching  . Prednisone Nausea And Vomiting  . Questran Nausea And Vomiting  . Robaxin Itching and Nausea And Vomiting  . Toradol Other (See Comments)    unknown    Patient Measurements: Height: 5\' 5"  (165.1 cm) Weight: 205 lb 4 oz (93.1 kg) IBW/kg (Calculated) : 57  Adjusted Body Weight:  Vital Signs: Temp: 98.4 F (36.9 C) (11/05 1700) Temp src: Oral (11/05 1700) BP: 117/66 mmHg (11/05 1022) Pulse Rate: 73  (11/05 1022)  Labs:  Basename 07/24/11 1652 07/24/11 1648 07/24/11 0918 07/24/11 0915 07/24/11 0914 07/23/11 1735  HGB -- -- -- -- -- 13.0  HCT -- -- -- -- -- 38.7  PLT -- -- -- -- -- 257  APTT -- -- -- -- -- --  LABPROT -- -- -- -- -- 12.5  INR -- -- -- -- -- 0.91  HEPARINUNFRC -- 0.39 0.18* -- -- --  CREATININE -- -- -- -- 0.92 0.83  CKTOTAL 221* -- -- 210* -- --  CKMB 2.5 -- -- 2.3 -- --  TROPONINI <0.30 -- -- <0.30 -- --   Estimated Creatinine Clearance: 73.3 ml/min (by C-G formula based on Cr of 0.92).   Medications:  Scheduled:    . aspirin EC  325 mg Oral Daily  . atenolol  25 mg Oral Daily  . famotidine  20 mg Oral Daily  . heparin  4,000 Units Intravenous Once  . lisinopril  10 mg Oral Daily   And  . hydrochlorothiazide  12.5 mg Oral Daily  . insulin aspart  0-5 Units Subcutaneous QHS  . insulin aspart  0-9 Units Subcutaneous TID WC  . metFORMIN  850 mg Oral Q breakfast  . morphine  4 mg Intravenous Once  . morphine  4 mg Intravenous Once  . ondansetron  4 mg Intravenous Once  . ondansetron  4  mg Intravenous Once  . pantoprazole  40 mg Oral Daily  . simvastatin  40 mg Oral QHS  . sodium chloride  3 mL Intravenous Q12H  . DISCONTD: enoxaparin  40 mg Subcutaneous Q24H  . DISCONTD: lisinopril-hydrochlorothiazide  1 tablet Oral Daily    Assessment: 60 YOF admitted with atypical chest pain, started on heparin gtt, recheck of heparin level following rate change reveals therapeutic level = 0.39. Cardiac enzymes unrevealing to date.  Goal of Therapy:  Heparin level 0.3-0.7 units/ml   Plan:  Heparin level is therapeutic.  Follow-up with labs in am.   Dannielle Huh 07/24/2011,6:08 PM

## 2011-07-24 NOTE — ED Notes (Signed)
Report called to [place], [place] on 2600.

## 2011-07-24 NOTE — Progress Notes (Signed)
EKG showed "ACUTE MI." MD notified. New order received. [name] 07/24/2011 1530

## 2011-07-24 NOTE — ED Notes (Signed)
RN unable to come to phone at this time.  Name and number given to 2600 secretary.

## 2011-07-24 NOTE — Progress Notes (Signed)
ANTICOAGULATION CONSULT NOTE - Follow Up Consult  Pharmacy Consult for Heparin Indication: chest pain/ACS  Allergies  Allergen Reactions  . Shellfish Allergy Anaphylaxis  . Decadron (Dexamethasone) Nausea And Vomiting  . Erythromycin Itching  . Iohexol      Desc: pt states reaction is anaphalactic shock from IV dye and any kind of shellfish. pt states she almost died the last time she had a reaction.   Marland Kitchen Keflex Nausea And Vomiting  . Penicillins Itching  . Prednisone Nausea And Vomiting  . Questran Nausea And Vomiting  . Robaxin Itching and Nausea And Vomiting  . Toradol Other (See Comments)    unknown    Patient Measurements: Height: 5\' 5"  (165.1 cm) Weight: 205 lb 4 oz (93.1 kg) IBW/kg (Calculated) : 57  Adjusted Body Weight:   Vital Signs: Temp: 98 F (36.7 C) (11/05 0818) Temp src: Oral (11/05 0818) BP: 117/66 mmHg (11/05 1022) Pulse Rate: 73  (11/05 1022)  Labs:  [facility] 07/24/11 0918 07/23/11 1735  HGB -- 13.0  HCT -- 38.7  PLT -- 257  APTT -- --  LABPROT -- 12.5  INR -- 0.91  HEPARINUNFRC 0.18* --  CREATININE -- 0.83  CKTOTAL -- --  CKMB -- --  TROPONINI -- --   Estimated Creatinine Clearance: 81.2 ml/min (by C-G formula based on Cr of 0.83).    Assessment: Heparin level below goal of 0.3-0.7 Goal of Therapy:  Heparin level 0.3-0.7 units/ml   Plan:  Increase heparin rate to 1300 units/hour. Heparin level 6 hours later  Mickeal Skinner 07/24/2011,10:59 AM

## 2011-07-24 NOTE — Progress Notes (Signed)
ANTICOAGULATION CONSULT NOTE - Initial Consult  Pharmacy Consult for Heparin Indication: chest pain/ACS  Allergies  Allergen Reactions  . Shellfish Allergy Anaphylaxis  . Decadron (Dexamethasone) Nausea And Vomiting  . Erythromycin Itching  . Iohexol      Desc: pt states reaction is anaphalactic shock from IV dye and any kind of shellfish. pt states she almost died the last time she had a reaction.   Marland Kitchen Keflex Nausea And Vomiting  . Penicillins Itching  . Prednisone Nausea And Vomiting  . Questran Nausea And Vomiting  . Robaxin Itching and Nausea And Vomiting  . Toradol Other (See Comments)    unknown    Patient Measurements: Pt stated wt ~87kg, Ht 65" Heparin Dosing Weight: 76  Vital Signs: Temp: 97.7 F (36.5 C) (11/04 2204) Temp src: Oral (11/04 2204) BP: 105/60 mmHg (11/05 0100) Pulse Rate: 75  (11/05 0100)  Labs:  Basename 07/23/11 1735  HGB 13.0  HCT 38.7  PLT 257  APTT --  LABPROT 12.5  INR 0.91  HEPARINUNFRC --  CREATININE 0.83  CKTOTAL --  CKMB --  TROPONINI --   CrCl ~90 ml/min  Medical History: Past Medical History  Diagnosis Date  . Hypertension   . High cholesterol   . Pacemaker   . Diabetes mellitus   . Arthritis   . Headache     Medications:  Home Meds pending  Assessment: 60 y.o. female presents with nausea/chest pain. To begin heparin for ACS  Goal of Therapy:  Heparin level 0.3-0.7 units/ml   Plan:  1) Heparin bolus 4000 units IV 2) Heparin gtt at 1050 units/hr 3) Heparin level in 6 hr 4) Daily heparin level and CBC  [name], [name] 07/24/2011,1:56 AM

## 2011-07-24 NOTE — Progress Notes (Signed)
07/24/2011 Utilization review completed. Cincere Deprey SPARKS [phone]  

## 2011-07-24 NOTE — Progress Notes (Signed)
  Subjective: Patient seen.Still complain of chest pain radiating to the neck and the lt arm. Denies any son  Objective: Weight change:   Intake/Output Summary (Last 24 hours) at 07/24/11 1232 Last data filed at 07/24/11 1022  Gross per 24 hour  Intake 446.75 ml  Output    200 ml  Net 246.75 ml   BP 117/66  Pulse 73  Temp(Src) 97.9 F (36.6 C) (Oral)  Resp 11  Ht 5\' 5"  (1.651 m)  Wt 93.1 kg (205 lb 4 oz)  BMI 34.16 kg/m2  SpO2 96% Physical Exam: General appearance: alert, cooperative and no distress Head: Normocephalic, without obvious abnormality, atraumatic Neck: no adenopathy, no carotid bruit, no JVD, supple, symmetrical, trachea midline and thyroid not enlarged, symmetric, no tenderness/mass/nodules Lungs: clear to auscultation bilaterally Heart: regular rate and rhythm, S1, S2 normal, no murmur, click, rub or gallop Abdomen: soft, non-tender; bowel sounds normal; no masses,  no organomegaly Extremities: extremities normal, atraumatic, no cyanosis or edema Skin: Skin color, texture, turgor normal. No rashes or lesions  Lab Results: @labtest @  Micro Results: Recent Results (from the past 240 hour(s))  MRSA PCR SCREENING     Status: Normal   Collection Time   07/24/11  3:28 AM      Component Value Range Status Comment   MRSA by PCR NEGATIVE  NEGATIVE  Final     Studies/Results: Dg Chest 2 View  07/23/2011  *RADIOLOGY REPORT*  Clinical Data: Chest pain  CHEST - 2 VIEW  Comparison: 12/25/2010  Findings: Stable left subclavian pacemaker and leads.  Heart is moderately enlarged.  Pulmonary vascularity is within normal limits.  Low lung volumes and bibasilar atelectasis.  No sign of interstitial edema.  IMPRESSION: Cardiomegaly without edema.  Bibasilar atelectasis.  Original Report Authenticated By: Donavan Burnet, M.D.   Medications: Scheduled Meds:   . aspirin  324 mg Oral Once  . aspirin EC  325 mg Oral Daily  . atenolol  25 mg Oral Daily  . famotidine  20 mg  Oral Daily  . heparin  4,000 Units Intravenous Once  . lisinopril  10 mg Oral Daily   And  . hydrochlorothiazide  12.5 mg Oral Daily  . insulin aspart  0-5 Units Subcutaneous QHS  . insulin aspart  0-9 Units Subcutaneous TID WC  . metFORMIN  850 mg Oral Q breakfast  . morphine  4 mg Intravenous Once  . morphine  4 mg Intravenous Once  . ondansetron  4 mg Intravenous Once  . ondansetron  4 mg Intravenous Once  . pantoprazole  40 mg Oral Daily  . simvastatin  40 mg Oral QHS  . sodium chloride  3 mL Intravenous Q12H  . DISCONTD: enoxaparin  40 mg Subcutaneous Q24H  . DISCONTD: lisinopril-hydrochlorothiazide  1 tablet Oral Daily   Continuous Infusions:   . sodium chloride    . heparin 1,300 Units/hr (07/24/11 1159)  . nitroGLYCERIN 10 mcg/min (07/24/11 0304)   PRN Meds:.acetaminophen, acetaminophen, diazepam, HYDROmorphone, nitroGLYCERIN, ondansetron (ZOFRAN) IV, ondansetron, oxyCODONE, sodium chloride, zolpidem  Assessment/Plan: 1.Chest pain?angina. 2.DM 3.HTN. 4.Hyperlipidema. 5.Overweight. Plan-To continue iv heparin and nitro drip and further recommendation from cardiology   LOS: 1 day   Rylen Swindler 07/24/2011, 12:32 PM

## 2011-07-25 LAB — GLUCOSE, CAPILLARY: Glucose-Capillary: 167 mg/dL — ABNORMAL HIGH (ref 70–99)

## 2011-07-25 LAB — CBC
HCT: 38.8 % (ref 36.0–46.0)
Platelets: 304 10*3/uL (ref 150–400)
RBC: 4.36 MIL/uL (ref 3.87–5.11)
RDW: 14.5 % (ref 11.5–15.5)
WBC: 10.9 10*3/uL — ABNORMAL HIGH (ref 4.0–10.5)

## 2011-07-25 LAB — CARDIAC PANEL(CRET KIN+CKTOT+MB+TROPI)
Relative Index: 1.4 (ref 0.0–2.5)
Total CK: 181 U/L — ABNORMAL HIGH (ref 7–177)

## 2011-07-25 LAB — HEPARIN LEVEL (UNFRACTIONATED): Heparin Unfractionated: 0.57 IU/mL (ref 0.30–0.70)

## 2011-07-25 MED ORDER — METAXALONE 800 MG PO TABS
400.0000 mg | ORAL_TABLET | Freq: Three times a day (TID) | ORAL | Status: DC
  Administered 2011-07-25 – 2011-07-26 (×2): 400 mg via ORAL
  Filled 2011-07-25 (×7): qty 0.5

## 2011-07-25 NOTE — Progress Notes (Signed)
Subjective:  60 yo with hx of DM -II, HTN, hyperlipidemia.  Presented to the ER with hours of nausea, vomiting, and  Diarrhea.  Complains of pain and tenderness in her lower abdomen and chest.  She also complains of unrelated left shoulder, left neck and left arm tingling and numbness.  Cardiac enzymes are negative despite hours of constant chest pain.  She continues to have nausea relieved with zofran.  Objective:  Vital Signs in the last 24 hours: Blood pressure 80/46, pulse 67, temperature 98.5 F (36.9 C), temperature source Oral, resp. rate 9, height 5\' 5"  (1.651 m), weight 208 lb 5.4 oz (94.5 kg), SpO2 93.00%. Temp:  [97.8 F (36.6 C)-98.5 F (36.9 C)] 98.5 F (36.9 C) (11/06 0435) Pulse Rate:  [66-85] 67  (11/06 0600) Resp:  [6-22] 9  (11/06 0600) BP: (80-117)/(46-66) 80/46 mmHg (11/06 0600) SpO2:  [70 %-98 %] 93 % (11/06 0600) Weight:  [208 lb 5.4 oz (94.5 kg)] 208 lb 5.4 oz (94.5 kg) (11/06 0121)     . aspirin EC  325 mg Oral Daily  . atenolol  25 mg Oral Daily  . famotidine  20 mg Oral Daily  . lisinopril  10 mg Oral Daily   And  . hydrochlorothiazide  12.5 mg Oral Daily  . insulin aspart  0-5 Units Subcutaneous QHS  . insulin aspart  0-9 Units Subcutaneous TID WC  . metFORMIN  850 mg Oral Q breakfast  . pantoprazole  40 mg Oral Daily  . simvastatin  40 mg Oral QHS  . sodium chloride  3 mL Intravenous Q12H      . sodium chloride 250 mL (07/24/11 1300)  . heparin 1,300 Units/hr (07/24/11 2119)  . nitroGLYCERIN 10 mcg/min (07/24/11 1300)    Intake/Output from previous day: 11/05 0701 - 11/06 0700 In: 1564.5 [P.O.:950; I.V.:614.5] Out: 1200 [Urine:850; Emesis/NG output:350] Intake/Output from this shift: Total I/O In: 49.5 [P.O.:30; I.V.:19.5] Out: -   Physical Exam: The patient is alert and oriented x 3.  The mood and affect are normal.   Skin: warm and dry.  Color is normal.    HEENT:   the sclera are nonicteric.  The mucous membranes are moist.   The carotids are 2+ without bruits.  There is no thyromegaly.  There is no JVD.    Lungs: clear.  The chest wall is non tender.    Heart: regular rate with a normal S1 and S2.  There are no murmurs, gallops, or rubs. The PMI is not displaced.     Abdomen: good bowel sounds.  There is no guarding or rebound. She is tender in her lower abdomen.   There are no masses.   Extremities:  no clubbing, cyanosis, or edema.  The legs are without rashes.  The distal pulses are intact.   Neuro:  Cranial nerves II - XII are intact.  Motor and sensory functions are intact.     Lab Results:  Basename 07/25/11 0520 07/23/11 1735  WBC 10.9* 11.4*  HGB 12.5 13.0  PLT 304 257    Basename 07/24/11 0914 07/23/11 1735  NA 139 136  K 3.8 3.5  CL 103 97  CO2 23 26  GLUCOSE 152* 199*  BUN 13 14  CREATININE 0.92 0.83    Basename 07/25/11 0051 07/24/11 1652  TROPONINI <0.30 <0.30   Lab Results  Component Value Date   HGBA1C 10.1* 07/24/2011        Basename 07/24/11 0914  PROT 7.7  ALBUMIN  3.5  AST 33  ALT 31  ALKPHOS 242*  BILITOT 0.4  BILIDIR <0.1  IBILI NOT CALCULATED     Cardiac Studies: NSR. RBBB. TWI in inferior and anterior/lateral leads  Assessment/Plan:   1. Chest Pain.  2.  Abdomina pain.    3. Diabetes Mellitus - type 2   Her symptoms are very atypical.  Her primary complaint is nausea and vomiting.  She has had prolonged discomfort, nausea, and vomiting.    She has ruled our for MI - even after having hours of unrelenting chest pain.  This strongly suggests a non cardiac etiology.  Will sign off.  Pt may be DC. Follow up with her medical doctor and cardiologist in Mardene Speak, Montez Hageman., MD, [facility] 07/25/2011, 7:55 AM

## 2011-07-25 NOTE — Progress Notes (Signed)
Inpatient Diabetes Program Recommendations  AACE/ADA: New Consensus Statement on Inpatient Glycemic Control (2009)  Target Ranges:  Prepandial:   less than 140 mg/dL      Peak postprandial:   less than 180 mg/dL (1-2 hours)      Critically ill patients:  140 - 180 mg/dL   Reason for Visit: Elevated HgbA1C:  10.1%  Inpatient Diabetes Program Recommendations Outpatient Referral: Please order outpatient diabetes education  Note: May also consider diabetes medication adjustment prior to discharge

## 2011-07-25 NOTE — Progress Notes (Signed)
  Subjective: Patient seen.Occasional chest pain.Denies sob.Pain reproducible  Objective: Weight change: 1.4 kg (3 lb 1.4 oz)  Intake/Output Summary (Last 24 hours) at 07/25/11 1513 Last data filed at 07/25/11 1300  Gross per 24 hour  Intake 1264.04 ml  Output    700 ml  Net 564.04 ml   BP 109/70  Pulse 74  Temp(Src) 98.1 F (36.7 C) (Oral)  Resp 19  Ht 5\' 5"  (1.651 m)  Wt 94.5 kg (208 lb 5.4 oz)  BMI 34.67 kg/m2  SpO2 97% Physical Exam: General appearance: alert, cooperative and no distress Head: Normocephalic, without obvious abnormality, atraumatic Neck: no adenopathy, no carotid bruit, no JVD, supple, symmetrical, trachea midline and thyroid not enlarged, symmetric, no tenderness/mass/nodules Lungs: clear to auscultation bilaterally Heart: regular rate and rhythm, S1, S2 normal, no murmur, click, rub or gallop Abdomen: soft, non-tender; bowel sounds normal; no masses,  no organomegaly Extremities: extremities normal, atraumatic, no cyanosis or edema Skin: Skin color, texture, turgor normal. No rashes or lesions  Lab Results: @labtest @  Micro Results: Recent Results (from the past 240 hour(s))  MRSA PCR SCREENING     Status: Normal   Collection Time   07/24/11  3:28 AM      Component Value Range Status Comment   MRSA by PCR NEGATIVE  NEGATIVE  Final     Studies/Results: Dg Chest 2 View  07/23/2011  *RADIOLOGY REPORT*  Clinical Data: Chest pain  CHEST - 2 VIEW  Comparison: 12/25/2010  Findings: Stable left subclavian pacemaker and leads.  Heart is moderately enlarged.  Pulmonary vascularity is within normal limits.  Low lung volumes and bibasilar atelectasis.  No sign of interstitial edema.  IMPRESSION: Cardiomegaly without edema.  Bibasilar atelectasis.  Original Report Authenticated By: Donavan Burnet, M.D.   Medications: Scheduled Meds:   . aspirin EC  325 mg Oral Daily  . atenolol  25 mg Oral Daily  . famotidine  20 mg Oral Daily  . lisinopril  10 mg Oral  Daily   And  . hydrochlorothiazide  12.5 mg Oral Daily  . insulin aspart  0-5 Units Subcutaneous QHS  . insulin aspart  0-9 Units Subcutaneous TID WC  . metFORMIN  850 mg Oral Q breakfast  . pantoprazole  40 mg Oral Daily  . simvastatin  40 mg Oral QHS  . sodium chloride  3 mL Intravenous Q12H   Continuous Infusions:   . sodium chloride 250 mL (07/24/11 1300)  . heparin 1,300 Units/hr (07/24/11 2119)  . nitroGLYCERIN 10 mcg/min (07/24/11 1300)   PRN Meds:.acetaminophen, acetaminophen, diazepam, HYDROmorphone, nitroGLYCERIN, ondansetron (ZOFRAN) IV, ondansetron, oxyCODONE, promethazine, sodium chloride, zolpidem  Assessment/Plan: 1.Chest Pain-Most likely non cardiac. 2.DM 3.HTN-BP stable 4.Hyperlipidemia. Plan-Cardiac enzymes are negative.Cardiology signed off.Will give skelaxin and  For possible d/c in a.m   LOS: 2 days   [name] 07/25/2011, 3:13 PM

## 2011-07-25 NOTE — Progress Notes (Signed)
ANTICOAGULATION CONSULT NOTE - Follow Up Consult  Pharmacy Consult for heparin Indication: chest pain/ACS  Allergies  Allergen Reactions  . Shellfish Allergy Anaphylaxis  . Decadron (Dexamethasone) Nausea And Vomiting  . Erythromycin Itching  . Iohexol      Desc: pt states reaction is anaphalactic shock from IV dye and any kind of shellfish. pt states she almost died the last time she had a reaction.   Marland Kitchen Keflex Nausea And Vomiting  . Penicillins Itching  . Prednisone Nausea And Vomiting  . Questran Nausea And Vomiting  . Robaxin Itching and Nausea And Vomiting  . Toradol Other (See Comments)    unknown    Patient Measurements: Height: 5\' 5"  (165.1 cm) Weight: 208 lb 5.4 oz (94.5 kg) IBW/kg (Calculated) : 57  Adjusted Body Weight:   Vital Signs: Temp: 99.3 F (37.4 C) (11/06 0800) BP: 80/46 mmHg (11/06 0600) Pulse Rate: 67  (11/06 0600)  Labs:  Basename 07/25/11 0520 07/25/11 0051 07/24/11 1652 07/24/11 1648 07/24/11 0918 07/24/11 0915 07/24/11 0914 07/23/11 1735  HGB 12.5 -- -- -- -- -- -- 13.0  HCT 38.8 -- -- -- -- -- -- 38.7  PLT 304 -- -- -- -- -- -- 257  APTT -- -- -- -- -- -- -- --  LABPROT -- -- -- -- -- -- -- 12.5  INR -- -- -- -- -- -- -- 0.91  HEPARINUNFRC 0.57 -- -- 0.39 0.18* -- -- --  CREATININE -- -- -- -- -- -- 0.92 0.83  CKTOTAL -- 181* 221* -- -- 210* -- --  CKMB -- 2.5 2.5 -- -- 2.3 -- --  TROPONINI -- <0.30 <0.30 -- -- <0.30 -- --   Estimated Creatinine Clearance: 73.9 ml/min (by C-G formula based on Cr of 0.92).   Assessment: Heparin level therapeutic Goal of Therapy:  Heparin level 0.3-0.7 units/ml   Plan:  Continue heparin at the same rate.  Daily heparin level and CBC while on Heparin.  Mickeal Skinner 07/25/2011,10:03 AM

## 2011-07-26 LAB — CBC
HCT: 35.6 % — ABNORMAL LOW (ref 36.0–46.0)
MCH: 28.1 pg (ref 26.0–34.0)
MCHC: 31.5 g/dL (ref 30.0–36.0)
MCV: 89.4 fL (ref 78.0–100.0)
Platelets: 254 10*3/uL (ref 150–400)
RDW: 14.5 % (ref 11.5–15.5)
WBC: 9.7 10*3/uL (ref 4.0–10.5)

## 2011-07-26 LAB — GLUCOSE, CAPILLARY: Glucose-Capillary: 148 mg/dL — ABNORMAL HIGH (ref 70–99)

## 2011-07-26 MED ORDER — ACETAMINOPHEN 325 MG PO TABS: 650.0000 mg | ORAL_TABLET | Freq: Four times a day (QID) | ORAL | Status: AC | PRN

## 2011-07-26 MED ORDER — ONDANSETRON HCL 4 MG PO TABS: 4.0000 mg | ORAL_TABLET | Freq: Four times a day (QID) | ORAL | Status: AC | PRN

## 2011-07-26 MED ORDER — NITROGLYCERIN 0.4 MG SL SUBL: 0.4000 mg | SUBLINGUAL_TABLET | SUBLINGUAL | Status: DC | PRN

## 2011-07-26 NOTE — Progress Notes (Signed)
60yo female admitted on 07/23/11 with substernal chest pain, radiation, nausea, and diaphoresis. Prior to admission, patient lived at home with spouse and family support. No home needs identified. Anticipated discharge home today with self care.    [facility], Yann Biehn [facility] 07/26/2011 [phone]

## 2011-07-26 NOTE — Discharge Summary (Signed)
Physician Discharge Summary  Patient ID: [name] MRN: [id number] DOB/AGE: May 05, 1951 60 y.o. Primary Care Physician:No primary provider on file. Admit date: 07/23/2011 Discharge date: 07/26/2011    Discharge Diagnoses:   Principal Problem:  *Chest pain syndrome  Active Problems:  Diabetes mellitus type 2, controlled  Hypertension, benign  Hyperlipemia, mixed  Sick Sinus Syndrome-S/P PPM implantation   Current Discharge Medication List    START taking these medications   Details  acetaminophen (TYLENOL) 325 MG tablet Take 2 tablets (650 mg total) by mouth every 6 (six) hours as needed (or Fever >/= 101). Qty: 30 tablet, Refills: 0    nitroGLYCERIN (NITROSTAT) 0.4 MG SL tablet Place 1 tablet (0.4 mg total) under the tongue every 5 (five) minutes as needed for chest pain. Qty: 10 tablet, Refills: 0    ondansetron (ZOFRAN) 4 MG tablet Take 1 tablet (4 mg total) by mouth every 6 (six) hours as needed for nausea. Qty: 20 tablet, Refills: 0      CONTINUE these medications which have NOT CHANGED   Details  aspirin 81 MG tablet Take 81 mg by mouth daily.      atenolol (TENORMIN) 25 MG tablet Take 25 mg by mouth daily.      diazepam (VALIUM) 10 MG tablet Take 10 mg by mouth 2 (two) times daily as needed. For anxiety    lisinopril-hydrochlorothiazide (PRINZIDE,ZESTORETIC) 10-12.5 MG per tablet Take 1 tablet by mouth daily.      metFORMIN (GLUCOPHAGE) 850 MG tablet Take 850 mg by mouth daily with breakfast.     pantoprazole (PROTONIX) 40 MG tablet Take 40 mg by mouth daily.      simvastatin (ZOCOR) 40 MG tablet Take 40 mg by mouth at bedtime.        STOP taking these medications     famotidine (PEPCID) 20 MG tablet         Discharged Condition: Much improved, chest pain significantly better    Consults: [facility]  Significant Diagnostic Studies: Dg Chest 2 View  07/23/2011  *RADIOLOGY REPORT*  Clinical Data: Chest pain  CHEST - 2 VIEW   Comparison: 12/25/2010  Findings: Stable left subclavian pacemaker and leads.  Heart is moderately enlarged.  Pulmonary vascularity is within normal limits.  Low lung volumes and bibasilar atelectasis.  No sign of interstitial edema.  IMPRESSION: Cardiomegaly without edema.  Bibasilar atelectasis.  Original Report Authenticated By: Donavan Burnet, M.D.    Lab Results: Results for orders placed during the hospital encounter of 07/23/11 (from the past 48 hour(s))  BASIC METABOLIC PANEL     Status: Abnormal   Collection Time   07/24/11  9:14 AM      Component Value Range Comment   Sodium 139  135 - 145 (mEq/L)    Potassium 3.8  3.5 - 5.1 (mEq/L)    Chloride 103  96 - 112 (mEq/L)    CO2 23  19 - 32 (mEq/L)    Glucose, Bld 152 (*) 70 - 99 (mg/dL)    BUN 13  6 - 23 (mg/dL)    Creatinine, Ser 8.65  0.50 - 1.10 (mg/dL)    Calcium 9.0  8.4 - 10.5 (mg/dL)    GFR calc non Af Amer 66 (*) >90 (mL/min)    GFR calc Af Amer 77 (*) >90 (mL/min)   HEMOGLOBIN A1C     Status: Abnormal   Collection Time   07/24/11  9:14 AM      Component Value Range Comment  Hemoglobin A1C 10.1 (*) <5.7 (%)    Mean Plasma Glucose 243 (*) <117 (mg/dL)   HEPATIC FUNCTION PANEL     Status: Abnormal   Collection Time   07/24/11  9:14 AM      Component Value Range Comment   Total Protein 7.7  6.0 - 8.3 (g/dL)    Albumin 3.5  3.5 - 5.2 (g/dL)    AST 33  0 - 37 (U/L)    ALT 31  0 - 35 (U/L)    Alkaline Phosphatase 242 (*) 39 - 117 (U/L)    Total Bilirubin 0.4  0.3 - 1.2 (mg/dL)    Bilirubin, Direct <5.3  0.0 - 0.3 (mg/dL) REPEATED TO VERIFY   Indirect Bilirubin NOT CALCULATED  0.3 - 0.9 (mg/dL)   CARDIAC PANEL(CRET KIN+CKTOT+MB+TROPI)     Status: Abnormal   Collection Time   07/24/11  9:15 AM      Component Value Range Comment   Total CK 210 (*) 7 - 177 (U/L)    CK, MB 2.3  0.3 - 4.0 (ng/mL)    Troponin I <0.30  <0.30 (ng/mL)    Relative Index 1.1  0.0 - 2.5    HEPARIN LEVEL     Status: Abnormal   Collection Time    07/24/11  9:18 AM      Component Value Range Comment   Heparin Unfractionated 0.18 (*) 0.30 - 0.70 (IU/mL)   GLUCOSE, CAPILLARY     Status: Abnormal   Collection Time   07/24/11 11:51 AM      Component Value Range Comment   Glucose-Capillary 182 (*) 70 - 99 (mg/dL)   HEPARIN LEVEL     Status: Normal   Collection Time   07/24/11  4:48 PM      Component Value Range Comment   Heparin Unfractionated 0.39  0.30 - 0.70 (IU/mL)   CARDIAC PANEL(CRET KIN+CKTOT+MB+TROPI)     Status: Abnormal   Collection Time   07/24/11  4:52 PM      Component Value Range Comment   Total CK 221 (*) 7 - 177 (U/L)    CK, MB 2.5  0.3 - 4.0 (ng/mL)    Troponin I <0.30  <0.30 (ng/mL)    Relative Index 1.1  0.0 - 2.5    GLUCOSE, CAPILLARY     Status: Abnormal   Collection Time   07/24/11  5:13 PM      Component Value Range Comment   Glucose-Capillary 162 (*) 70 - 99 (mg/dL)   CARDIAC PANEL(CRET KIN+CKTOT+MB+TROPI)     Status: Abnormal   Collection Time   07/25/11 12:51 AM      Component Value Range Comment   Total CK 181 (*) 7 - 177 (U/L)    CK, MB 2.5  0.3 - 4.0 (ng/mL)    Troponin I <0.30  <0.30 (ng/mL)    Relative Index 1.4  0.0 - 2.5    HEPARIN LEVEL     Status: Normal   Collection Time   07/25/11  5:20 AM      Component Value Range Comment   Heparin Unfractionated 0.57  0.30 - 0.70 (IU/mL)   CBC     Status: Abnormal   Collection Time   07/25/11  5:20 AM      Component Value Range Comment   WBC 10.9 (*) 4.0 - 10.5 (K/uL)    RBC 4.36  3.87 - 5.11 (MIL/uL)    Hemoglobin 12.5  12.0 - 15.0 (g/dL)    HCT 38.8  36.0 - 46.0 (%)    MCV 89.0  78.0 - 100.0 (fL)    MCH 28.7  26.0 - 34.0 (pg)    MCHC 32.2  30.0 - 36.0 (g/dL)    RDW 40.9  81.1 - 91.4 (%)    Platelets 304  150 - 400 (K/uL)   GLUCOSE, CAPILLARY     Status: Abnormal   Collection Time   07/25/11  7:48 AM      Component Value Range Comment   Glucose-Capillary 167 (*) 70 - 99 (mg/dL)   GLUCOSE, CAPILLARY     Status: Abnormal   Collection Time    07/25/11 11:43 AM      Component Value Range Comment   Glucose-Capillary 160 (*) 70 - 99 (mg/dL)   GLUCOSE, CAPILLARY     Status: Abnormal   Collection Time   07/25/11  4:35 PM      Component Value Range Comment   Glucose-Capillary 144 (*) 70 - 99 (mg/dL)   GLUCOSE, CAPILLARY     Status: Abnormal   Collection Time   07/25/11 10:12 PM      Component Value Range Comment   Glucose-Capillary 141 (*) 70 - 99 (mg/dL)    Comment 1 Documented in Chart      Comment 2 Notify RN     GLUCOSE, CAPILLARY     Status: Normal   Collection Time   07/26/11 12:00 AM      Component Value Range Comment   Glucose-Capillary 95  70 - 99 (mg/dL)    Comment 1 Documented in Chart      Comment 2 Notify RN     CBC     Status: Abnormal   Collection Time   07/26/11  5:30 AM      Component Value Range Comment   WBC 9.7  4.0 - 10.5 (K/uL)    RBC 3.98  3.87 - 5.11 (MIL/uL)    Hemoglobin 11.2 (*) 12.0 - 15.0 (g/dL)    HCT 78.2 (*) 95.6 - 46.0 (%)    MCV 89.4  78.0 - 100.0 (fL)    MCH 28.1  26.0 - 34.0 (pg)    MCHC 31.5  30.0 - 36.0 (g/dL)    RDW 21.3  08.6 - 57.8 (%)    Platelets 254  150 - 400 (K/uL)   GLUCOSE, CAPILLARY     Status: Abnormal   Collection Time   07/26/11  7:40 AM      Component Value Range Comment   Glucose-Capillary 148 (*) 70 - 99 (mg/dL)    Comment 1 Notify RN      Recent Results (from the past 240 hour(s))  MRSA PCR SCREENING     Status: Normal   Collection Time   07/24/11  3:28 AM      Component Value Range Status Comment   MRSA by PCR NEGATIVE  NEGATIVE  Final      Hospital Course:  1.Chest pain: The patient was admitted to the hospitalist service, she was initially admitted to the step down unit and cardiology was consulted. She was placed on IV heparin and IV nitroglycerin. Upon further review, it is felt that her chest pain was very atypical and perhaps related to her GI symptoms as well. Patient's chest pain was also reproducible. Patient was evaluated by Dr. Elease Hashimoto yesterday, and  he felt the patient did not require any further inpatient investigations and could be discharged home to followup with her primary care practitioner and her primary cardiologist. During my  rounds today, patient feels significantly better and is anxious to go home. She claims that her chest pain has gotten significantly better over the past few days. On examination, her chest pain today is somewhat still reproducible, but per patient this is significantly better than what she came in with. Her cardiac enzymes were also negative.  2. Diabetes- This was stable during his stay here, apparently this is a recent diagnosis for her and she is maintained on metformin. Her HbA1c is 10.1, at this time we'll defer further optimization of her diabetic regimen to her primary care practitioner as well.  3. Hypertension- This is stable she is to continue her usual medications.  4. Dyslipidemia- She is to continue her statin as previous.    Discharge Exam: Blood pressure 108/44, pulse 64, temperature 97.7 F (36.5 C), temperature source Oral, resp. rate 22, height 5\' 5"  (1.651 m), weight 94.6 kg (208 lb 8.9 oz), SpO2 99.00%.  #1 Gen. exam-patient is awake alert speech is clear. She does not appear to be in any distress. #2 HEENT-atraumatic normocephalic, pupils are equally reactive to light and accommodation. #3 neck-no JVD. #4 chest-bilaterally clear to auscultation. No added sounds. #5 cardiovascular-heart sounds are regular no murmurs heard. There is still mild reproducible tenderness over the precordial area. #6 abdomen-bowel sounds are present, abdomen soft nontender and nondistended. #7 extremities-lower extremities are free of edema. #8 neurology-she has a nonfocal exam.   Disposition: Patient is stable to be discharged home.  Discharge Orders    Future Orders Please Complete By Expires   Diet - low sodium heart healthy      Increase activity slowly      Call MD for:  severe uncontrolled pain       Call MD for:  persistant nausea and vomiting         Follow-up Information    Make an appointment with HAQUE,IMRAN P. (please call to make appointment-make appt in 5-7 days)    Contact information:   [address] [place] [address] [phone]       Follow up with asif wahid on 08/03/2011. (pt has a previously scheduled appt on 08/02/12)    Contact information:   [address] [place] [address]         Signed: Jeoffrey Massed 07/26/2011, 8:36 AM

## 2011-07-30 ENCOUNTER — Other Ambulatory Visit: Payer: Self-pay

## 2011-07-30 ENCOUNTER — Encounter (HOSPITAL_COMMUNITY): Payer: Self-pay | Admitting: Emergency Medicine

## 2011-07-30 ENCOUNTER — Observation Stay (HOSPITAL_COMMUNITY)
Admission: EM | Admit: 2011-07-30 | Discharge: 2011-08-01 | Disposition: A | Discharge status: Home / Self Care | Payer: Medicare Other | Attending: Internal Medicine | Admitting: Internal Medicine

## 2011-07-30 ENCOUNTER — Emergency Department (HOSPITAL_COMMUNITY): Payer: Medicare Other

## 2011-07-30 DIAGNOSIS — M940 Chondrocostal junction syndrome [Tietze]: Secondary | ICD-10-CM | POA: Insufficient documentation

## 2011-07-30 DIAGNOSIS — E119 Type 2 diabetes mellitus without complications: Secondary | ICD-10-CM | POA: Diagnosis present

## 2011-07-30 DIAGNOSIS — R112 Nausea with vomiting, unspecified: Secondary | ICD-10-CM | POA: Diagnosis present

## 2011-07-30 DIAGNOSIS — I495 Sick sinus syndrome: Secondary | ICD-10-CM | POA: Insufficient documentation

## 2011-07-30 DIAGNOSIS — R0789 Other chest pain: Principal | ICD-10-CM | POA: Insufficient documentation

## 2011-07-30 DIAGNOSIS — E782 Mixed hyperlipidemia: Secondary | ICD-10-CM | POA: Diagnosis present

## 2011-07-30 DIAGNOSIS — R079 Chest pain, unspecified: Secondary | ICD-10-CM | POA: Diagnosis present

## 2011-07-30 DIAGNOSIS — E7800 Pure hypercholesterolemia, unspecified: Secondary | ICD-10-CM | POA: Insufficient documentation

## 2011-07-30 DIAGNOSIS — K2970 Gastritis, unspecified, without bleeding: Secondary | ICD-10-CM | POA: Insufficient documentation

## 2011-07-30 DIAGNOSIS — Z950 Presence of cardiac pacemaker: Secondary | ICD-10-CM | POA: Insufficient documentation

## 2011-07-30 DIAGNOSIS — K2990 Gastroduodenitis, unspecified, without bleeding: Secondary | ICD-10-CM | POA: Insufficient documentation

## 2011-07-30 DIAGNOSIS — I10 Essential (primary) hypertension: Secondary | ICD-10-CM | POA: Insufficient documentation

## 2011-07-30 DIAGNOSIS — R0602 Shortness of breath: Secondary | ICD-10-CM | POA: Insufficient documentation

## 2011-07-30 HISTORY — DX: Cardiac arrhythmia, unspecified: I49.9

## 2011-07-30 LAB — DIFFERENTIAL
Basophils Relative: 0 % (ref 0–1)
Eosinophils Absolute: 0.2 10*3/uL (ref 0.0–0.7)
Eosinophils Relative: 1 % (ref 0–5)
Lymphs Abs: 3.7 10*3/uL (ref 0.7–4.0)
Monocytes Relative: 6 % (ref 3–12)

## 2011-07-30 LAB — CARDIAC PANEL(CRET KIN+CKTOT+MB+TROPI)
CK, MB: 2.2 ng/mL (ref 0.3–4.0)
Troponin I: <0.3 ng/mL (ref <0.30)

## 2011-07-30 LAB — BASIC METABOLIC PANEL
BUN: 18 mg/dL (ref 6–23)
Calcium: 9.3 mg/dL (ref 8.4–10.5)
GFR calc Af Amer: 80 mL/min — ABNORMAL LOW (ref >90)
GFR calc non Af Amer: 69 mL/min — ABNORMAL LOW (ref >90)
Glucose, Bld: 133 mg/dL — ABNORMAL HIGH (ref 70–99)

## 2011-07-30 LAB — CBC
MCH: 29 pg (ref 26.0–34.0)
MCHC: 34.1 g/dL (ref 30.0–36.0)
MCV: 85 fL (ref 78.0–100.0)
Platelets: 273 10*3/uL (ref 150–400)
RBC: 4.28 MIL/uL (ref 3.87–5.11)

## 2011-07-30 MED ORDER — MORPHINE SULFATE 4 MG/ML IJ SOLN
4.0000 mg | Freq: Once | INTRAMUSCULAR | Status: AC
  Administered 2011-07-31: 4 mg via INTRAVENOUS
  Filled 2011-07-30: qty 1

## 2011-07-30 MED ORDER — ASPIRIN 81 MG PO CHEW
324.0000 mg | CHEWABLE_TABLET | Freq: Once | ORAL | Status: AC
  Administered 2011-07-30: 324 mg via ORAL
  Filled 2011-07-30: qty 4

## 2011-07-30 MED ORDER — ONDANSETRON HCL 4 MG/2ML IJ SOLN
4.0000 mg | Freq: Once | INTRAMUSCULAR | Status: AC
  Administered 2011-07-31: 4 mg via INTRAVENOUS
  Filled 2011-07-30: qty 2

## 2011-07-30 MED ORDER — SODIUM CHLORIDE 0.9 % IV BOLUS (SEPSIS)
1000.0000 mL | Freq: Once | INTRAVENOUS | Status: AC
  Administered 2011-07-31: 1000 mL via INTRAVENOUS

## 2011-07-30 NOTE — ED Notes (Signed)
Pt states she took 4 SL nitro today and her pain did not change

## 2011-07-30 NOTE — ED Notes (Signed)
Iv attempt unsuccessful times 1

## 2011-07-30 NOTE — ED Notes (Signed)
Pt reports discharged from here Wednesday, now with weakness, shortness of breath and decrease appetite.

## 2011-07-30 NOTE — ED Provider Notes (Signed)
CSN: [id number] Arrival date & time: 07/30/2011  5:46 PM   First MD Initiated Contact with Patient 07/30/11 2136      Chief Complaint  Patient presents with  . Chest Pain    (Consider location/radiation/quality/duration/timing/severity/associated sxs/priorHistory treatment) HPI  Patient was admitted to the hospital from November 4 to the 7th for chest pain rule out with negative cardiac markers with history of hypertension, non-insulin-dependent diabetes, and hypercholesterolemia, who is followed by primary care doctor in [place] and by cardiologist in [place] who had cardiology consult while hospitalized returns to the emergency department today complaining that since leaving on the 7th which was Wednesday she's been having increasing weakness, night sweats, and ongoing chest pressure. Patient also notes mild shortness of breath. Patient states the chest discomfort is noted as a constant pressure in her mid chest that despite taking nitroglycerin x4 today, no improvement with symptoms. Patient states since taking the nitroglycerin she has had gradual onset headache. Patient states that when she goes from sitting to standing to walk she becomes increasingly weak and short of breath. Patient denies known fever or cough. Patient states she took 2 baby aspirins today. Patient states chest discomfort also has intermittent radiation into the left side of her jaw and is associated with a constant nausea. Patient states she's been unable to eat or drink because of "upset stomach." She however denies abdominal pain. Past Medical History  Diagnosis Date  . Hypertension   . High cholesterol   . Pacemaker   . Diabetes mellitus   . Arthritis   . Headache   . PONV (postoperative nausea and vomiting)   . 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 Coronary artery disease   . Anxiety   . Fibromyalgia     Past Surgical History  Procedure Date  . Abdominal hysterectomy   . Fracture surgery     left  ankle  . Joint replacement     right knee  . Insert / replace / remove pacemaker     2008  . Cholecystectomy     2005-2006    Family History  Problem Relation Age of Onset  . Coronary artery disease Mother   . Cancer Father   . Brain cancer Brother     History  Substance Use Topics  . Smoking status: Never Smoker   . Smokeless tobacco: Not on file  . Alcohol Use: No    OB History    Grav Para Term Preterm Abortions TAB SAB Ect Mult Living                  Review of Systems  All other systems reviewed and are negative.    Allergies  Shellfish allergy; Decadron; Erythromycin; Heparin; Iohexol; Keflex; Penicillins; Prednisone; Questran; Robaxin; and Toradol  Home Medications   Current Outpatient Rx  Name Route Sig Dispense Refill  . ACETAMINOPHEN 325 MG PO TABS Oral Take 2 tablets (650 mg total) by mouth every 6 (six) hours as needed (or Fever >/= 101). 30 tablet 0  . ASPIRIN 81 MG PO TABS Oral Take 81 mg by mouth daily.      . ATENOLOL 25 MG PO TABS Oral Take 25 mg by mouth daily.      Marland Kitchen DIAZEPAM 10 MG PO TABS Oral Take 10 mg by mouth 2 (two) times daily as needed. For anxiety    . LISINOPRIL-HYDROCHLOROTHIAZIDE 10-12.5 MG PO TABS Oral Take 1 tablet by mouth daily.      Marland Kitchen  METFORMIN HCL 850 MG PO TABS Oral Take 850 mg by mouth daily with breakfast.     . NITROGLYCERIN 0.4 MG SL SUBL Sublingual Place 1 tablet (0.4 mg total) under the tongue every 5 (five) minutes as needed for chest pain. 10 tablet 0  . ONDANSETRON HCL 4 MG PO TABS Oral Take 1 tablet (4 mg total) by mouth every 6 (six) hours as needed for nausea. 20 tablet 0  . PANTOPRAZOLE SODIUM 40 MG PO TBEC Oral Take 40 mg by mouth daily.      Marland Kitchen SIMVASTATIN 40 MG PO TABS Oral Take 40 mg by mouth at bedtime.        BP 110/90  Pulse 66  Temp(Src) 98.3 F (36.8 C) (Oral)  Resp 11  SpO2 98%  Physical Exam  Nursing note and vitals reviewed. Constitutional: She is oriented to person, place, and time. She  appears well-developed and well-nourished. No distress.  HENT:  Head: Normocephalic and atraumatic.  Eyes: Conjunctivae and EOM are normal. Pupils are equal, round, and reactive to light.  Neck: Normal range of motion. Neck supple. No tracheal deviation present. No thyromegaly present.  Cardiovascular: Normal rate, regular rhythm, normal heart sounds and intact distal pulses.  Exam reveals no gallop and no friction rub.   No murmur heard. Pulmonary/Chest: Effort normal. No respiratory distress. She has no wheezes. She has rales in the right lower field and the left lower field. She exhibits no tenderness.  Abdominal: Bowel sounds are normal. She exhibits no distension and no mass. There is no tenderness. There is no rebound and no guarding.  Musculoskeletal: Normal range of motion. She exhibits edema. She exhibits no tenderness.       Trace lurched remedy edema bilateral extremities  Neurological: She is alert and oriented to person, place, and time.  Skin: Skin is warm. No rash noted. She is diaphoretic. No erythema.  Psychiatric: She has a normal mood and affect.    ED Course  Procedures (including critical care time)  Labs Reviewed  CBC - Abnormal; Notable for the following:    WBC 13.9 (*)    All other components within normal limits  DIFFERENTIAL - Abnormal; Notable for the following:    Neutro Abs 9.2 (*)    All other components within normal limits  BASIC METABOLIC PANEL - Abnormal; Notable for the following:    Potassium 3.4 (*)    Glucose, Bld 133 (*)    GFR calc non Af Amer 69 (*)    GFR calc Af Amer 80 (*)    All other components within normal limits  CARDIAC PANEL(CRET KIN+CKTOT+MB+TROPI)  TSH  T4, FREE  D-DIMER, QUANTITATIVE   Dg Chest 2 View  07/30/2011  *RADIOLOGY REPORT*  Clinical Data: Mid chest discomfort, radiating into the left neck. Weakness.  CHEST - 2 VIEW  Comparison: Chest radiograph performed 07/23/2011  Findings: The lungs are relatively well-aerated.   Minimal bibasilar atelectasis is noted.  There is no evidence of pleural effusion or pneumothorax.  The heart is mildly enlarged.  A pacemaker is noted at the left chest wall, with leads ending at the right atrium and right ventricle.  No acute osseous abnormalities are seen.  Clips are noted within the right upper quadrant, reflecting prior cholecystectomy.  IMPRESSION: Minimal bibasilar atelectasis; lungs otherwise clear.  Mild cardiomegaly noted.  Original Report Authenticated By: Tonia Ghent, M.D.   12:52 AM Dr. Dierdre Highman to follow pending ddimer lab and re-evaluation for disposition. VSS.   Labs  reviewed and d-dimer is positive. Patient is allergic to contrast dye unable to get a CTA of her chest. Patient will require a VQ scan the morning. Case discussed with [facility] on-call for medicine as above plan admission for rule out, will likely require cardiology consultation. On exam, heart regular rate and rhythm, lungs clear to auscultation and equal bilaterally. Morphine given for recurrent chest pain which improves her symptoms. Having recurrent chest pain in the emergency department.   MDM   Medical screening examination/treatment/procedure(s) were performed by non-physician practitioner and as supervising physician I was immediately available for consultation/collaboration.       Lenon [place], [place] 07/31/11 9604  Sunnie Nielsen, MD 07/31/11 [phone]

## 2011-07-30 NOTE — Progress Notes (Signed)
60 year old female with a history of pacemaker and questionable history of congestive heart failure was discharged from the hospital last week after being evaluated for an episode of chest pain. Since discharge she complains of general weakness and dyspnea. Today she had chest pain which initially was intermittent and then became constant at about 1500. She noted extreme dyspnea stating that she drove herself to the store and couldn't even walk to the store from where she parked her car. Her exam is unremarkable. Lungs are clear and heart regular rate and rhythm.   Date: 07/31/2011  Rate: 70  Rhythm: normal sinus rhythm  QRS Axis: normal  Intervals: normal  ST/T Wave abnormalities: ST and T changes are noted in the anterior, anterolateral, and inferior leads. T wave inversions are present in those areas with mild ST depression  Conduction Disutrbances:none  Narrative Interpretation: ST and T-wave changes suggestive of anterior, anterolateral, and inferior wall ischemia. Changes are significantly less prominent than on ECG from 07/24/2011  Old EKG Reviewed: changes noted

## 2011-07-30 NOTE — ED Notes (Signed)
Iv team rn to come see pt

## 2011-07-31 ENCOUNTER — Emergency Department (HOSPITAL_COMMUNITY): Payer: Medicare Other

## 2011-07-31 ENCOUNTER — Other Ambulatory Visit: Payer: Self-pay

## 2011-07-31 ENCOUNTER — Encounter (HOSPITAL_COMMUNITY): Payer: Self-pay | Admitting: Internal Medicine

## 2011-07-31 DIAGNOSIS — R079 Chest pain, unspecified: Secondary | ICD-10-CM

## 2011-07-31 DIAGNOSIS — R0602 Shortness of breath: Secondary | ICD-10-CM

## 2011-07-31 DIAGNOSIS — R112 Nausea with vomiting, unspecified: Secondary | ICD-10-CM | POA: Diagnosis present

## 2011-07-31 LAB — BASIC METABOLIC PANEL
BUN: 17 mg/dL (ref 6–23)
Calcium: 8.7 mg/dL (ref 8.4–10.5)
Chloride: 103 mEq/L (ref 96–112)
Creatinine, Ser: 0.93 mg/dL (ref 0.50–1.10)
GFR calc Af Amer: 76 mL/min — ABNORMAL LOW (ref >90)

## 2011-07-31 LAB — CBC
HCT: 34.7 % — ABNORMAL LOW (ref 36.0–46.0)
MCHC: 33.4 g/dL (ref 30.0–36.0)
Platelets: 294 10*3/uL (ref 150–400)
RDW: 14.3 % (ref 11.5–15.5)
WBC: 10.4 10*3/uL (ref 4.0–10.5)

## 2011-07-31 MED ORDER — MORPHINE SULFATE 2 MG/ML IJ SOLN
1.0000 mg | INTRAMUSCULAR | Status: DC | PRN
  Administered 2011-07-31 – 2011-08-01 (×3): 1 mg via INTRAVENOUS
  Filled 2011-07-31 (×3): qty 1

## 2011-07-31 MED ORDER — ENOXAPARIN SODIUM 100 MG/ML ~~LOC~~ SOLN: 1.0000 mg/kg | Freq: Two times a day (BID) | SUBCUTANEOUS | Status: DC

## 2011-07-31 MED ORDER — ENOXAPARIN SODIUM 30 MG/0.3ML ~~LOC~~ SOLN: 1.0000 mg/kg | Freq: Two times a day (BID) | SUBCUTANEOUS | Status: DC

## 2011-07-31 MED ORDER — ZOLPIDEM TARTRATE 5 MG PO TABS: 5.0000 mg | ORAL_TABLET | Freq: Every evening | ORAL | Status: DC | PRN

## 2011-07-31 MED ORDER — LISINOPRIL-HYDROCHLOROTHIAZIDE 10-12.5 MG PO TABS: 1.0000 | ORAL_TABLET | Freq: Every day | ORAL | Status: DC

## 2011-07-31 MED ORDER — INSULIN ASPART 100 UNIT/ML ~~LOC~~ SOLN: 0.0000 [IU] | Freq: Every day | SUBCUTANEOUS | Status: DC

## 2011-07-31 MED ORDER — ACETAMINOPHEN 325 MG PO TABS: 650.0000 mg | ORAL_TABLET | Freq: Four times a day (QID) | ORAL | Status: DC | PRN

## 2011-07-31 MED ORDER — MORPHINE SULFATE 4 MG/ML IJ SOLN
INTRAMUSCULAR | Status: AC
  Administered 2011-07-31: 4 mg via INTRAVENOUS
  Filled 2011-07-31: qty 1

## 2011-07-31 MED ORDER — PANTOPRAZOLE SODIUM 40 MG PO TBEC
40.0000 mg | DELAYED_RELEASE_TABLET | Freq: Every day | ORAL | Status: DC
  Administered 2011-07-31: 40 mg via ORAL
  Filled 2011-07-31: qty 1

## 2011-07-31 MED ORDER — XENON XE 133 GAS
10.0000 | GAS_FOR_INHALATION | Freq: Once | RESPIRATORY_TRACT | Status: AC | PRN
  Administered 2011-07-31: 10 via RESPIRATORY_TRACT

## 2011-07-31 MED ORDER — ONDANSETRON HCL 4 MG/2ML IJ SOLN
4.0000 mg | Freq: Four times a day (QID) | INTRAMUSCULAR | Status: DC | PRN
  Administered 2011-07-31 – 2011-08-01 (×3): 4 mg via INTRAVENOUS
  Filled 2011-07-31 (×3): qty 2

## 2011-07-31 MED ORDER — ACETAMINOPHEN 650 MG RE SUPP: 650.0000 mg | Freq: Four times a day (QID) | RECTAL | Status: DC | PRN

## 2011-07-31 MED ORDER — ATENOLOL 25 MG PO TABS
25.0000 mg | ORAL_TABLET | Freq: Every day | ORAL | Status: DC
  Administered 2011-07-31 – 2011-08-01 (×2): 25 mg via ORAL
  Filled 2011-07-31 (×2): qty 1

## 2011-07-31 MED ORDER — ENOXAPARIN SODIUM 40 MG/0.4ML ~~LOC~~ SOLN
40.0000 mg | SUBCUTANEOUS | Status: DC
  Administered 2011-08-01: 40 mg via SUBCUTANEOUS
  Filled 2011-07-31: qty 0.4

## 2011-07-31 MED ORDER — METFORMIN HCL 850 MG PO TABS
850.0000 mg | ORAL_TABLET | Freq: Every day | ORAL | Status: DC
  Administered 2011-07-31 – 2011-08-01 (×2): 850 mg via ORAL
  Filled 2011-07-31 (×3): qty 1

## 2011-07-31 MED ORDER — ONDANSETRON HCL 4 MG/2ML IJ SOLN
4.0000 mg | Freq: Once | INTRAMUSCULAR | Status: AC
  Administered 2011-07-31: 4 mg via INTRAVENOUS

## 2011-07-31 MED ORDER — POTASSIUM CHLORIDE CRYS ER 20 MEQ PO TBCR
40.0000 meq | EXTENDED_RELEASE_TABLET | Freq: Two times a day (BID) | ORAL | Status: DC
  Administered 2011-07-31 (×2): 40 meq via ORAL
  Filled 2011-07-31 (×4): qty 2

## 2011-07-31 MED ORDER — TECHNETIUM TO 99M ALBUMIN AGGREGATED
6.0000 | Freq: Once | INTRAVENOUS | Status: AC | PRN
  Administered 2011-07-31: 6 via INTRAVENOUS

## 2011-07-31 MED ORDER — ONDANSETRON HCL 4 MG/2ML IJ SOLN
INTRAMUSCULAR | Status: AC
  Administered 2011-07-31: 4 mg via INTRAVENOUS
  Filled 2011-07-31: qty 2

## 2011-07-31 MED ORDER — DIAZEPAM 5 MG PO TABS
10.0000 mg | ORAL_TABLET | Freq: Two times a day (BID) | ORAL | Status: DC | PRN
  Administered 2011-07-31: 10 mg via ORAL
  Filled 2011-07-31: qty 2

## 2011-07-31 MED ORDER — MORPHINE SULFATE 4 MG/ML IJ SOLN
INTRAMUSCULAR | Status: AC
  Administered 2011-07-31: 2 mg via INTRAVENOUS
  Filled 2011-07-31: qty 1

## 2011-07-31 MED ORDER — OXYCODONE HCL 5 MG PO TABS
5.0000 mg | ORAL_TABLET | ORAL | Status: DC | PRN
  Administered 2011-07-31 – 2011-08-01 (×3): 5 mg via ORAL
  Filled 2011-07-31 (×3): qty 1

## 2011-07-31 MED ORDER — ENOXAPARIN SODIUM 100 MG/ML ~~LOC~~ SOLN
1.0000 mg/kg | Freq: Two times a day (BID) | SUBCUTANEOUS | Status: DC
Start: 2011-07-31 — End: 2011-07-31
  Administered 2011-07-31: 95 mg via SUBCUTANEOUS
  Filled 2011-07-31 (×2): qty 1

## 2011-07-31 MED ORDER — ASPIRIN 81 MG PO TABS
81.0000 mg | ORAL_TABLET | Freq: Every day | ORAL | Status: DC
  Administered 2011-07-31 – 2011-08-01 (×2): 81 mg via ORAL
  Filled 2011-07-31 (×2): qty 1

## 2011-07-31 MED ORDER — SIMVASTATIN 40 MG PO TABS
40.0000 mg | ORAL_TABLET | Freq: Every day | ORAL | Status: DC
  Administered 2011-07-31: 40 mg via ORAL
  Filled 2011-07-31 (×2): qty 1

## 2011-07-31 MED ORDER — ONDANSETRON HCL 4 MG PO TABS
4.0000 mg | ORAL_TABLET | Freq: Four times a day (QID) | ORAL | Status: DC | PRN
  Administered 2011-08-01: 4 mg via ORAL
  Filled 2011-07-31: qty 1

## 2011-07-31 MED ORDER — PANTOPRAZOLE SODIUM 40 MG PO TBEC
40.0000 mg | DELAYED_RELEASE_TABLET | Freq: Two times a day (BID) | ORAL | Status: DC
  Administered 2011-07-31 – 2011-08-01 (×2): 40 mg via ORAL
  Filled 2011-07-31 (×2): qty 1

## 2011-07-31 MED ORDER — MORPHINE SULFATE 4 MG/ML IJ SOLN
4.0000 mg | Freq: Once | INTRAMUSCULAR | Status: AC
  Administered 2011-07-31: 4 mg via INTRAVENOUS

## 2011-07-31 MED ORDER — INSULIN ASPART 100 UNIT/ML ~~LOC~~ SOLN
0.0000 [IU] | Freq: Three times a day (TID) | SUBCUTANEOUS | Status: DC
  Administered 2011-07-31 – 2011-08-01 (×4): 1 [IU] via SUBCUTANEOUS
  Filled 2011-07-31: qty 3

## 2011-07-31 MED ORDER — HYDROCHLOROTHIAZIDE 12.5 MG PO CAPS
12.5000 mg | ORAL_CAPSULE | Freq: Every day | ORAL | Status: DC
  Administered 2011-07-31 – 2011-08-01 (×2): 12.5 mg via ORAL
  Filled 2011-07-31 (×3): qty 1

## 2011-07-31 MED ORDER — NITROGLYCERIN 0.4 MG SL SUBL: 0.4000 mg | SUBLINGUAL_TABLET | SUBLINGUAL | Status: DC | PRN

## 2011-07-31 MED ORDER — TRAMADOL HCL 50 MG PO TABS
50.0000 mg | ORAL_TABLET | Freq: Three times a day (TID) | ORAL | Status: DC | PRN
  Administered 2011-07-31 – 2011-08-01 (×2): 50 mg via ORAL
  Filled 2011-07-31 (×2): qty 1

## 2011-07-31 MED ORDER — LISINOPRIL 10 MG PO TABS
10.0000 mg | ORAL_TABLET | Freq: Every day | ORAL | Status: DC
  Administered 2011-07-31 – 2011-08-01 (×2): 10 mg via ORAL
  Filled 2011-07-31 (×2): qty 1

## 2011-07-31 NOTE — Progress Notes (Signed)
Subjective:  Chest pain on and off in the middle of chest worse with palpation.   Objective: Filed Vitals:   07/31/11 0615 07/31/11 0630 07/31/11 0700 07/31/11 0900  BP: 94/53 101/53  112/56  Pulse: 60 61  63  Temp:      TempSrc:      Resp: 12 11  12   Height:   5\' 5"  (1.651 m) 5\' 5"  (1.651 m)  Weight:   93.6 kg (206 lb 5.6 oz) 93.6 kg (206 lb 5.6 oz)  SpO2: 94% 93%     Weight change:  No intake or output data in the 24 hours ending 07/31/11 1040  General: Alert, awake, oriented x3, in no acute distress.  HEENT: No bruits, no goiter.  Heart: Regular rate and rhythm, without murmurs, rubs, gallops.  Lungs: bilateral air movement no wheezes or ronchy.  Abdomen: Soft, nontender, nondistended, positive bowel sounds.  Neuro: Grossly intact, nonfocal. Extremities: no edema   Lab Results:  [facility] 07/31/11 0353 07/30/11 2217  NA 139 135  K 3.4* 3.4*  CL 103 99  CO2 25 22  GLUCOSE 130* 133*  BUN 17 18  CREATININE 0.93 0.89  CALCIUM 8.7 9.3  MG -- --  PHOS -- --    Basename 07/31/11 0353 07/30/11 2217  WBC 10.4 13.9*  NEUTROABS -- 9.2*  HGB 11.6* 12.4  HCT 34.7* 36.4  MCV 86.1 85.0  PLT 294 273    Basename 07/30/11 2217  CKTOTAL 97  CKMB 2.2  CKMBINDEX --  TROPONINI <0.30   No results found for this basename: POCBNP:3 in the last 72 hours  Basename 07/31/11 0009  DDIMER 1.09*    Micro Results: Recent Results (from the past 240 hour(s))  MRSA PCR SCREENING     Status: Normal   Collection Time   07/24/11  3:28 AM      Component Value Range Status Comment   MRSA by PCR NEGATIVE  NEGATIVE  Final   MRSA PCR SCREENING     Status: Normal   Collection Time   07/31/11  8:31 AM      Component Value Range Status Comment   MRSA by PCR NEGATIVE  NEGATIVE  Final     Studies/Results: Dg Chest 2 View  07/30/2011  *RADIOLOGY REPORT*  Clinical Data: Mid chest discomfort, radiating into the left neck. Weakness.  CHEST - 2 VIEW  Comparison: Chest radiograph  performed 07/23/2011  Findings: The lungs are relatively well-aerated.  Minimal bibasilar atelectasis is noted.  There is no evidence of pleural effusion or pneumothorax.  The heart is mildly enlarged.  A pacemaker is noted at the left chest wall, with leads ending at the right atrium and right ventricle.  No acute osseous abnormalities are seen.  Clips are noted within the right upper quadrant, reflecting prior cholecystectomy.  IMPRESSION: Minimal bibasilar atelectasis; lungs otherwise clear.  Mild cardiomegaly noted.  Original Report Authenticated By: Tonia Ghent, M.D.   Nm Pulmonary Per & Vent  07/31/2011  *RADIOLOGY REPORT*  Clinical Data:  Chest pain.  Shortness of breath.  Weakness. Pacemaker in place.  NUCLEAR MEDICINE VENTILATION - PERFUSION LUNG SCAN  Technique:  Wash-in, equilibrium, and wash-out phase ventilation images were obtained using Xe-133 gas.  Perfusion images were obtained in multiple projections after intravenous injection of Tc- 55m MAA.  Radiopharmaceuticals:  10 mCi Xe-133 gas and 6 mCi Tc-6m MAA.  Comparison:  Chest radiographic examination 07/30/2011.  Findings:  Ventilation study:  A focal defect in the anterior aspect of  the left hemithorax midportion corresponds to a transvenous pacemaker controller device.  No segmental or lobar defects are evident.  Perfusion study: A focal defect in the anterior aspect of the left hemithorax midportion corresponds to a transvenous pacemaker controller device.  No segmental or lobar defects are evident. A linear area of subsegmental decreased perfusion in the lingula corresponds to an area of atelectasis or fibrosis on the chest radiographic examination.  No ventilation perfusion mismatch is evident comparing anterior and posterior images.  IMPRESSION: No segmental or lobar ventilation or perfusion defects were evident.  No ventilation perfusion mismatch is evident comparing anterior and posterior images of perfusion and ventilation studies.  Subsegmental area of decreased perfusion in the lingula corresponds to the area of atelectasis or fibrosis within the lingula.  Examination is low probability for pulmonary embolism.  Original Report Authenticated By: Crawford Givens, M.D.    Medications: I have reviewed the patient's current medications.   Patient Active Hospital Problem List: Diabetes mellitus type 2, controlled (07/23/2011) Metformin, and SSI.     Hypertension, benign (07/23/2011) Lisinopril and HCT.   Hyperlipemia, mixed (07/23/2011) Simvastatin.   Sinus node dysfunction (07/23/2011) SP pacemaker.  Chest pain syndrome (07/23/2011) Probably Muscular skeletal Vs GI origen. VQ scan low probability for PE. I will check lower extremities doppler. Add tramadol for pain. Avoid NSAID to avoid gastritis. Change Lovenox to DVT prophylaxis dose.     Nausea and vomiting in adult (07/31/2011) Continue with Protonix.       LOS: 1 day   Fredric Slabach Pager: [phone] [facility] 07/31/2011, 10:40 AM

## 2011-07-31 NOTE — ED Notes (Signed)
Patient to go to NM first and after that to the floor

## 2011-07-31 NOTE — ED Notes (Signed)
Patient to NM at this time. °

## 2011-07-31 NOTE — Progress Notes (Signed)
*  PRELIMINARY RESULTS*   Bilateral lower extremity venous Dopplers have been completed.  No Deep vein thrombosis noted in bilateral lower extremities.    [name] 07/31/2011, 12:31 PM

## 2011-07-31 NOTE — H&P (Signed)
[name] is an 60 y.o. female.   Chief Complaint:Pt is a 60 yr old female who was discharged from this facility on 07/27/11 after an evaluation for chest pain.  Pt presents here again today complaining of chest pain.  She states that is seemed to be getting better during the week, so besides calling for an appointment with her physician, she did not call because she was having ongoing chest pain.  Pt was ruled out last week and was evaluated by cardiology, but did not have a stress test.  Pt states that today the pressure in her chest feels much worse.  She states that it is worse with exertion, does not get worse with deep respiration or cough, and is associated with SOB and nausea and vomiting.  Pt states that her pain in3/10 to 7/10 in severity.  She denies pain in her legs with ambulation or swelling in her legs.  Past Medical History  Diagnosis Date  . Hypertension   . High cholesterol   . Pacemaker   . Diabetes mellitus   . Arthritis   . Headache   . PONV (postoperative nausea and vomiting)   . 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 Coronary artery disease   . Anxiety   . Fibromyalgia   . Dysrhythmia     Past Surgical History  Procedure Date  . Abdominal hysterectomy   . Fracture surgery     left ankle  . Joint replacement     right knee  . Insert / replace / remove pacemaker     2008  . Cholecystectomy     2005-2006    Family History  Problem Relation Age of Onset  . Coronary artery disease Mother   . Diabetes type II Mother   . Cancer Father   . Brain cancer Brother    Social History:  reports that she has never smoked. She does not have any smokeless tobacco history on file. She reports that she does not drink alcohol or use illicit drugs.  Allergies:  Allergies  Allergen Reactions  . Shellfish Allergy Anaphylaxis  . Decadron (Dexamethasone) Nausea And Vomiting  . Erythromycin Itching  . Heparin     Nosebleed  . Iohexol      Desc: pt states reaction is  anaphalactic shock from IV dye and any kind of shellfish. pt states she almost died the last time she had a reaction.   Marland Kitchen Keflex Nausea And Vomiting  . Penicillins Itching  . Prednisone Nausea And Vomiting  . Questran Nausea And Vomiting  . Robaxin Itching and Nausea And Vomiting  . Toradol Other (See Comments)    unknown    Medications Prior to Admission  Medication Dose Route Frequency Provider Last Rate Last Dose  . acetaminophen (TYLENOL) tablet 650 mg  650 mg Oral Q6H PRN Xayvier Vallez       Or  . acetaminophen (TYLENOL) suppository 650 mg  650 mg Rectal Q6H PRN Janeann Paisley      . acetaminophen (TYLENOL) tablet 650 mg  650 mg Oral Q6H PRN Gena Laski      . aspirin chewable tablet 324 mg  324 mg Oral Once [employer], [employer]   324 mg at 07/30/11 2255  . aspirin tablet 81 mg  81 mg Oral Daily Navarre Diana      . atenolol (TENORMIN) tablet 25 mg  25 mg Oral Daily Faiga Stones      . diazepam (VALIUM) tablet  10 mg  10 mg Oral BID PRN Kassia Demarinis      . enoxaparin (LOVENOX) injection 1 mg/kg  1 mg/kg Subcutaneous Q12H Varian Innes      . lisinopril-hydrochlorothiazide (PRINZIDE,ZESTORETIC) 10-12.5 MG per tablet 1 tablet  1 tablet Oral Daily Zamari Vea      . metFORMIN (GLUCOPHAGE) tablet 850 mg  850 mg Oral Q breakfast Miroslav Gin      . morphine 2 MG/ML injection 1 mg  1 mg Intravenous Q2H PRN Jozlyn Schatz      . morphine 4 MG/ML injection 4 mg  4 mg Intravenous Once [employer], [employer]   4 mg at 07/31/11 0023  . morphine 4 MG/ML injection 4 mg  4 mg Intravenous Once Sunnie Nielsen, MD   4 mg at 07/31/11 0255  . nitroGLYCERIN (NITROSTAT) SL tablet 0.4 mg  0.4 mg Sublingual Q5 min PRN Ruthel Martine      . ondansetron (ZOFRAN) injection 4 mg  4 mg Intravenous Once [employer]   4 mg at 07/31/11 0023  . ondansetron (ZOFRAN) injection 4 mg  4 mg Intravenous Once Sunnie Nielsen, MD   4 mg at 07/31/11 0255  . ondansetron (ZOFRAN) tablet 4 mg  4 mg Oral Q6H PRN Chantrice Hagg       Or  . ondansetron (ZOFRAN)  injection 4 mg  4 mg Intravenous Q6H PRN Evalise Abruzzese      . oxyCODONE (Oxy IR/ROXICODONE) immediate release tablet 5 mg  5 mg Oral Q4H PRN Broghan Pannone      . pantoprazole (PROTONIX) EC tablet 40 mg  40 mg Oral Daily Farouk Vivero      . simvastatin (ZOCOR) tablet 40 mg  40 mg Oral QHS Aarilyn Dye      . sodium chloride 0.9 % bolus 1,000 mL  1,000 mL Intravenous Once [employer]   1,000 mL at 07/31/11 0017  . zolpidem (AMBIEN) tablet 5 mg  5 mg Oral QHS PRN Chrishaun Sasso       Medications Prior to Admission  Medication Sig Dispense Refill  . acetaminophen (TYLENOL) 325 MG tablet Take 2 tablets (650 mg total) by mouth every 6 (six) hours as needed (or Fever >/= 101).  30 tablet  0  . aspirin 81 MG tablet Take 81 mg by mouth daily.        Marland Kitchen atenolol (TENORMIN) 25 MG tablet Take 25 mg by mouth daily.        . diazepam (VALIUM) 10 MG tablet Take 10 mg by mouth 2 (two) times daily as needed. For anxiety      . lisinopril-hydrochlorothiazide (PRINZIDE,ZESTORETIC) 10-12.5 MG per tablet Take 1 tablet by mouth daily.        . metFORMIN (GLUCOPHAGE) 850 MG tablet Take 850 mg by mouth daily with breakfast.       . nitroGLYCERIN (NITROSTAT) 0.4 MG SL tablet Place 1 tablet (0.4 mg total) under the tongue every 5 (five) minutes as needed for chest pain.  10 tablet  0  . ondansetron (ZOFRAN) 4 MG tablet Take 1 tablet (4 mg total) by mouth every 6 (six) hours as needed for nausea.  20 tablet  0  . pantoprazole (PROTONIX) 40 MG tablet Take 40 mg by mouth daily.        . simvastatin (ZOCOR) 40 MG tablet Take 40 mg by mouth at bedtime.          Results for orders placed during the hospital  encounter of 07/30/11 (from the past 48 hour(s))  CARDIAC PANEL(CRET KIN+CKTOT+MB+TROPI)     Status: Normal   Collection Time   07/30/11 10:17 PM      Component Value Range Comment   Total CK 97  7 - 177 (U/L)    CK, MB 2.2  0.3 - 4.0 (ng/mL)    Troponin I <0.30  <0.30 (ng/mL)    Relative Index RELATIVE INDEX IS INVALID   0.0 - 2.5    CBC     Status: Abnormal   Collection Time   07/30/11 10:17 PM      Component Value Range Comment   WBC 13.9 (*) 4.0 - 10.5 (K/uL)    RBC 4.28  3.87 - 5.11 (MIL/uL)    Hemoglobin 12.4  12.0 - 15.0 (g/dL)    HCT 96.0  45.4 - 09.8 (%)    MCV 85.0  78.0 - 100.0 (fL)    MCH 29.0  26.0 - 34.0 (pg)    MCHC 34.1  30.0 - 36.0 (g/dL)    RDW 11.9  14.7 - 82.9 (%)    Platelets 273  150 - 400 (K/uL)   DIFFERENTIAL     Status: Abnormal   Collection Time   07/30/11 10:17 PM      Component Value Range Comment   Neutrophils Relative 66  43 - 77 (%)    Neutro Abs 9.2 (*) 1.7 - 7.7 (K/uL)    Lymphocytes Relative 27  12 - 46 (%)    Lymphs Abs 3.7  0.7 - 4.0 (K/uL)    Monocytes Relative 6  3 - 12 (%)    Monocytes Absolute 0.8  0.1 - 1.0 (K/uL)    Eosinophils Relative 1  0 - 5 (%)    Eosinophils Absolute 0.2  0.0 - 0.7 (K/uL)    Basophils Relative 0  0 - 1 (%)    Basophils Absolute 0.0  0.0 - 0.1 (K/uL)   BASIC METABOLIC PANEL     Status: Abnormal   Collection Time   07/30/11 10:17 PM      Component Value Range Comment   Sodium 135  135 - 145 (mEq/L)    Potassium 3.4 (*) 3.5 - 5.1 (mEq/L)    Chloride 99  96 - 112 (mEq/L)    CO2 22  19 - 32 (mEq/L)    Glucose, Bld 133 (*) 70 - 99 (mg/dL)    BUN 18  6 - 23 (mg/dL)    Creatinine, Ser 5.62  0.50 - 1.10 (mg/dL)    Calcium 9.3  8.4 - 10.5 (mg/dL)    GFR calc non Af Amer 69 (*) >90 (mL/min)    GFR calc Af Amer 80 (*) >90 (mL/min)   D-DIMER, QUANTITATIVE     Status: Abnormal   Collection Time   07/31/11 12:09 AM      Component Value Range Comment   D-Dimer, Quant 1.09 (*) 0.00 - 0.48 (ug/mL-FEU)    Dg Chest 2 View  07/30/2011  *RADIOLOGY REPORT*  Clinical Data: Mid chest discomfort, radiating into the left neck. Weakness.  CHEST - 2 VIEW  Comparison: Chest radiograph performed 07/23/2011  Findings: The lungs are relatively well-aerated.  Minimal bibasilar atelectasis is noted.  There is no evidence of pleural effusion or  pneumothorax.  The heart is mildly enlarged.  A pacemaker is noted at the left chest wall, with leads ending at the right atrium and right ventricle.  No acute osseous abnormalities are seen.  Clips are noted  within the right upper quadrant, reflecting prior cholecystectomy.  IMPRESSION: Minimal bibasilar atelectasis; lungs otherwise clear.  Mild cardiomegaly noted.  Original Report Authenticated By: Tonia Ghent, M.D.    Review of Systems  Constitutional: Positive for malaise/fatigue. Negative for fever, chills, weight loss and diaphoresis.  HENT: Negative.  Negative for hearing loss, ear pain and sore throat.   Eyes: Negative.  Negative for blurred vision, double vision and redness.  Respiratory: Positive for shortness of breath. Negative for cough, hemoptysis, sputum production and wheezing.   Cardiovascular: Positive for chest pain and palpitations. Negative for orthopnea, claudication, leg swelling and PND.  Gastrointestinal: Positive for nausea. Negative for heartburn, vomiting, diarrhea, constipation, blood in stool and melena.  Genitourinary: Negative.   Musculoskeletal: Negative.   Skin: Negative.  Negative for itching and rash.  Neurological: Negative.  Negative for headaches.  Endo/Heme/Allergies: Does not bruise/bleed easily.  Psychiatric/Behavioral: Negative for depression, suicidal ideas, hallucinations, memory loss and substance abuse. The patient is nervous/anxious. The patient does not have insomnia.     Blood pressure 103/38, pulse 64, temperature 98.3 F (36.8 C), temperature source Oral, resp. rate 9, SpO2 95.00%. Physical Exam  Constitutional: She is oriented to person, place, and time. She appears well-developed and well-nourished. She appears distressed.  HENT:  Head: Normocephalic and atraumatic.  Right Ear: External ear normal.  Left Ear: External ear normal.  Nose: Nose normal.  Mouth/Throat: Oropharynx is clear and moist.  Eyes: Conjunctivae and EOM are normal.  Pupils are equal, round, and reactive to light.  Neck: Normal range of motion. Neck supple. No JVD present. No tracheal deviation present. No thyromegaly present.  Cardiovascular: Normal rate, regular rhythm and normal heart sounds.   No murmur heard. Respiratory: No respiratory distress. She has no wheezes. She has no rales. She exhibits no tenderness.  GI: Soft. Bowel sounds are normal. She exhibits no distension and no mass. There is no tenderness. There is no rebound and no guarding.  Musculoskeletal: Normal range of motion. She exhibits no edema.  Lymphadenopathy:    She has no cervical adenopathy.  Neurological: She is alert and oriented to person, place, and time. She has normal reflexes. No cranial nerve deficit. Coordination normal.  Skin: Skin is warm. No rash noted. She is diaphoretic. No erythema. No pallor.  Psychiatric: She has a normal mood and affect. Her behavior is normal. Judgment and thought content normal.     Assessment/Plan  1. Chest Pain  DDx    1) Pt has a positive DDimer  - it is important to rule out a PE.  However, her symptoms and presentation would be atypical for this diagnosis.    2) GI in origin - Likely    3) ACS - doubt pt has negative enzymes and EKG despite ongoing chest pain    for several days.  However pt does have significant risk factors. Pt will be admitted under observation status, ruled out for an MI by EKG and enzyme criteria, have a VQ scan to rule out PE,  and be evaluated by cardiology for a stress test.  She will also be continued on a PPI. 2. Hypertension - Controlled We will continue the patient's home medications.  3. DM Type II - Poorly controlled.  Continue FSBS and sensitive dosing of SSI. 4. Hyperlipidemia Continue pt on statin. 5. Obesity   Sherena Machorro 07/31/2011, 3:54 AM

## 2011-07-31 NOTE — Progress Notes (Signed)
UR review completed. 

## 2011-07-31 NOTE — Consult Note (Signed)
CARDIOLOGY CONSULT NOTE  Patient ID: [name] MRN: [id number], DOB/AGE: 60-20-1952   Admit date: 07/30/2011 Date of Consult: 07/31/2011   Primary Physician: Leata Mouse Primary Cardiologist: Lolita Lenz  Pt. Profile: 60 y/o female w/ h/o c/p and symptomatic brady (s/p ppm in 2008), followed by Dr. Willeen Cass, who presents w/ recurrent c/p (just here last week).  Problem List: Past Medical History  Diagnosis Date  . Hypertension   . High cholesterol   . Pacemaker   . Diabetes mellitus   . Arthritis   . Headache   . PONV (postoperative nausea and vomiting)   . 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 NL CORS ON CATH IN 2008 - PT NOT SURE IF CATH OR EP STUDY   . Anxiety   . Fibromyalgia   . Dysrhythmia     Past Surgical History  Procedure Date  . Abdominal hysterectomy   . Fracture surgery     left ankle  . Joint replacement     right knee  . Insert / replace / remove pacemaker     2008  . Cholecystectomy     2005-2006     Allergies:  Allergies  Allergen Reactions  . Shellfish Allergy Anaphylaxis  . Decadron (Dexamethasone) Nausea And Vomiting  . Erythromycin Itching  . Heparin     Nosebleed  . Iohexol      Desc: pt states reaction is anaphalactic shock from IV dye and any kind of shellfish. pt states she almost died the last time she had a reaction.   Marland Kitchen Keflex Nausea And Vomiting  . Penicillins Itching  . Prednisone Nausea And Vomiting  . Questran Nausea And Vomiting  . Robaxin Itching and Nausea And Vomiting  . Toradol Other (See Comments)    unknown    HPI: 60 y/o female with a several yr h/o intermittent c/p, generally @ rest but may occur with exertion.  "Spells" may occur a few times a month and in the past few months have been occurring more frequently.  A 'spell' is defined as an episode of c/p with cw tenderness, radiating to the left shoulder and jaw, sometimes assoc w/ diaph and dyspnea lasting 24-72+ hrs.  Ibuprofen helps but never fully  relieves the pain.  She's had @ least 3 spells in the past 2 wks.  In addition, she has been having episodes of night sweats assoc with wkns.  She was admitted a wk ago b/c of her constellation of Ss and ruled out and was subseq d/c'd.  Unfortunately, she has cont to have intermittent diaph spells and c/p thus prompting re-visit to the ED yesterday.  Currently she has 7/10 sscp worse with palpation.  It has been present since yesterday morning.   Inpatient Medications:     . aspirin  324 mg Oral Once  . aspirin  81 mg Oral Daily  . atenolol  25 mg Oral Daily  . enoxaparin (LOVENOX) injection  1 mg/kg Subcutaneous Q12H  . hydrochlorothiazide  12.5 mg Oral Daily  . lisinopril  10 mg Oral Daily  . metFORMIN  850 mg Oral Q breakfast  .  morphine injection  4 mg Intravenous Once  .  morphine injection  4 mg Intravenous Once  . ondansetron (ZOFRAN) IV  4 mg Intravenous Once  . ondansetron (ZOFRAN) IV  4 mg Intravenous Once  . pantoprazole  40 mg Oral Daily  . simvastatin  40 mg Oral QHS  . sodium  chloride  1,000 mL Intravenous Once  . DISCONTD: enoxaparin  1 mg/kg Subcutaneous Q12H  . DISCONTD: enoxaparin (LOVENOX) injection  1 mg/kg Subcutaneous Q12H  . DISCONTD: lisinopril-hydrochlorothiazide  1 tablet Oral Daily   FAMILY HISTORY Family  Problem  Relation  Name  Age of Onset  Comments  Source   Coronary artery disease  Mother    s/p stenting, currently 11  Provider   Diabetes type II  Mother     Provider   Cancer  Father    heavy smoker, died of lung cancer in 01-Feb-1979  Provider   Brain cancer  Brother    ? CAD  Provider          Family Status  Relation  Name  Status  Death Age  Comments  Source   Mother   Alive    Provider   Father   Deceased    Provider   Brother   Deceased    Provider     History   Social History  . Marital Status: Married    Spouse Name: N/A    Number of Children: N/A  . Years of Education: N/A   Occupational History  . Not on file.   Social History  Main Topics  . Smoking status: Never Smoker   . Smokeless tobacco: Not on file  . Alcohol Use: No  . Drug Use: No  . Sexually Active: Not Currently    Birth Control/ Protection: None   Other Topics Concern  . Not on file   Social History Narrative  . No narrative on file     Review of Systems: General: + night sweats w wkns. Cardiovascular: + c/p, doe Dermatological: negative for rash Respiratory: negative for cough or wheezing Urologic: negative for hematuria Abdominal:+ for n, v, occas abd pain. Notes brb on toilet paper from time to time and has history of hemorrhoids. Neurologic: negative for visual changes, syncope, or dizziness. + for freq headaches. All other systems reviewed and are otherwise negative except as noted above.  Physical Exam: Blood pressure 101/53, pulse 61, temperature 98.3 F (36.8 C), temperature source Oral, resp. rate 11, height 5\' 5"  (1.651 m), weight 206 lb 5.6 oz (93.6 kg), SpO2 93.00%.  General: Well developed, well nourished, in no acute distress. Head: Normocephalic, atraumatic, sclera non-icteric, no xanthomas, nares are without discharge.  Neck: Negative for carotid bruits. JVD not elevated. Lungs: bibasilar rales, otw cta.. Breathing is unlabored. Heart: RRR with S1 S2. No murmurs, rubs, or gallops appreciated.  Chest wall is diffusely tender. Abdomen: Soft, non-tender, non-distended with normoactive bowel sounds. No hepatomegaly. No rebound/guarding. No obvious abdominal masses. Msk:  Strength and tone appears normal for age. Extremities: No clubbing, cyanosis or edema.  Distal pedal pulses are 2+ and equal bilaterally.  Lower legs are tender. Neuro: Alert and oriented X 3. Moves all extremities spontaneously. Psych:  Responds to questions appropriately with a normal affect.  Labs:   Results for orders placed during the hospital encounter of 07/30/11 (from the past 72 hour(s))  CARDIAC PANEL(CRET KIN+CKTOT+MB+TROPI)     Status: Normal    Collection Time   07/30/11 10:17 PM      Component Value Range Comment   Total CK 97  7 - 177 (U/L)    CK, MB 2.2  0.3 - 4.0 (ng/mL)    Troponin I <0.30  <0.30 (ng/mL)    Relative Index RELATIVE INDEX IS INVALID  0.0 - 2.5    CBC  Status: Abnormal   Collection Time   07/30/11 10:17 PM      Component Value Range Comment   WBC 13.9 (*) 4.0 - 10.5 (K/uL)    RBC 4.28  3.87 - 5.11 (MIL/uL)    Hemoglobin 12.4  12.0 - 15.0 (g/dL)    HCT 82.9  56.2 - 13.0 (%)    MCV 85.0  78.0 - 100.0 (fL)    MCH 29.0  26.0 - 34.0 (pg)    MCHC 34.1  30.0 - 36.0 (g/dL)    RDW 86.5  78.4 - 69.6 (%)    Platelets 273  150 - 400 (K/uL)   DIFFERENTIAL     Status: Abnormal   Collection Time   07/30/11 10:17 PM      Component Value Range Comment   Neutrophils Relative 66  43 - 77 (%)    Neutro Abs 9.2 (*) 1.7 - 7.7 (K/uL)    Lymphocytes Relative 27  12 - 46 (%)    Lymphs Abs 3.7  0.7 - 4.0 (K/uL)    Monocytes Relative 6  3 - 12 (%)    Monocytes Absolute 0.8  0.1 - 1.0 (K/uL)    Eosinophils Relative 1  0 - 5 (%)    Eosinophils Absolute 0.2  0.0 - 0.7 (K/uL)    Basophils Relative 0  0 - 1 (%)    Basophils Absolute 0.0  0.0 - 0.1 (K/uL)   BASIC METABOLIC PANEL     Status: Abnormal   Collection Time   07/30/11 10:17 PM      Component Value Range Comment   Sodium 135  135 - 145 (mEq/L)    Potassium 3.4 (*) 3.5 - 5.1 (mEq/L)    Chloride 99  96 - 112 (mEq/L)    CO2 22  19 - 32 (mEq/L)    Glucose, Bld 133 (*) 70 - 99 (mg/dL)    BUN 18  6 - 23 (mg/dL)    Creatinine, Ser 2.95  0.50 - 1.10 (mg/dL)    Calcium 9.3  8.4 - 10.5 (mg/dL)    GFR calc non Af Amer 69 (*) >90 (mL/min)    GFR calc Af Amer 80 (*) >90 (mL/min)   D-DIMER, QUANTITATIVE     Status: Abnormal   Collection Time   07/31/11 12:09 AM      Component Value Range Comment   D-Dimer, Quant 1.09 (*) 0.00 - 0.48 (ug/mL-FEU)   BASIC METABOLIC PANEL     Status: Abnormal   Collection Time   07/31/11  3:53 AM      Component Value Range Comment    Sodium 139  135 - 145 (mEq/L)    Potassium 3.4 (*) 3.5 - 5.1 (mEq/L)    Chloride 103  96 - 112 (mEq/L)    CO2 25  19 - 32 (mEq/L)    Glucose, Bld 130 (*) 70 - 99 (mg/dL)    BUN 17  6 - 23 (mg/dL)    Creatinine, Ser 2.84  0.50 - 1.10 (mg/dL)    Calcium 8.7  8.4 - 10.5 (mg/dL)    GFR calc non Af Amer 65 (*) >90 (mL/min)    GFR calc Af Amer 76 (*) >90 (mL/min)   CBC     Status: Abnormal   Collection Time   07/31/11  3:53 AM      Component Value Range Comment   WBC 10.4  4.0 - 10.5 (K/uL)    RBC 4.03  3.87 - 5.11 (MIL/uL)  Hemoglobin 11.6 (*) 12.0 - 15.0 (g/dL)    HCT 91.4 (*) 78.2 - 46.0 (%)    MCV 86.1  78.0 - 100.0 (fL)    MCH 28.8  26.0 - 34.0 (pg)    MCHC 33.4  30.0 - 36.0 (g/dL)    RDW 95.6  21.3 - 08.6 (%)    Platelets 294  150 - 400 (K/uL)   GLUCOSE, CAPILLARY     Status: Abnormal   Collection Time   07/31/11  9:10 AM      Component Value Range Comment   Glucose-Capillary 131 (*) 70 - 99 (mg/dL)     Radiology/Studies: Dg Chest 2 View  07/30/2011  *RADIOLOGY REPORT*  Clinical Data: Mid chest discomfort, radiating into the left neck. Weakness.  CHEST - 2 VIEW  Comparison: Chest radiograph performed 07/23/2011  Findings: The lungs are relatively well-aerated.  Minimal bibasilar atelectasis is noted.  There is no evidence of pleural effusion or pneumothorax.  The heart is mildly enlarged.  A pacemaker is noted at the left chest wall, with leads ending at the right atrium and right ventricle.  No acute osseous abnormalities are seen.  Clips are noted within the right upper quadrant, reflecting prior cholecystectomy.  IMPRESSION: Minimal bibasilar atelectasis; lungs otherwise clear.  Mild cardiomegaly noted.  Original Report Authenticated By: Tonia Ghent, M.D.   Nm Pulmonary Per & Vent 07/31/2011  *RADIOLOGY REPORT*  Clinical Data:  Chest pain.  Shortness of breath.  Weakness. Pacemaker in place.  NUCLEAR MEDICINE VENTILATION - PERFUSION LUNG SCAN  Technique:  Wash-in,  equilibrium, and wash-out phase ventilation images were obtained using Xe-133 gas.  Perfusion images were obtained in multiple projections after intravenous injection of Tc- 40m MAA.  Radiopharmaceuticals:  10 mCi Xe-133 gas and 6 mCi Tc-65m MAA.  Comparison:  Chest radiographic examination 07/30/2011.  Findings:  Ventilation study:  A focal defect in the anterior aspect of the left hemithorax midportion corresponds to a transvenous pacemaker controller device.  No segmental or lobar defects are evident.  Perfusion study: A focal defect in the anterior aspect of the left hemithorax midportion corresponds to a transvenous pacemaker controller device.  No segmental or lobar defects are evident. A linear area of subsegmental decreased perfusion in the lingula corresponds to an area of atelectasis or fibrosis on the chest radiographic examination.  No ventilation perfusion mismatch is evident comparing anterior and posterior images.  IMPRESSION: No segmental or lobar ventilation or perfusion defects were evident.  No ventilation perfusion mismatch is evident comparing anterior and posterior images of perfusion and ventilation studies. Subsegmental area of decreased perfusion in the lingula corresponds to the area of atelectasis or fibrosis within the lingula.  Examination is low probability for pulmonary embolism.  Original Report Authenticated By: Crawford Givens, M.D.    EKG: RSR, inf, antlat twi.  Unchanged from ecg earlier this month.  ASSESSMENT AND PLAN:  1.  Chest Pain:  Pt with a long h/o intermittent c/p that comes on with both rest and exertion, lasting days at a time and resolving spont.  Ss have been more freq recently but are easily reproducible with chest wall palpation.  Despite prolonged Ss, her ecg is unchanged (though not NL), and CE are currently negative.  Agree w/ ruling out.  If CE negative, would have her f/u w/ her primary cardiologist in [employer] (Dr. Willeen Cass), whom she has an appt with on  this Wednesday and let him decide if he'd like to pursue any further ischemic eval, though  doubt that this is ischemic in origin.  Cont current meds.  2.  HTN:  Stable.  3.  HL:  On statin.  F/u as outpt.  4.  DM:  Per IM.  5.  Fibromyalgia:  ? Contribution to Ss.   Signed, Nicolasa Ducking, NP 07/31/2011, 9:22 AM   Patient seen with NP, agree with note.  Patient has an exquisitely tender chest wall, ? Costochondritis.  Exam otherwise unremarkable.  Would consider trial of NSAIDs for pain.  She has had prolonged chest pain (> 24 hours) with negative enzymes.  If another set of enzymes remains negative, would let her followup with her regular cardiologist.  I suspect that this pain is noncardiac.  If she is to get a stress test, would probably be best to have it with the cardiologist who will be following her.  He is in [place] and she has an appointment with him later this week.   [name] [employer]

## 2011-08-01 LAB — BASIC METABOLIC PANEL
CO2: 26 mEq/L (ref 19–32)
Calcium: 9.1 mg/dL (ref 8.4–10.5)
Creatinine, Ser: 0.89 mg/dL (ref 0.50–1.10)
Glucose, Bld: 122 mg/dL — ABNORMAL HIGH (ref 70–99)

## 2011-08-01 LAB — CARDIAC PANEL(CRET KIN+CKTOT+MB+TROPI)
Relative Index: INVALID (ref 0.0–2.5)
Troponin I: <0.3 ng/mL (ref <0.30)

## 2011-08-01 MED ORDER — OXYCODONE HCL 5 MG PO TABS: 5.0000 mg | ORAL_TABLET | Freq: Four times a day (QID) | ORAL | Status: AC | PRN

## 2011-08-01 MED ORDER — PANTOPRAZOLE SODIUM 40 MG PO TBEC: 40.0000 mg | DELAYED_RELEASE_TABLET | Freq: Two times a day (BID) | ORAL | Status: DC

## 2011-08-01 NOTE — Progress Notes (Signed)
Pt discharged home with husband at 1440.  RN reviewed discharged instructions and medications.  Pt verbalized understanding.

## 2011-08-01 NOTE — Progress Notes (Signed)
Patient Name: [name] Date of Encounter: @TODAY @     Active Problems:  Diabetes mellitus type 2, controlled  Hypertension, benign  Hyperlipemia, mixed  Sinus node dysfunction  Chest pain syndrome  Nausea and vomiting in adult  Morbid obesity, BMI not known    SUBJECTIVE: Pt. Slept well. States cp has improved to approximately 5/10. Endorses unchanged nausea and intermittent weakness and diaphoresis. Denies SOB, vomiting, edema, palpitations and abdominal pain.    OBJECTIVE  Filed Vitals:   08/01/11 0500 08/01/11 0600 08/01/11 0700 08/01/11 0730  BP:    109/54  Pulse: 62 59 64 67  Temp:    98.4 F (36.9 C)  TempSrc:    Oral  Resp:      Height:      Weight:      SpO2: 94% 95% 92% 92%    Intake/Output Summary (Last 24 hours) at 08/01/11 0817 Last data filed at 07/31/11 2200  Gross per 24 hour  Intake    583 ml  Output    401 ml  Net    182 ml   Weight change: 0 kg (0 lb)  PHYSICAL EXAM  General: Well developed, well nourished, in no acute distress. Head: Normocephalic, atraumatic, sclera non-icteric Neck: Supple without bruits or JVD. Lungs:  Resp regular and unlabored, CTA. Heart: RRR no s3, s4, or murmurs. Abdomen: Soft, non-tender, non-distended, BS + x 4.  Msk:  Anterior chest wall/ sternum tender to palpation. Strength and tone appears normal for age. Extremities: No clubbing, cyanosis or edema. DP/PT/Radials 2+ and equal bilaterally. Neuro: Alert and oriented X 3. Moves all extremities spontaneously. Psych: Normal affect.  LABS:  CBC:  Basename 07/31/11 0353 07/30/11 2217  WBC 10.4 13.9*  NEUTROABS -- 9.2*  HGB 11.6* 12.4  HCT 34.7* 36.4  MCV 86.1 85.0  PLT 294 273   Basic Metabolic Panel:  Basename 08/01/11 0650 07/31/11 0353  NA 138 139  K 4.4 3.4*  CL 104 103  CO2 26 25  GLUCOSE 122* 130*  BUN 16 17  CREATININE 0.89 0.93  CALCIUM 9.1 8.7  MG -- --  PHOS -- --   Liver Function Tests: No results found for this basename:  AST:2,ALT:2,ALKPHOS:2,BILITOT:2,PROT:2,ALBUMIN:2 in the last 72 hours No results found for this basename: LIPASE:2,AMYLASE:2 in the last 72 hours Cardiac Enzymes:  Basename 07/30/11 2217  CKTOTAL 97  CKMB 2.2  CKMBINDEX --  TROPONINI <0.30   BNP: No results found for this basename: POCBNP:3 in the last 72 hours D-Dimer:  [facility] 07/31/11 0009  DDIMER 1.09*    Basename 07/30/11 2217  TSH 3.087  T4TOTAL --  T3FREE --  THYROIDAB --   TELE: no acute events, a. Paced rate 60-70 bpm  ECG: a. Paced at 72 bpm, nonacute changes, diffuse TWIs unchanged  Lower Extremity Doppler: 11/12, preliminary, per note, no DVT noted in bilateral lower extremities  Radiology/Studies:  Dg Chest 2 View  07/30/2011  *RADIOLOGY REPORT*  Clinical Data: Mid chest discomfort, radiating into the left neck. Weakness.  CHEST - 2 VIEW  Comparison: Chest radiograph performed 07/23/2011  Findings: The lungs are relatively well-aerated.  Minimal bibasilar atelectasis is noted.  There is no evidence of pleural effusion or pneumothorax.  The heart is mildly enlarged.  A pacemaker is noted at the left chest wall, with leads ending at the right atrium and right ventricle.  No acute osseous abnormalities are seen.  Clips are noted within the right upper quadrant, reflecting prior cholecystectomy.  IMPRESSION:  Minimal bibasilar atelectasis; lungs otherwise clear.  Mild cardiomegaly noted.  Original Report Authenticated By: Tonia Ghent, M.D.   Nm Pulmonary Per & Vent  07/31/2011  *RADIOLOGY REPORT*  Clinical Data:  Chest pain.  Shortness of breath.  Weakness. Pacemaker in place.  NUCLEAR MEDICINE VENTILATION - PERFUSION LUNG SCAN  Technique:  Wash-in, equilibrium, and wash-out phase ventilation images were obtained using Xe-133 gas.  Perfusion images were obtained in multiple projections after intravenous injection of Tc- 62m MAA.  Radiopharmaceuticals:  10 mCi Xe-133 gas and 6 mCi Tc-35m MAA.  Comparison:  Chest  radiographic examination 07/30/2011.  Findings:  Ventilation study:  A focal defect in the anterior aspect of the left hemithorax midportion corresponds to a transvenous pacemaker controller device.  No segmental or lobar defects are evident.  Perfusion study: A focal defect in the anterior aspect of the left hemithorax midportion corresponds to a transvenous pacemaker controller device.  No segmental or lobar defects are evident. A linear area of subsegmental decreased perfusion in the lingula corresponds to an area of atelectasis or fibrosis on the chest radiographic examination.  No ventilation perfusion mismatch is evident comparing anterior and posterior images.  IMPRESSION: No segmental or lobar ventilation or perfusion defects were evident.  No ventilation perfusion mismatch is evident comparing anterior and posterior images of perfusion and ventilation studies. Subsegmental area of decreased perfusion in the lingula corresponds to the area of atelectasis or fibrosis within the lingula.  Examination is low probability for pulmonary embolism.  Original Report Authenticated By: Crawford Givens, M.D.      ASSESSMENT AND PLAN:  1. Chest pain- pt reports modest improvement in chest pain, still tender to palpation of chest wall. ECG this morning unchanged from admission. VQ scan low prob and no DVT in LEs per sonographer note. Will continue to cycle CEs. Can be d/ced and resume already scheduled follow-up appointment with cardiologist if enzymes neg. Will defer to her cardiologist in terms of outpatient stress test recs. Chest pain likely m/s in origin ? costochondritis, d/c with tramadol as patient noted modest improvement with this.  2. HTN: well-controlled.  3. HL: continue outpatient statin and follow-up as scheduled  4. DM: per IM  Signed, Orine Goga , PA-C

## 2011-08-01 NOTE — Progress Notes (Signed)
SUBJECTIVE:  Still with chest pain to palpation.  No new SOB   PHYSICAL EXAM Filed Vitals:   08/01/11 0600 08/01/11 0700 08/01/11 0730 08/01/11 0800  BP:   109/54 109/54  Pulse: 59 64 67 66  Temp:   98.4 F (36.9 C)   TempSrc:   Oral   Resp:      Height:      Weight:      SpO2: 95% 92% 92% 93%   General:  No distress Lungs:  Clear Heart:  RRR, no murmurs Abdomen:  Positive bowel sounds, no rebound no guarding Extremities:  No edema  LABS: Lab Results  Component Value Date   CKTOTAL 97 07/30/2011   CKMB 2.2 07/30/2011   TROPONINI <0.30 07/30/2011   Results for orders placed during the hospital encounter of 07/30/11 (from the past 24 hour(s))  GLUCOSE, CAPILLARY     Status: Abnormal   Collection Time   07/31/11 11:51 AM      Component Value Range   Glucose-Capillary 131 (*) 70 - 99 (mg/dL)  GLUCOSE, CAPILLARY     Status: Abnormal   Collection Time   07/31/11  4:41 PM      Component Value Range   Glucose-Capillary 136 (*) 70 - 99 (mg/dL)  GLUCOSE, CAPILLARY     Status: Abnormal   Collection Time   07/31/11  8:20 PM      Component Value Range   Glucose-Capillary 160 (*) 70 - 99 (mg/dL)  BASIC METABOLIC PANEL     Status: Abnormal   Collection Time   08/01/11  6:50 AM      Component Value Range   Sodium 138  135 - 145 (mEq/L)   Potassium 4.4  3.5 - 5.1 (mEq/L)   Chloride 104  96 - 112 (mEq/L)   CO2 26  19 - 32 (mEq/L)   Glucose, Bld 122 (*) 70 - 99 (mg/dL)   BUN 16  6 - 23 (mg/dL)   Creatinine, Ser 4.09  0.50 - 1.10 (mg/dL)   Calcium 9.1  8.4 - 81.1 (mg/dL)   GFR calc non Af Amer 69 (*) >90 (mL/min)   GFR calc Af Amer 80 (*) >90 (mL/min)  GLUCOSE, CAPILLARY     Status: Abnormal   Collection Time   08/01/11  7:36 AM      Component Value Range   Glucose-Capillary 127 (*) 70 - 99 (mg/dL)    Intake/Output Summary (Last 24 hours) at 08/01/11 0917 Last data filed at 08/01/11 0800  Gross per 24 hour  Intake    823 ml  Output    401 ml  Net    422 ml     EKG:  Atrial paced rhythm.  Rate 60.  No ischemic ST T wave changed  ASSESSMENT AND PLAN:  Active Problems:   Hypertension, benign:  BP OK.    Sinus node dysfunction:  Pacemaker follow up per primary MD  Chest pain syndrome: Enzymes pending this AM.  If negative no further inpatient work up.  She can have follow up with her outpatient cardiologist.     Rollene Rotunda 08/01/2011 9:17 AM

## 2011-08-01 NOTE — Progress Notes (Signed)
See rounding note from 08/01/2011

## 2011-08-01 NOTE — Discharge Summary (Signed)
[name] MRN: [id number] DOB/AGE: [birth year] 60 y.o.  Admit date: 07/30/2011 Discharge date: 08/01/2011  Primary Care Physician:  No primary provider on file.   Discharge Diagnoses:   Atypical Chest pain , Costochondritis, vs Gastritis.     Diagnoses Date Noted   . Nausea and vomiting in adult gastritis , resolved. 07/31/2011     Class: Acute  . Morbid obesity, BMI not known 07/31/2011   . Diabetes mellitus type 2, controlled 07/23/2011   . Hypertension, benign 07/23/2011   . Hyperlipemia, mixed 07/23/2011   . Chest pain syndrome 07/23/2011   . Sinus node dysfunction 07/23/2011              DISCHARGE MEDICATION: Current Discharge Medication List    START taking these medications   Details  oxyCODONE (OXY IR/ROXICODONE) 5 MG immediate release tablet Take 1 tablet (5 mg total) by mouth every 6 (six) hours as needed for pain. Qty: 30 tablet, Refills: 0      CONTINUE these medications which have CHANGED   Details  pantoprazole (PROTONIX) 40 MG tablet Take 1 tablet (40 mg total) by mouth 2 (two) times daily before a meal. Qty: 60 tablet, Refills: 0      CONTINUE these medications which have NOT CHANGED   Details  acetaminophen (TYLENOL) 325 MG tablet Take 2 tablets (650 mg total) by mouth every 6 (six) hours as needed (or Fever >/= 101). Qty: 30 tablet, Refills: 0    aspirin 81 MG tablet Take 81 mg by mouth daily.      atenolol (TENORMIN) 25 MG tablet Take 25 mg by mouth daily.      diazepam (VALIUM) 10 MG tablet Take 10 mg by mouth 2 (two) times daily as needed. For anxiety    metFORMIN (GLUCOPHAGE) 850 MG tablet Take 850 mg by mouth daily with breakfast.     nitroGLYCERIN (NITROSTAT) 0.4 MG SL tablet Place 1 tablet (0.4 mg total) under the tongue every 5 (five) minutes as needed for chest pain. Qty: 10 tablet, Refills: 0    ondansetron (ZOFRAN) 4 MG tablet Take 1 tablet (4 mg total) by mouth every 6 (six) hours as needed for nausea. Qty: 20 tablet,  Refills: 0    simvastatin (ZOCOR) 40 MG tablet Take 40 mg by mouth at bedtime.        STOP taking these medications     lisinopril-hydrochlorothiazide (PRINZIDE,ZESTORETIC) 10-12.5 MG per tablet            Consults: Treatment Team:  Marca Ancona, MD   SIGNIFICANT DIAGNOSTIC STUDIES:  Dg Chest 2 View  07/30/2011  *RADIOLOGY REPORT*  Clinical Data: Mid chest discomfort, radiating into the left neck. Weakness.  CHEST - 2 VIEW  Comparison: Chest radiograph performed 07/23/2011  Findings: The lungs are relatively well-aerated.  Minimal bibasilar atelectasis is noted.  There is no evidence of pleural effusion or pneumothorax.  The heart is mildly enlarged.  A pacemaker is noted at the left chest wall, with leads ending at the right atrium and right ventricle.  No acute osseous abnormalities are seen.  Clips are noted within the right upper quadrant, reflecting prior cholecystectomy.  IMPRESSION: Minimal bibasilar atelectasis; lungs otherwise clear.  Mild cardiomegaly noted.  Original Report Authenticated By: Tonia Ghent, M.D.   Dg Chest 2 View  07/23/2011  *RADIOLOGY REPORT*  Clinical Data: Chest pain  CHEST - 2 VIEW  Comparison: 12/25/2010  Findings: Stable left subclavian pacemaker and leads.  Heart is moderately enlarged.  Pulmonary  vascularity is within normal limits.  Low lung volumes and bibasilar atelectasis.  No sign of interstitial edema.  IMPRESSION: Cardiomegaly without edema.  Bibasilar atelectasis.  Original Report Authenticated By: Donavan Burnet, M.D.   Nm Pulmonary Per & Vent  07/31/2011  *RADIOLOGY REPORT*  Clinical Data:  Chest pain.  Shortness of breath.  Weakness. Pacemaker in place.  NUCLEAR MEDICINE VENTILATION - PERFUSION LUNG SCAN  Technique:  Wash-in, equilibrium, and wash-out phase ventilation images were obtained using Xe-133 gas.  Perfusion images were obtained in multiple projections after intravenous injection of Tc- 46m MAA.  Radiopharmaceuticals:  10 mCi  Xe-133 gas and 6 mCi Tc-87m MAA.  Comparison:  Chest radiographic examination 07/30/2011.  Findings:  Ventilation study:  A focal defect in the anterior aspect of the left hemithorax midportion corresponds to a transvenous pacemaker controller device.  No segmental or lobar defects are evident.  Perfusion study: A focal defect in the anterior aspect of the left hemithorax midportion corresponds to a transvenous pacemaker controller device.  No segmental or lobar defects are evident. A linear area of subsegmental decreased perfusion in the lingula corresponds to an area of atelectasis or fibrosis on the chest radiographic examination.  No ventilation perfusion mismatch is evident comparing anterior and posterior images.  IMPRESSION: No segmental or lobar ventilation or perfusion defects were evident.  No ventilation perfusion mismatch is evident comparing anterior and posterior images of perfusion and ventilation studies. Subsegmental area of decreased perfusion in the lingula corresponds to the area of atelectasis or fibrosis within the lingula.  Examination is low probability for pulmonary embolism.  Original Report Authenticated By: Crawford Givens, M.D.      Recent Results (from the past 240 hour(s))  MRSA PCR SCREENING     Status: Normal   Collection Time   07/24/11  3:28 AM      Component Value Range Status Comment   MRSA by PCR NEGATIVE  NEGATIVE  Final   MRSA PCR SCREENING     Status: Normal   Collection Time   07/31/11  8:31 AM      Component Value Range Status Comment   MRSA by PCR NEGATIVE  NEGATIVE  Final     BRIEF ADMITTING H & P: Chief Complaint:Pt is a 60 yr old female who was discharged from this facility on 07/27/11 after an evaluation for chest pain. Pt presents here again today complaining of chest pain. She states that is seemed to be getting better during the week, so besides calling for an appointment with her physician, she did not call because she was having ongoing chest pain. Pt  was ruled out last week and was evaluated by cardiology, but did not have a stress test. Pt states that today the pressure in her chest feels much worse. She states that it is worse with exertion, does not get worse with deep respiration or cough, and is associated with SOB and nausea and vomiting. Pt states that her pain in3/10 to 7/10 in severity. She denies pain in her legs with ambulation or swelling in her legs.   No resolved problems to display.  Active Hospital Problems  Diagnoses Date Noted   . Nausea and vomiting in adult 07/31/2011     Class: Acute  . Morbid obesity, BMI not known 07/31/2011   . Diabetes mellitus type 2, controlled 07/23/2011   . Hypertension, benign 07/23/2011   . Hyperlipemia, mixed 07/23/2011   . Chest pain syndrome 07/23/2011   . Sinus node dysfunction 07/23/2011  Resolved Hospital Problems  Diagnoses Date Noted Date Resolved     Disposition and Follow-up: Needs to follow up with cardiologist and PCP for chest pain.  Discharge Orders    Future Orders Please Complete By Expires   Diet - low sodium heart healthy      Increase activity slowly        Follow-up Information    Make an appointment in 5 days to follow up.        Hospital Course: 1Chest pain:  Probably secondary to costochondritis, and a component of gastritis. Patient was admitted to the step down unit. Cardiac enzymes x3 negative, EKG with no significant changes. Cardiology was consulted, they recommend workup as an outpatient. Patient will need to follow with her cardiologist. V scan was low probability for PE. Her chest pain is worse on palpation. Protonix dose was increased to twice a day. The day of discharge patient is improved condition. Relate that chest pain has improved. Patient will be discharged today with close follow up. She will be discharged on oxycodone. No NSAIDs  to avoid gastritis.  2 hypertension: blood pressure in the 100 range. I will hold for now  hydrochlorothiazide and lisinopril. This will need to be restarted by her primary care physician. I will continue with atenolol.  DISCHARGE EXAM: The physical exam is generally normal. Patient appears well, alert and oriented x 3, pleasant, cooperative. Vitals are as noted. Neck supple and free of adenopathy, or masses. No thyromegaly. PERLA. Ears, throat are normal. Lungs are clear to auscultation. Heart sounds are normal, no murmurs, clicks, gallops or rubs. Abdomen is soft, no tenderness, masses or organomegaly.   Extremities are normal. Peripheral pulses are normal. Screening neurological exam is normal without focal findings. Skin is normal without suspicious lesions noted.   Blood pressure 109/54, pulse 66, temperature 98.4 F (36.9 C), temperature source Oral, resp. rate 18, height 5\' 5"  (1.651 m), weight 93.6 kg (206 lb 5.6 oz), SpO2 93.00%.   Basename 08/01/11 0650 07/31/11 0353  NA 138 139  K 4.4 3.4*  CL 104 103  CO2 26 25  GLUCOSE 122* 130*  BUN 16 17  CREATININE 0.89 0.93  CALCIUM 9.1 8.7  MG -- --  PHOS -- --    Basename 07/31/11 0353 07/30/11 2217  WBC 10.4 13.9*  NEUTROABS -- 9.2*  HGB 11.6* 12.4  HCT 34.7* 36.4  MCV 86.1 85.0  PLT 294 273    Signed: Brendi Mccarroll,BELKYSMD 08/01/2011, 10:20 AM

## 2011-08-19 ENCOUNTER — Encounter (HOSPITAL_COMMUNITY): Payer: Self-pay | Admitting: Emergency Medicine

## 2011-08-19 ENCOUNTER — Inpatient Hospital Stay (HOSPITAL_COMMUNITY)
Admission: EM | Admit: 2011-08-19 | Discharge: 2011-08-22 | DRG: 313 | Disposition: A | Discharge status: Home / Self Care | Payer: Medicare Other | Attending: Internal Medicine | Admitting: Internal Medicine

## 2011-08-19 DIAGNOSIS — Z880 Allergy status to penicillin: Secondary | ICD-10-CM

## 2011-08-19 DIAGNOSIS — E119 Type 2 diabetes mellitus without complications: Secondary | ICD-10-CM | POA: Diagnosis present

## 2011-08-19 DIAGNOSIS — R0789 Other chest pain: Principal | ICD-10-CM | POA: Diagnosis present

## 2011-08-19 DIAGNOSIS — R079 Chest pain, unspecified: Secondary | ICD-10-CM | POA: Diagnosis present

## 2011-08-19 DIAGNOSIS — Z881 Allergy status to other antibiotic agents status: Secondary | ICD-10-CM

## 2011-08-19 DIAGNOSIS — Z91013 Allergy to seafood: Secondary | ICD-10-CM

## 2011-08-19 DIAGNOSIS — E7800 Pure hypercholesterolemia, unspecified: Secondary | ICD-10-CM | POA: Diagnosis present

## 2011-08-19 DIAGNOSIS — Z7982 Long term (current) use of aspirin: Secondary | ICD-10-CM

## 2011-08-19 DIAGNOSIS — Z808 Family history of malignant neoplasm of other organs or systems: Secondary | ICD-10-CM

## 2011-08-19 DIAGNOSIS — Z833 Family history of diabetes mellitus: Secondary | ICD-10-CM

## 2011-08-19 DIAGNOSIS — I10 Essential (primary) hypertension: Secondary | ICD-10-CM | POA: Diagnosis present

## 2011-08-19 DIAGNOSIS — Z96659 Presence of unspecified artificial knee joint: Secondary | ICD-10-CM

## 2011-08-19 DIAGNOSIS — E782 Mixed hyperlipidemia: Secondary | ICD-10-CM | POA: Diagnosis present

## 2011-08-19 DIAGNOSIS — I495 Sick sinus syndrome: Secondary | ICD-10-CM | POA: Diagnosis present

## 2011-08-19 DIAGNOSIS — Z888 Allergy status to other drugs, medicaments and biological substances status: Secondary | ICD-10-CM

## 2011-08-19 DIAGNOSIS — Z79899 Other long term (current) drug therapy: Secondary | ICD-10-CM

## 2011-08-19 DIAGNOSIS — Z8542 Personal history of malignant neoplasm of other parts of uterus: Secondary | ICD-10-CM

## 2011-08-19 DIAGNOSIS — Z8249 Family history of ischemic heart disease and other diseases of the circulatory system: Secondary | ICD-10-CM

## 2011-08-19 DIAGNOSIS — F411 Generalized anxiety disorder: Secondary | ICD-10-CM | POA: Diagnosis present

## 2011-08-19 DIAGNOSIS — Z801 Family history of malignant neoplasm of trachea, bronchus and lung: Secondary | ICD-10-CM

## 2011-08-19 LAB — DIFFERENTIAL
Basophils Relative: 0 % (ref 0–1)
Eosinophils Absolute: 0.1 10*3/uL (ref 0.0–0.7)
Monocytes Absolute: 0.7 10*3/uL (ref 0.1–1.0)
Monocytes Relative: 6 % (ref 3–12)
Neutrophils Relative %: 62 % (ref 43–77)

## 2011-08-19 LAB — CBC
Hemoglobin: 13.2 g/dL (ref 12.0–15.0)
MCH: 29.5 pg (ref 26.0–34.0)
MCHC: 33.3 g/dL (ref 30.0–36.0)
RDW: 14.7 % (ref 11.5–15.5)

## 2011-08-19 LAB — BASIC METABOLIC PANEL
BUN: 8 mg/dL (ref 6–23)
Creatinine, Ser: 0.82 mg/dL (ref 0.50–1.10)
GFR calc Af Amer: 88 mL/min — ABNORMAL LOW (ref >90)
GFR calc non Af Amer: 76 mL/min — ABNORMAL LOW (ref >90)
Potassium: 3.8 mEq/L (ref 3.5–5.1)

## 2011-08-19 MED ORDER — NITROGLYCERIN 2 % TD OINT
1.0000 [in_us] | TOPICAL_OINTMENT | Freq: Four times a day (QID) | TRANSDERMAL | Status: DC
  Administered 2011-08-19: 66.6667 [in_us] via TOPICAL
  Administered 2011-08-19 – 2011-08-22 (×8): 1 [in_us] via TOPICAL
  Filled 2011-08-19: qty 1
  Filled 2011-08-19: qty 30
  Filled 2011-08-19 (×2): qty 1

## 2011-08-19 MED ORDER — MORPHINE SULFATE 4 MG/ML IJ SOLN
4.0000 mg | Freq: Once | INTRAMUSCULAR | Status: AC
  Administered 2011-08-19: 4 mg via INTRAVENOUS
  Filled 2011-08-19: qty 1

## 2011-08-19 MED ORDER — ONDANSETRON HCL 4 MG/2ML IJ SOLN
4.0000 mg | Freq: Once | INTRAMUSCULAR | Status: AC
  Administered 2011-08-19: 4 mg via INTRAVENOUS
  Filled 2011-08-19: qty 2

## 2011-08-19 MED ORDER — ASPIRIN 81 MG PO CHEW
243.0000 mg | CHEWABLE_TABLET | Freq: Once | ORAL | Status: AC
Start: 2011-08-19 — End: 2011-08-19
  Administered 2011-08-19: 243 mg via ORAL
  Filled 2011-08-19: qty 3

## 2011-08-19 NOTE — ED Notes (Signed)
Pt states that she had onset of CP this am.  Pt states that pain has been increasing as the day has progressed.  Pt states that she has a HA with same. Pt has taken 4 NTG for CP.  Pt states that she has no relief of CP with NTG.  Pt states that she has SOB and weakness with same.  Pt has also had vomtiing

## 2011-08-19 NOTE — ED Notes (Signed)
Pt stated that she told er doctor that she was having pain and nausea. No new orders and pt made aware. Er doc in with trauma pt at present. Pt was resting until reassed.

## 2011-08-19 NOTE — ED Notes (Signed)
Pt reports still nauseated

## 2011-08-19 NOTE — ED Notes (Signed)
Er doctor made aware or=f removal of NTG and verbalized that it was ok to remove.

## 2011-08-20 ENCOUNTER — Emergency Department (HOSPITAL_COMMUNITY): Payer: Medicare Other

## 2011-08-20 ENCOUNTER — Other Ambulatory Visit: Payer: Self-pay

## 2011-08-20 ENCOUNTER — Encounter (HOSPITAL_COMMUNITY): Payer: Self-pay | Admitting: *Deleted

## 2011-08-20 DIAGNOSIS — R079 Chest pain, unspecified: Secondary | ICD-10-CM

## 2011-08-20 DIAGNOSIS — I249 Acute ischemic heart disease, unspecified: Secondary | ICD-10-CM | POA: Insufficient documentation

## 2011-08-20 LAB — CARDIAC PANEL(CRET KIN+CKTOT+MB+TROPI)
CK, MB: 4.2 ng/mL — ABNORMAL HIGH (ref 0.3–4.0)
Relative Index: INVALID (ref 0.0–2.5)
Relative Index: INVALID (ref 0.0–2.5)
Troponin I: <0.3 ng/mL (ref <0.30)
Troponin I: <0.3 ng/mL (ref <0.30)

## 2011-08-20 LAB — HEPARIN LEVEL (UNFRACTIONATED): Heparin Unfractionated: 0.53 IU/mL (ref 0.30–0.70)

## 2011-08-20 LAB — GLUCOSE, CAPILLARY: Glucose-Capillary: 150 mg/dL — ABNORMAL HIGH (ref 70–99)

## 2011-08-20 MED ORDER — ONDANSETRON HCL 4 MG/2ML IJ SOLN
4.0000 mg | Freq: Four times a day (QID) | INTRAMUSCULAR | Status: DC | PRN
  Administered 2011-08-20 – 2011-08-22 (×6): 4 mg via INTRAVENOUS
  Filled 2011-08-20 (×5): qty 2

## 2011-08-20 MED ORDER — HEPARIN SOD (PORCINE) IN D5W 100 UNIT/ML IV SOLN
1300.0000 [IU]/h | INTRAVENOUS | Status: DC
  Administered 2011-08-20 – 2011-08-21 (×3): 1300 [IU]/h via INTRAVENOUS
  Filled 2011-08-20 (×5): qty 250

## 2011-08-20 MED ORDER — CLOPIDOGREL BISULFATE 75 MG PO TABS
75.0000 mg | ORAL_TABLET | Freq: Every day | ORAL | Status: DC
  Administered 2011-08-21: 75 mg via ORAL
  Filled 2011-08-20 (×3): qty 1

## 2011-08-20 MED ORDER — ASPIRIN 81 MG PO TABS: 325.0000 mg | ORAL_TABLET | Freq: Every day | ORAL | Status: DC

## 2011-08-20 MED ORDER — METFORMIN HCL 850 MG PO TABS
850.0000 mg | ORAL_TABLET | Freq: Two times a day (BID) | ORAL | Status: DC
  Administered 2011-08-20 – 2011-08-21 (×3): 850 mg via ORAL
  Filled 2011-08-20 (×6): qty 1

## 2011-08-20 MED ORDER — ONDANSETRON HCL 4 MG/2ML IJ SOLN
INTRAMUSCULAR | Status: AC
  Filled 2011-08-20: qty 2

## 2011-08-20 MED ORDER — NITROGLYCERIN 0.4 MG SL SUBL: 0.4000 mg | SUBLINGUAL_TABLET | SUBLINGUAL | Status: DC | PRN

## 2011-08-20 MED ORDER — CLOPIDOGREL BISULFATE 300 MG PO TABS
600.0000 mg | ORAL_TABLET | Freq: Once | ORAL | Status: AC
  Administered 2011-08-20: 600 mg via ORAL
  Filled 2011-08-20 (×3): qty 2

## 2011-08-20 MED ORDER — INSULIN ASPART 100 UNIT/ML ~~LOC~~ SOLN
0.0000 [IU] | Freq: Three times a day (TID) | SUBCUTANEOUS | Status: DC
  Administered 2011-08-20 – 2011-08-21 (×2): 3 [IU] via SUBCUTANEOUS
  Administered 2011-08-21 – 2011-08-22 (×2): 2 [IU] via SUBCUTANEOUS
  Filled 2011-08-20: qty 3

## 2011-08-20 MED ORDER — ASPIRIN 325 MG PO TABS
325.0000 mg | ORAL_TABLET | Freq: Every day | ORAL | Status: DC
  Administered 2011-08-20 – 2011-08-21 (×2): 325 mg via ORAL
  Filled 2011-08-20 (×4): qty 1

## 2011-08-20 MED ORDER — ACETAMINOPHEN 325 MG PO TABS: 650.0000 mg | ORAL_TABLET | ORAL | Status: DC | PRN

## 2011-08-20 MED ORDER — INSULIN ASPART 100 UNIT/ML ~~LOC~~ SOLN: 0.0000 [IU] | Freq: Every day | SUBCUTANEOUS | Status: DC

## 2011-08-20 MED ORDER — MORPHINE SULFATE 2 MG/ML IJ SOLN
2.0000 mg | INTRAMUSCULAR | Status: DC | PRN
  Administered 2011-08-20 – 2011-08-22 (×10): 2 mg via INTRAVENOUS
  Filled 2011-08-20 (×9): qty 1

## 2011-08-20 MED ORDER — PANTOPRAZOLE SODIUM 40 MG PO TBEC
40.0000 mg | DELAYED_RELEASE_TABLET | Freq: Two times a day (BID) | ORAL | Status: DC
  Administered 2011-08-20 – 2011-08-22 (×5): 40 mg via ORAL
  Filled 2011-08-20 (×4): qty 1

## 2011-08-20 MED ORDER — HEPARIN BOLUS VIA INFUSION
4000.0000 [IU] | Freq: Once | INTRAVENOUS | Status: AC
  Administered 2011-08-20: 4000 [IU] via INTRAVENOUS
  Filled 2011-08-20: qty 4000

## 2011-08-20 MED ORDER — ATENOLOL 25 MG PO TABS
25.0000 mg | ORAL_TABLET | Freq: Every day | ORAL | Status: DC
  Administered 2011-08-20 – 2011-08-21 (×2): 25 mg via ORAL
  Filled 2011-08-20 (×4): qty 1

## 2011-08-20 MED ORDER — GLIPIZIDE 5 MG PO TABS
5.0000 mg | ORAL_TABLET | Freq: Every day | ORAL | Status: DC
  Administered 2011-08-20 – 2011-08-21 (×2): 5 mg via ORAL
  Filled 2011-08-20 (×5): qty 1

## 2011-08-20 MED ORDER — MORPHINE SULFATE 4 MG/ML IJ SOLN
4.0000 mg | Freq: Once | INTRAMUSCULAR | Status: AC
  Administered 2011-08-20: 4 mg via INTRAVENOUS
  Filled 2011-08-20: qty 1

## 2011-08-20 MED ORDER — MORPHINE SULFATE 2 MG/ML IJ SOLN
INTRAMUSCULAR | Status: AC
  Administered 2011-08-20: 2 mg via INTRAVENOUS
  Filled 2011-08-20: qty 1

## 2011-08-20 MED ORDER — SIMVASTATIN 40 MG PO TABS
40.0000 mg | ORAL_TABLET | Freq: Every day | ORAL | Status: DC
  Administered 2011-08-20 – 2011-08-21 (×2): 40 mg via ORAL
  Filled 2011-08-20 (×3): qty 1

## 2011-08-20 NOTE — ED Notes (Signed)
Attempt to call report. RN unable at this time.

## 2011-08-20 NOTE — ED Notes (Signed)
Pt c/o chest discomfort 6.5/10, radiates to left jaw, left arm. Pt receiving heparin, 4 mg morphine at 0535 today. Pt currently has 1' nitro paste applied to left chest. Pt reports this pain has persisted since Friday, reporting really obtained no relief despite pharmacological intervention

## 2011-08-20 NOTE — H&P (Signed)
[name] is an 60 y.o. female with a hx of permanent pacemaker implantation, HTN,DM and hyperlipidemia. Chief Complaint: chest pain HPI: Patient complaints of etrosternal pressure like pain, radiating to the neck, jaw and left arm, associated with diaphoresis and nausea and worsened by exertion. Of note she has been seen about three times in the last few weeks for pain which was almost always atypical and each time she was discharged to follow up with her cardiologist but never made it there. Last time she was here she had a VQ scan which was negative.   Past Medical History  Diagnosis Date  . Hypertension   . High cholesterol   . Pacemaker   . Diabetes mellitus   . Arthritis   . Headache   . PONV (postoperative nausea and vomiting)   . 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 Coronary artery disease   . Anxiety   . Fibromyalgia   . Dysrhythmia     Past Surgical History  Procedure Date  . Abdominal hysterectomy   . Fracture surgery     left ankle  . Joint replacement     right knee  . Insert / replace / remove pacemaker     2008  . Cholecystectomy     2005-2006    Family History  Problem Relation Age of Onset  . Coronary artery disease Mother     s/p stenting, currently 62  . Diabetes type II Mother   . Cancer Father     heavy smoker, died of lung cancer in 73  . Brain cancer Brother     ? CAD   Social History:  reports that she has never smoked. She does not have any smokeless tobacco history on file. She reports that she does not drink alcohol or use illicit drugs.  Allergies:  Allergies  Allergen Reactions  . Shellfish Allergy Anaphylaxis  . Decadron (Dexamethasone) Nausea And Vomiting  . Erythromycin Itching  . Heparin     Nosebleed  . Iohexol      Desc: pt states reaction is anaphalactic shock from IV dye and any kind of shellfish. pt states she almost died the last time she had a reaction.   Marland Kitchen Keflex Nausea And Vomiting  . Penicillins Itching  .  Prednisone Nausea And Vomiting  . Questran Nausea And Vomiting  . Robaxin Itching and Nausea And Vomiting  . Toradol Other (See Comments)    unknown    Medications Prior to Admission  Medication Dose Route Frequency Provider Last Rate Last Dose  . aspirin chewable tablet 243 mg  243 mg Oral Once Cyndra Numbers, MD   243 mg at 08/19/11 1652  . heparin 100 units/mL bolus via infusion 4,000 Units  4,000 Units Intravenous Once [facility] Abbott, PHARMD   4,000 Units at 08/20/11 0526  . heparin ADULT infusion 100 units/ml (25000 units/250 ml)  1,300 Units/hr Intravenous Continuous Gary Fleet Abbott, PHARMD 13 mL/hr at 08/20/11 0521 1,300 Units/hr at 08/20/11 0521  . morphine 4 MG/ML injection 4 mg  4 mg Intravenous Once Cyndra Numbers, MD   4 mg at 08/19/11 1756  . morphine 4 MG/ML injection 4 mg  4 mg Intravenous Once Cyndra Numbers, MD   4 mg at 08/19/11 2250  . morphine 4 MG/ML injection 4 mg  4 mg Intravenous Once Elyn Aquas., MD   4 mg at 08/20/11 0536  . nitroGLYCERIN (NITROGLYN) 2 % ointment 1 inch  1 inch Topical  Q6H Cyndra Numbers, MD   1 inch at 08/20/11 0520  . ondansetron (ZOFRAN) 4 MG/2ML injection           . ondansetron (ZOFRAN) injection 4 mg  4 mg Intravenous Once Cyndra Numbers, MD   4 mg at 08/19/11 1655  . ondansetron (ZOFRAN) injection 4 mg  4 mg Intravenous Once Cyndra Numbers, MD   4 mg at 08/19/11 2241  . ondansetron (ZOFRAN) injection 4 mg  4 mg Intravenous Q6H PRN Cyndra Numbers, MD   4 mg at 08/20/11 0542   Medications Prior to Admission  Medication Sig Dispense Refill  . aspirin 81 MG tablet Take 81 mg by mouth daily.        Marland Kitchen atenolol (TENORMIN) 25 MG tablet Take 25 mg by mouth daily.        . diazepam (VALIUM) 10 MG tablet Take 10 mg by mouth 2 (two) times daily as needed. For anxiety      . metFORMIN (GLUCOPHAGE) 850 MG tablet Take 850 mg by mouth 2 (two) times daily with a meal.       . nitroGLYCERIN (NITROSTAT) 0.4 MG SL tablet Place 1 tablet (0.4 mg total) under the  tongue every 5 (five) minutes as needed for chest pain.  10 tablet  0  . pantoprazole (PROTONIX) 40 MG tablet Take 1 tablet (40 mg total) by mouth 2 (two) times daily before a meal.  60 tablet  0  . simvastatin (ZOCOR) 40 MG tablet Take 40 mg by mouth at bedtime.          Results for orders placed during the hospital encounter of 08/19/11 (from the past 48 hour(s))  POCT I-STAT TROPONIN I     Status: Normal   Collection Time   08/19/11  4:44 PM      Component Value Range Comment   Troponin i, poc 0.01  0.00 - 0.08 (ng/mL)    Comment 3            CBC     Status: Abnormal   Collection Time   08/19/11  4:55 PM      Component Value Range Comment   WBC 10.9 (*) 4.0 - 10.5 (K/uL)    RBC 4.48  3.87 - 5.11 (MIL/uL)    Hemoglobin 13.2  12.0 - 15.0 (g/dL)    HCT 16.1  09.6 - 04.5 (%)    MCV 88.4  78.0 - 100.0 (fL)    MCH 29.5  26.0 - 34.0 (pg)    MCHC 33.3  30.0 - 36.0 (g/dL)    RDW 40.9  81.1 - 91.4 (%)    Platelets 267  150 - 400 (K/uL)   DIFFERENTIAL     Status: Normal   Collection Time   08/19/11  4:55 PM      Component Value Range Comment   Neutrophils Relative 62  43 - 77 (%)    Neutro Abs 6.8  1.7 - 7.7 (K/uL)    Lymphocytes Relative 31  12 - 46 (%)    Lymphs Abs 3.4  0.7 - 4.0 (K/uL)    Monocytes Relative 6  3 - 12 (%)    Monocytes Absolute 0.7  0.1 - 1.0 (K/uL)    Eosinophils Relative 1  0 - 5 (%)    Eosinophils Absolute 0.1  0.0 - 0.7 (K/uL)    Basophils Relative 0  0 - 1 (%)    Basophils Absolute 0.0  0.0 - 0.1 (K/uL)   BASIC METABOLIC  PANEL     Status: Abnormal   Collection Time   08/19/11  4:55 PM      Component Value Range Comment   Sodium 137  135 - 145 (mEq/L)    Potassium 3.8  3.5 - 5.1 (mEq/L)    Chloride 101  96 - 112 (mEq/L)    CO2 24  19 - 32 (mEq/L)    Glucose, Bld 103 (*) 70 - 99 (mg/dL)    BUN 8  6 - 23 (mg/dL)    Creatinine, Ser 1.61  0.50 - 1.10 (mg/dL)    Calcium 9.3  8.4 - 10.5 (mg/dL)    GFR calc non Af Amer 76 (*) >90 (mL/min)    GFR calc Af Amer 88  (*) >90 (mL/min)   POCT I-STAT TROPONIN I     Status: Normal   Collection Time   08/19/11  8:49 PM      Component Value Range Comment   Troponin i, poc 0.00  0.00 - 0.08 (ng/mL)    Comment 3             Dg Chest 2 View  08/20/2011  *RADIOLOGY REPORT*  Clinical Data: Left-sided chest pain, radiating into the left arm and left jaw.  Weakness and dizziness.  CHEST - 2 VIEW  Comparison: Chest radiograph performed 07/30/2011  Findings: The lungs are well-aerated.  Bibasilar linear opacities likely reflect atelectasis.  There is no evidence of pleural effusion or pneumothorax.  The heart is mildly enlarged; a pacemaker is noted at the left chest wall, with leads ending at the right atrium and right ventricle.  No acute osseous abnormalities are seen.  Clips are noted within the right upper quadrant, reflecting prior cholecystectomy.  IMPRESSION: Bibasilar linear airspace opacities likely reflect atelectasis; mild cardiomegaly noted.  Original Report Authenticated By: Tonia Ghent, M.D.    Review of Systems  HENT: Negative.   Eyes: Negative.   Respiratory: Negative.   Cardiovascular: Positive for chest pain.  Gastrointestinal: Negative.   Genitourinary: Negative.   Musculoskeletal: Negative.   Skin: Negative.   Neurological: Positive for weakness.  Endo/Heme/Allergies: Negative.   Psychiatric/Behavioral: Negative.     Blood pressure 112/58, pulse 68, temperature 98.7 F (37.1 C), temperature source Oral, resp. rate 16, height 5\' 5"  (1.651 m), weight 204 lb (92.534 kg), SpO2 100.00%. Physical Exam  Constitutional: She is oriented to person, place, and time. No distress.  HENT:  Head: Normocephalic.  Mouth/Throat: Oropharynx is clear and moist. No oropharyngeal exudate.  Eyes: Pupils are equal, round, and reactive to light. Right eye exhibits no discharge.  Neck: Normal range of motion. No JVD present.  Cardiovascular: Normal rate, regular rhythm and intact distal pulses.  Exam reveals no  gallop.   No murmur heard. Respiratory: Breath sounds normal.  GI: Soft. Bowel sounds are normal.  Musculoskeletal: Normal range of motion.  Lymphadenopathy:    She has no cervical adenopathy.  Neurological: She is alert and oriented to person, place, and time.  Skin: Skin is warm and dry. She is not diaphoretic.    EKG - sinus 72b/m, PR , Q in III, normal axis, incomplete RBBB, 0.26mm st depression in v3-6 (old)  Assessment/Plan 1)Acute coronary syndrome  Admit to Telemetry under Dr Jones Broom Serial cardiac enzymes ASA 325mg  daily Plavix 600mg  now then 75mg  daily Heparin drip NTG Atenolol 25 mg daily Zocor 40 mg  2D echo Nuclear stress test when pain resolves versus cardiac catheterization if pain continues.  2) DM  Will switch  metformin to glipizide in anticipation of possible cath   3) HTN Continue with atenolol Add enalapril 2.5mg  twice daily   Jacksen Isip 08/20/2011, 6:34 AM

## 2011-08-20 NOTE — ED Notes (Signed)
Pt in radiology when Cardioligist here to see. Will page when pt returns.

## 2011-08-20 NOTE — Progress Notes (Signed)
ANTICOAGULATION CONSULT NOTE - Follow Up Consult  Pharmacy Consult for Heparin Indication: Chest pain/ACS  Allergies  Allergen Reactions  . Shellfish Allergy Anaphylaxis  . Decadron (Dexamethasone) Nausea And Vomiting  . Erythromycin Itching  . Heparin     Nosebleed  . Iohexol      Desc: pt states reaction is anaphalactic shock from IV dye and any kind of shellfish. pt states she almost died the last time she had a reaction.   Marland Kitchen Keflex Nausea And Vomiting  . Penicillins Itching  . Prednisone Nausea And Vomiting  . Questran Nausea And Vomiting  . Robaxin Itching and Nausea And Vomiting  . Toradol Other (See Comments)    unknown    Patient Measurements: Height: 5' 0.5" (153.7 cm) Weight: 206 lb 5.6 oz (93.6 kg) IBW/kg (Calculated) : 46.65  Heparin Dosing Weight: 68.9 kg  Vital Signs: Temp: 97.7 F (36.5 C) (12/02 1300) Temp src: Oral (12/02 1300) BP: 135/80 mmHg (12/02 1300) Pulse Rate: 72  (12/02 1300)  Labs:  Basename 08/20/11 1120 08/20/11 0704 08/19/11 1655  HGB -- -- 13.2  HCT -- -- 39.6  PLT -- -- 267  APTT -- -- --  LABPROT -- -- --  INR -- -- --  HEPARINUNFRC 0.53 -- --  CREATININE -- -- 0.82  CKTOTAL 55 52 --  CKMB 4.1* 3.4 --  TROPONINI <0.30 <0.30 --   Estimated Creatinine Clearance: 75.4 ml/min (by C-G formula based on Cr of 0.82).   Medications:  Prescriptions prior to admission  Medication Sig Dispense Refill  . aspirin 81 MG tablet Take 81 mg by mouth daily.        Marland Kitchen atenolol (TENORMIN) 25 MG tablet Take 25 mg by mouth daily.        . diazepam (VALIUM) 10 MG tablet Take 10 mg by mouth 2 (two) times daily as needed. For anxiety      . metFORMIN (GLUCOPHAGE) 850 MG tablet Take 850 mg by mouth 2 (two) times daily with a meal.       . nitroGLYCERIN (NITROSTAT) 0.4 MG SL tablet Place 1 tablet (0.4 mg total) under the tongue every 5 (five) minutes as needed for chest pain.  10 tablet  0  . ondansetron (ZOFRAN) 4 MG tablet Take 4 mg by mouth every  6 (six) hours as needed. nausea       . oxyCODONE (OXY IR/ROXICODONE) 5 MG immediate release tablet Take 5 mg by mouth every 6 (six) hours as needed. pain       . pantoprazole (PROTONIX) 40 MG tablet Take 1 tablet (40 mg total) by mouth 2 (two) times daily before a meal.  60 tablet  0  . simvastatin (ZOCOR) 40 MG tablet Take 40 mg by mouth at bedtime.         Scheduled:    . aspirin  243 mg Oral Once  . aspirin  325 mg Oral Daily  . atenolol  25 mg Oral Daily  . clopidogrel  600 mg Oral Once  . clopidogrel  75 mg Oral Q breakfast  . glipiZIDE  5 mg Oral QAC breakfast  . heparin  4,000 Units Intravenous Once  . insulin aspart  0-15 Units Subcutaneous TID WC  . insulin aspart  0-5 Units Subcutaneous QHS  . metFORMIN  850 mg Oral BID WC  .  morphine injection  4 mg Intravenous Once  .  morphine injection  4 mg Intravenous Once  .  morphine injection  4 mg  Intravenous Once  . nitroGLYCERIN  1 inch Topical Q6H  . ondansetron      . ondansetron  4 mg Intravenous Once  . ondansetron  4 mg Intravenous Once  . pantoprazole  40 mg Oral BID AC  . simvastatin  40 mg Oral QHS  . DISCONTD: aspirin  325 mg Oral Daily    Assessment: 60 y.o. F on heparin for ACS sx with a therapeutic heparin level this afternoon. No CBC this a.m. No s/sx of bleeding noted. Heparin infusing at appropriate rate. Noted patient awaiting stress test vs cardiac cath.  Goal of Therapy:  Heparin level 0.3-0.7 units/ml   Plan:  1. Continue heparin at current rate of 1300 units/hr (13 ml/hr) 2. Will continue to monitor for any signs/symptoms of bleeding and will follow up with heparin level in 6 hours to confirm therapeutic.  Rolley Sims 08/20/2011,2:14 PM

## 2011-08-20 NOTE — ED Provider Notes (Signed)
History     CSN: [id number] Arrival date & time: 08/19/2011  3:12 PM   First MD Initiated Contact with Patient 08/19/11 1553      Chief Complaint  Patient presents with  . Chest Pain    (Consider location/radiation/quality/duration/timing/severity/associated sxs/prior treatment) HPI Patient is a 60 year old female with history of pacemaker placement and mild heart failure who presents today complaining of chest pain. This began last night. This is then left-sided and is persistent. It radiates to the left arm and upper face. Patient has some associated shortness of breath, nausea, and abdominal discomfort. Patient was admitted to this hospital from November 4 to November 7 for chest pain rule out. No provocative testing or catheterization was performed at that time. Patient has a cardiologist named Dr. Ripley Fraise in [place] and a primary care doctor named Dr. Judith Blonder in [place]. When patient is asked why she is not going to hospitals where her doctors are she states that she doesn't like those hospitals as much. Patient took 4 nitroglycerin as prior to presentation. Her pain was a 10 out of 10 before she took these for now as an 8/10. There is nothing that makes the pain better or worse. She has no associated neurologic deficits. She's had a recent cough. Patient has no history of blood clots in legs or lungs. There no other associated or modifying factors. Past Medical History  Diagnosis Date  . Hypertension   . High cholesterol   . Pacemaker   . Diabetes mellitus   . Arthritis   . Headache   . PONV (postoperative nausea and vomiting)   . 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 Coronary artery disease   . Anxiety   . Fibromyalgia   . Dysrhythmia     Past Surgical History  Procedure Date  . Abdominal hysterectomy   . Fracture surgery     left ankle  . Joint replacement     right knee  . Insert / replace / remove pacemaker     2008  . Cholecystectomy     2005-2006    Family History    Problem Relation Age of Onset  . Coronary artery disease Mother     s/p stenting, currently 63  . Diabetes type II Mother   . Cancer Father     heavy smoker, died of lung cancer in 71  . Brain cancer Brother     ? CAD    History  Substance Use Topics  . Smoking status: Never Smoker   . Smokeless tobacco: Not on file  . Alcohol Use: No    OB History    Grav Para Term Preterm Abortions TAB SAB Ect Mult Living                  Review of Systems  Constitutional: Negative.   HENT: Negative.   Eyes: Negative.   Respiratory: Negative.   Cardiovascular: Positive for chest pain.  Gastrointestinal: Positive for nausea and abdominal pain.  Genitourinary: Negative.   Musculoskeletal: Negative.   Skin: Negative.   Neurological: Negative.   Hematological: Negative.   Psychiatric/Behavioral: Negative.   All other systems reviewed and are negative.    Allergies  Shellfish allergy; Decadron; Erythromycin; Heparin; Iohexol; Keflex; Penicillins; Prednisone; Questran; Robaxin; and Toradol  Home Medications   Current Outpatient Rx  Name Route Sig Dispense Refill  . ASPIRIN 81 MG PO TABS Oral Take 81 mg by mouth daily.      . ATENOLOL 25  MG PO TABS Oral Take 25 mg by mouth daily.      Marland Kitchen DIAZEPAM 10 MG PO TABS Oral Take 10 mg by mouth 2 (two) times daily as needed. For anxiety    . METFORMIN HCL 850 MG PO TABS Oral Take 850 mg by mouth 2 (two) times daily with a meal.     . NITROGLYCERIN 0.4 MG SL SUBL Sublingual Place 1 tablet (0.4 mg total) under the tongue every 5 (five) minutes as needed for chest pain. 10 tablet 0  . ONDANSETRON HCL 4 MG PO TABS Oral Take 4 mg by mouth every 6 (six) hours as needed. nausea     . OXYCODONE HCL 5 MG PO TABS Oral Take 5 mg by mouth every 6 (six) hours as needed. pain     . PANTOPRAZOLE SODIUM 40 MG PO TBEC Oral Take 1 tablet (40 mg total) by mouth 2 (two) times daily before a meal. 60 tablet 0  . SIMVASTATIN 40 MG PO TABS Oral Take 40 mg by  mouth at bedtime.        BP 104/53  Pulse 68  Temp(Src) 97.8 F (36.6 C) (Oral)  Resp 13  SpO2 94%  Physical Exam  Constitutional: She is oriented to person, place, and time. She appears well-developed and well-nourished. No distress.  HENT:  Head: Normocephalic and atraumatic.  Eyes: Conjunctivae and EOM are normal. Pupils are equal, round, and reactive to light.  Neck: Normal range of motion.  Cardiovascular: Normal rate, regular rhythm, normal heart sounds and intact distal pulses.  Exam reveals no gallop and no friction rub.   No murmur heard. Pulmonary/Chest: Effort normal and breath sounds normal. No respiratory distress. She has no wheezes. She has no rales.  Abdominal: Soft. Bowel sounds are normal. She exhibits no distension. There is no tenderness. There is no rebound and no guarding.  Musculoskeletal: Normal range of motion.  Neurological: She is alert and oriented to person, place, and time. No cranial nerve deficit. She exhibits normal muscle tone. Coordination normal.  Skin: Skin is warm and dry. No rash noted.  Psychiatric:       Depressed affect    ED Course  Procedures (including critical care time)  Date: 08/20/2011  Rate: 72  Rhythm: normal sinus rhythm  QRS Axis: normal  Intervals: normal  ST/T Wave abnormalities: nonspecific T wave changes  Conduction Disutrbances:none  Narrative Interpretation:   Old EKG Reviewed: unchanged   Labs Reviewed  CBC - Abnormal; Notable for the following:    WBC 10.9 (*)    All other components within normal limits  BASIC METABOLIC PANEL - Abnormal; Notable for the following:    Glucose, Bld 103 (*)    GFR calc non Af Amer 76 (*)    GFR calc Af Amer 88 (*)    All other components within normal limits  DIFFERENTIAL  POCT I-STAT TROPONIN I  POCT I-STAT TROPONIN I  I-STAT TROPONIN I   No results found.   1. Chest pain       MDM  Patient was evaluated by myself. She had very atypical chest pain. Workup for  this was ordered. Patient's EKG was unchanged from previously. She had negative cardiac enzymes at 0 and 3 hours. CBC was remarkable only for a very mild leukocytosis at 10.9. Patient's pain did not change with nitroglycerin paste. She also did not have any resolution with morphine. Patient did receive aspirin prior to arrival. Patient continued to have persistent pain. We  discussed that it would be advisable for her to go to hospitals where her normal physicians could see her. Patient however did not have any history of provocative testing a recent catheterization. She is a diabetic and complains of persistent chest pain. Chest x-ray was then firmly delayed. This was performed.  Read is pending at this time. On my evaluation there does not appear to be any acute cardiopulmonary process. Patient was accepted for admission to the hospitalist.        Cyndra Numbers, MD 08/20/11 [phone]

## 2011-08-20 NOTE — Progress Notes (Signed)
ANTICOAGULATION CONSULT NOTE - Initial Consult  Pharmacy Consult for Heparin Indication: chest pain/ACS  Allergies  Allergen Reactions  . Shellfish Allergy Anaphylaxis  . Decadron (Dexamethasone) Nausea And Vomiting  . Erythromycin Itching  . Heparin     Nosebleed  . Iohexol      Desc: pt states reaction is anaphalactic shock from IV dye and any kind of shellfish. pt states she almost died the last time she had a reaction.   Marland Kitchen Keflex Nausea And Vomiting  . Penicillins Itching  . Prednisone Nausea And Vomiting  . Questran Nausea And Vomiting  . Robaxin Itching and Nausea And Vomiting  . Toradol Other (See Comments)    unknown    Patient Measurements: Height: 5\' 5"  (165.1 cm) Weight: 204 lb (92.534 kg) IBW/kg (Calculated) : 57   Vital Signs: Temp: 97.8 F (36.6 C) (12/01 2326) Temp src: Oral (12/01 2326) BP: 116/67 mmHg (12/02 0420) Pulse Rate: 67  (12/02 0420)  Labs:  Basename 08/19/11 1655  HGB 13.2  HCT 39.6  PLT 267  APTT --  LABPROT --  INR --  HEPARINUNFRC --  CREATININE 0.82  CKTOTAL --  CKMB --  TROPONINI --   Estimated Creatinine Clearance: 82 ml/min (by C-G formula based on Cr of 0.82).  Medical History: Past Medical History  Diagnosis Date  . Hypertension   . High cholesterol   . Pacemaker   . Diabetes mellitus   . Arthritis   . Headache   . PONV (postoperative nausea and vomiting)   . 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 Coronary artery disease   . Anxiety   . Fibromyalgia   . Dysrhythmia     Assessment  60 yo female with chest pain for Heparin.  Noted pt with recent admit 11/4-11/7, on heparin at that time  Goal of Therapy:  Heparin level 0.3-0.7 units/ml   Plan:  Heparin 4000 units IV bolus, then 1300 units/hr Check 6 hour heparin level   Eustace Hur, Gary Fleet 08/20/2011,4:28 AM

## 2011-08-20 NOTE — ED Notes (Signed)
Noted on patient chart allergy to heparin, pt asked about allergy, reporting heparin has caused her nosebleeds in past. Pt denying any life threatening allergy to heparin

## 2011-08-20 NOTE — Progress Notes (Signed)
ANTICOAGULATION CONSULT NOTE - Follow Up Consult  Pharmacy Consult for Heparin Indication: Chest pain/ACS  Assessment: 60 y.o. F on heparin for ACS sx with 2nd therapeutic heparin level this evening No s/sx of bleeding noted. Heparin infusing at appropriate rate. Noted patient awaiting stress test vs cardiac cath.  Goal of Therapy:  Heparin level 0.3-0.7 units/ml   Plan:  1. Continue heparin at current rate of 1300 units/hr (13 ml/hr) 2. Will continue to monitor for any signs/symptoms of bleeding and will follow up with heparin level in the a.m.   Rolley Sims 08/20/2011,7:34 PM

## 2011-08-20 NOTE — Progress Notes (Signed)
Pt. C/o substernal CP, non-radiating, no SOB/Vomiting/Diaphoresis. No acute EKG changes from ED EKG. PAC notified. See orders. Will continue to monitor. Harlow Asa

## 2011-08-21 ENCOUNTER — Other Ambulatory Visit: Payer: Self-pay

## 2011-08-21 DIAGNOSIS — R079 Chest pain, unspecified: Secondary | ICD-10-CM

## 2011-08-21 LAB — BASIC METABOLIC PANEL
BUN: 10 mg/dL (ref 6–23)
Chloride: 101 mEq/L (ref 96–112)
GFR calc Af Amer: >90 mL/min (ref >90)
Potassium: 3.9 mEq/L (ref 3.5–5.1)
Sodium: 137 mEq/L (ref 135–145)

## 2011-08-21 LAB — CBC
HCT: 34.4 % — ABNORMAL LOW (ref 36.0–46.0)
HCT: 34.5 % — ABNORMAL LOW (ref 36.0–46.0)
Hemoglobin: 11.2 g/dL — ABNORMAL LOW (ref 12.0–15.0)
MCHC: 32.5 g/dL (ref 30.0–36.0)
Platelets: 282 10*3/uL (ref 150–400)
RBC: 3.89 MIL/uL (ref 3.87–5.11)
RDW: 15 % (ref 11.5–15.5)
WBC: 8.5 10*3/uL (ref 4.0–10.5)
WBC: 7.8 10*3/uL (ref 4.0–10.5)

## 2011-08-21 LAB — GLUCOSE, CAPILLARY
Glucose-Capillary: 109 mg/dL — ABNORMAL HIGH (ref 70–99)
Glucose-Capillary: 149 mg/dL — ABNORMAL HIGH (ref 70–99)
Glucose-Capillary: 137 mg/dL — ABNORMAL HIGH (ref 70–99)

## 2011-08-21 LAB — LIPID PANEL
HDL: 34 mg/dL — ABNORMAL LOW (ref >39)
LDL Cholesterol: 138 mg/dL — ABNORMAL HIGH (ref 0–99)
VLDL: 46 mg/dL — ABNORMAL HIGH (ref 0–40)

## 2011-08-21 LAB — PROTIME-INR
INR: 1.01 (ref 0.00–1.49)
Prothrombin Time: 13.5 seconds (ref 11.6–15.2)

## 2011-08-21 LAB — HEPARIN LEVEL (UNFRACTIONATED): Heparin Unfractionated: 0.49 IU/mL (ref 0.30–0.70)

## 2011-08-21 MED ORDER — SODIUM CHLORIDE 0.9 % IJ SOLN: 3.0000 mL | INTRAMUSCULAR | Status: DC | PRN

## 2011-08-21 MED ORDER — ASPIRIN 81 MG PO CHEW
324.0000 mg | CHEWABLE_TABLET | ORAL | Status: AC
  Administered 2011-08-22: 324 mg via ORAL
  Filled 2011-08-21: qty 4

## 2011-08-21 MED ORDER — SODIUM CHLORIDE 0.9 % IJ SOLN: 3.0000 mL | Freq: Two times a day (BID) | INTRAMUSCULAR | Status: DC

## 2011-08-21 MED ORDER — SODIUM CHLORIDE 0.9 % IV SOLN
1.0000 mL/kg/h | INTRAVENOUS | Status: DC
  Administered 2011-08-22: 1 mL/kg/h via INTRAVENOUS

## 2011-08-21 MED ORDER — SODIUM CHLORIDE 0.9 % IJ SOLN
10.0000 mL | INTRAMUSCULAR | Status: DC | PRN
  Administered 2011-08-22: 10 mL

## 2011-08-21 MED ORDER — SODIUM CHLORIDE 0.9 % IV SOLN: 250.0000 mL | INTRAVENOUS | Status: DC | PRN

## 2011-08-21 NOTE — Progress Notes (Addendum)
Patient ID: [name], female   DOB: [birth year], 60 y.o.   MRN: [id number] [facility]  SUBJECTIVE:Patient continues to have mild chest pain that comes and goes.  She ruled out for MI.    Filed Vitals:   08/20/11 0855 08/20/11 1300 08/20/11 2213 08/21/11 0411  BP: 136/84 135/80 107/67 98/57  Pulse: 70 72 66 72  Temp: 97.3 F (36.3 C) 97.7 F (36.5 C) 98.9 F (37.2 C) 97.8 F (36.6 C)  TempSrc: Oral Oral Oral Oral  Resp: 20 18 18 18   Height: 5' 0.5" (1.537 m)     Weight: 93.6 kg (206 lb 5.6 oz)   92.488 kg (203 lb 14.4 oz)  SpO2: 94% 94% 91% 93%    Intake/Output Summary (Last 24 hours) at 08/21/11 0815 Last data filed at 08/21/11 0700  Gross per 24 hour  Intake    529 ml  Output    550 ml  Net    -21 ml    LABS: Basic Metabolic Panel:  Basename 08/21/11 0008 08/19/11 1655  NA 137 137  K 3.9 3.8  CL 101 101  CO2 25 24  GLUCOSE 112* 103*  BUN 10 8  CREATININE 0.80 0.82  CALCIUM 8.5 9.3  MG -- --  PHOS -- --   Liver Function Tests: No results found for this basename: AST:2,ALT:2,ALKPHOS:2,BILITOT:2,PROT:2,ALBUMIN:2 in the last 72 hours No results found for this basename: LIPASE:2,AMYLASE:2 in the last 72 hours CBC:  Basename 08/21/11 0500 08/21/11 0008 08/19/11 1655  WBC 7.8 8.5 --  NEUTROABS -- -- 6.8  HGB 11.2* 11.1* --  HCT 34.5* 34.4* --  MCV 88.7 88.4 --  PLT 201 282 --   Cardiac Enzymes:  Basename 08/20/11 1743 08/20/11 1120 08/20/11 0704  CKTOTAL 60 55 52  CKMB 4.2* 4.1* 3.4  CKMBINDEX -- -- --  TROPONINI <0.30 <0.30 <0.30      . aspirin  325 mg Oral Daily  . atenolol  25 mg Oral Daily  . clopidogrel  600 mg Oral Once  . clopidogrel  75 mg Oral Q breakfast  . glipiZIDE  5 mg Oral QAC breakfast  . insulin aspart  0-15 Units Subcutaneous TID WC  . insulin aspart  0-5 Units Subcutaneous QHS  . nitroGLYCERIN  1 inch Topical Q6H  . ondansetron      . pantoprazole  40 mg Oral BID AC  . simvastatin  40 mg Oral QHS  . DISCONTD:  aspirin  325 mg Oral Daily  . DISCONTD: metFORMIN  850 mg Oral BID WC    Fasting Lipid Panel:  Basename 08/21/11 0007  CHOL 218*  HDL 34*  LDLCALC 138*  TRIG 232*  CHOLHDL 6.4  LDLDIRECT --   Thyroid Function Tests: No results found for this basename: TSH,T4TOTAL,FREET3,T3FREE,THYROIDAB in the last 72 hours Anemia Panel: No results found for this basename: VITAMINB12,FOLATE,FERRITIN,TIBC,IRON,RETICCTPCT in the last 72 hours  RADIOLOGY: Nm Pulmonary Per & Vent  07/31/2011  *RADIOLOGY REPORT*  Clinical Data:  Chest pain.  Shortness of breath.  Weakness. Pacemaker in place.  NUCLEAR MEDICINE VENTILATION - PERFUSION LUNG SCAN  Technique:  Wash-in, equilibrium, and wash-out phase ventilation images were obtained using Xe-133 gas.  Perfusion images were obtained in multiple projections after intravenous injection of Tc- 89m MAA.  Radiopharmaceuticals:  10 mCi Xe-133 gas and 6 mCi Tc-54m MAA.  Comparison:  Chest radiographic examination 07/30/2011.  Findings:  Ventilation study:  A focal defect in the anterior aspect of the left hemithorax midportion corresponds to  a transvenous pacemaker controller device.  No segmental or lobar defects are evident.  Perfusion study: A focal defect in the anterior aspect of the left hemithorax midportion corresponds to a transvenous pacemaker controller device.  No segmental or lobar defects are evident. A linear area of subsegmental decreased perfusion in the lingula corresponds to an area of atelectasis or fibrosis on the chest radiographic examination.  No ventilation perfusion mismatch is evident comparing anterior and posterior images.  IMPRESSION: No segmental or lobar ventilation or perfusion defects were evident.  No ventilation perfusion mismatch is evident comparing anterior and posterior images of perfusion and ventilation studies. Subsegmental area of decreased perfusion in the lingula corresponds to the area of atelectasis or fibrosis within the lingula.   Examination is low probability for pulmonary embolism.  Original Report Authenticated By: Crawford Givens, M.D.    PHYSICAL EXAM General: NAD, obese Neck: No JVD, no thyromegaly or thyroid nodule.  Lungs: Clear to auscultation bilaterally with normal respiratory effort. CV: Nondisplaced PMI.  Heart regular S1/S2, no S3/S4, no murmur.  No peripheral edema.  No carotid bruit.   Abdomen: Soft, nontender, no hepatosplenomegaly, no distention.  Neurologic: Alert and oriented x 3.  Psych: Normal affect. Extremities: No clubbing or cyanosis.   ASSESSMENT AND PLAN: 60 yo with DM and pacemaker for symptomatic bradycardia presents again with chest pain.  She has been seen here several times in the past with atypical chest pain and was discharged to followup with her primary cardiologist in [place], but this did not happen.  She returns with prolonged chest pain again and negative cardiac enzymes.  ECG showed LVH with anterior T wave inversions, similar to prior ECGs.  She had a heart cath per report in 2008 that was negative.   - I will arrange a Lexiscan myoview this morning.  - Stop metformin in case cath is needed.  - Continue other meds.   Marca Ancona 08/21/2011 8:22 AM  No vascular access so missed chance today to have myoview.  She will be getting a PICC.  I think given on and off chest pain, will do definitive test tomorrow (left heart cath).    Marca Ancona 08/21/2011 1:40 PM

## 2011-08-21 NOTE — Progress Notes (Addendum)
ANTICOAGULATION CONSULT NOTE - Follow Up Consult  Pharmacy Consult for Heparin Indication: Chest pain/ACS  Allergies  Allergen Reactions  . Shellfish Allergy Anaphylaxis  . Decadron (Dexamethasone) Nausea And Vomiting  . Erythromycin Itching  . Heparin     Nosebleed  . Iohexol      Desc: pt states reaction is anaphalactic shock from IV dye and any kind of shellfish. pt states she almost died the last time she had a reaction.   Marland Kitchen Keflex Nausea And Vomiting  . Penicillins Itching  . Prednisone Nausea And Vomiting  . Questran Nausea And Vomiting  . Robaxin Itching and Nausea And Vomiting  . Toradol Other (See Comments)    unknown    Patient Measurements: Height: 5' 0.5" (153.7 cm) Weight: 203 lb 14.4 oz (92.488 kg) IBW/kg (Calculated) : 46.65  Heparin Dosing Weight: 68.9 kg  Vital Signs: Temp: 97.8 F (36.6 C) (12/03 0411) Temp src: Oral (12/03 0411) BP: 98/70 mmHg (12/03 1006) Pulse Rate: 72  (12/03 1006)  Labs:  Basename 08/21/11 0500 08/21/11 0008 08/20/11 1743 08/20/11 1120 08/20/11 0704 08/19/11 1655  HGB 11.2* 11.1* -- -- -- --  HCT 34.5* 34.4* -- -- -- 39.6  PLT 201 282 -- -- -- 267  APTT -- -- -- -- -- --  LABPROT -- 13.5 -- -- -- --  INR -- 1.01 -- -- -- --  HEPARINUNFRC 0.49 -- 0.49 0.53 -- --  CREATININE -- 0.80 -- -- -- 0.82  CKTOTAL -- -- 60 55 52 --  CKMB -- -- 4.2* 4.1* 3.4 --  TROPONINI -- -- <0.30 <0.30 <0.30 --   Estimated Creatinine Clearance: 76.7 ml/min (by C-G formula based on Cr of 0.8).   Medications:  Prescriptions prior to admission  Medication Sig Dispense Refill  . aspirin 81 MG tablet Take 81 mg by mouth daily.        Marland Kitchen atenolol (TENORMIN) 25 MG tablet Take 25 mg by mouth daily.        . diazepam (VALIUM) 10 MG tablet Take 10 mg by mouth 2 (two) times daily as needed. For anxiety      . metFORMIN (GLUCOPHAGE) 850 MG tablet Take 850 mg by mouth 2 (two) times daily with a meal.       . nitroGLYCERIN (NITROSTAT) 0.4 MG SL tablet  Place 1 tablet (0.4 mg total) under the tongue every 5 (five) minutes as needed for chest pain.  10 tablet  0  . ondansetron (ZOFRAN) 4 MG tablet Take 4 mg by mouth every 6 (six) hours as needed. nausea       . oxyCODONE (OXY IR/ROXICODONE) 5 MG immediate release tablet Take 5 mg by mouth every 6 (six) hours as needed. pain       . pantoprazole (PROTONIX) 40 MG tablet Take 1 tablet (40 mg total) by mouth 2 (two) times daily before a meal.  60 tablet  0  . simvastatin (ZOCOR) 40 MG tablet Take 40 mg by mouth at bedtime.         Scheduled:     . aspirin  325 mg Oral Daily  . atenolol  25 mg Oral Daily  . clopidogrel  75 mg Oral Q breakfast  . glipiZIDE  5 mg Oral QAC breakfast  . insulin aspart  0-15 Units Subcutaneous TID WC  . insulin aspart  0-5 Units Subcutaneous QHS  . nitroGLYCERIN  1 inch Topical Q6H  . ondansetron      . pantoprazole  40 mg  Oral BID AC  . simvastatin  40 mg Oral QHS  . sodium chloride  3 mL Intravenous Q12H  . DISCONTD: metFORMIN  850 mg Oral BID WC    Assessment: 60 y.o. F on heparin for ACS. Heparin level is therapeutic. No bleeding noted. Awaiting cath tomorrow.  Goal of Therapy:  Heparin level 0.3-0.7 units/ml   Plan:  1. Continue heparin at current rate of 1300 units/hr (13 ml/hr) 2. Will continue to monitor for any signs/symptoms of bleeding  [place], Maryanna Shape 08/21/2011,1:55 PM

## 2011-08-22 ENCOUNTER — Inpatient Hospital Stay (HOSPITAL_COMMUNITY): Payer: Medicare Other

## 2011-08-22 ENCOUNTER — Encounter (HOSPITAL_COMMUNITY)
Admission: EM | Disposition: A | Discharge status: Home / Self Care | Payer: Self-pay | Source: Home / Self Care | Attending: Internal Medicine

## 2011-08-22 DIAGNOSIS — R079 Chest pain, unspecified: Secondary | ICD-10-CM

## 2011-08-22 LAB — CBC
MCV: 87.9 fL (ref 78.0–100.0)
Platelets: 239 10*3/uL (ref 150–400)
RBC: 3.64 MIL/uL — ABNORMAL LOW (ref 3.87–5.11)
RDW: 14.7 % (ref 11.5–15.5)
WBC: 8.8 10*3/uL (ref 4.0–10.5)

## 2011-08-22 LAB — GLUCOSE, CAPILLARY: Glucose-Capillary: 137 mg/dL — ABNORMAL HIGH (ref 70–99)

## 2011-08-22 LAB — HEPARIN LEVEL (UNFRACTIONATED): Heparin Unfractionated: 0.46 IU/mL (ref 0.30–0.70)

## 2011-08-22 SURGERY — LEFT HEART CATHETERIZATION WITH CORONARY ANGIOGRAM
Anesthesia: LOCAL

## 2011-08-22 MED ORDER — TECHNETIUM TC 99M TETROFOSMIN IV KIT
30.0000 | PACK | Freq: Once | INTRAVENOUS | Status: AC | PRN
Start: 2011-08-22 — End: 2011-08-22
  Administered 2011-08-22: 30 via INTRAVENOUS

## 2011-08-22 MED ORDER — REGADENOSON 0.4 MG/5ML IV SOLN
0.4000 mg | Freq: Once | INTRAVENOUS | Status: AC
  Administered 2011-08-22: 0.4 mg via INTRAVENOUS
  Filled 2011-08-22: qty 5

## 2011-08-22 MED ORDER — TECHNETIUM TC 99M TETROFOSMIN IV KIT
10.0000 | PACK | Freq: Once | INTRAVENOUS | Status: AC | PRN
  Administered 2011-08-22: 10 via INTRAVENOUS

## 2011-08-22 NOTE — Discharge Summary (Signed)
Patient ID: [name],  MRN: [id number], DOB/AGE: 02/25/1951 60 y.o.  Admit date: 08/19/2011 Discharge date: 08/22/2011  Primary Cardiologist: Marca Ancona  Discharge Diagnoses Principal Problem:  *Chest pain syndrome Active Problems:  Diabetes mellitus type 2, controlled  Hypertension, benign  Hyperlipemia, mixed  Sinus node dysfunction  Morbid obesity, BMI not known   Allergies Allergies  Allergen Reactions  . Shellfish Allergy Anaphylaxis  . Decadron (Dexamethasone) Nausea And Vomiting  . Erythromycin Itching  . Heparin     Nosebleed  . Iohexol      Desc: pt states reaction is anaphalactic shock from IV dye and any kind of shellfish. pt states she almost died the last time she had a reaction.   Marland Kitchen Keflex Nausea And Vomiting  . Penicillins Itching  . Prednisone Nausea And Vomiting  . Questran Nausea And Vomiting  . Robaxin Itching and Nausea And Vomiting  . Toradol Other (See Comments)    unknown    Procedures  Lexiscan Myoview 08/22/2011 IMPRESSION:  Normal Lexiscan Myoview. No evidence for ischemia or infarction,  normal LV systolic function.   History of Present Illness  60 y/o female with history of recurrent c/p recently d/c from [facility] on 11/8. She represented to [facility] on 12/2 secondary to sscp with radiation to the neck/jaw/left arm assoc with nausea and diaphoresis.  She was admitted for further evaluation.  Hospital Course   Despite prolonged, unrelenting Ss, pt r/o for MI.  We preferred to avoid catheterization given lack of objective evidence of ischemia and known contrast allergy and steroid allergies.  As such, she underwent Lexiscan myoview this am, which showed no evidence of ischemia or infarct.  Pt will be d/c today in good condition.  Discharge Vitals:  Blood pressure 121/74, pulse 69, temperature 97.8 F (36.6 C), temperature source Oral, resp. rate 18, height 5' 0.5" (1.537 m), weight 92.488 kg (203 lb 14.4 oz), SpO2 96.00%.     Labs: CBC:  Basename 08/22/11 0540 08/21/11 0500  WBC 8.8 7.8  NEUTROABS -- --  HGB 10.5* 11.2*  HCT 32.0* 34.5*  MCV 87.9 88.7  PLT 239 201   Basic Metabolic Panel:  Basename 08/21/11 0008  NA 137  K 3.9  CL 101  CO2 25  GLUCOSE 112*  BUN 10  CREATININE 0.80  CALCIUM 8.5  MG --  PHOS --   Cardiac Enzymes:  Basename 08/20/11 1743 08/20/11 1120 08/20/11 0704  CKTOTAL 60 55 52  CKMB 4.2* 4.1* 3.4  CKMBINDEX -- -- --  TROPONINI <0.30 <0.30 <0.30   Fasting Lipid Panel:  Basename 08/21/11 0007  CHOL 218*  HDL 34*  LDLCALC 138*  TRIG 232*  CHOLHDL 6.4  LDLDIRECT --    Disposition:  Follow-up Information    Follow up with HAQUE,IMRAN P on 08/22/2011. (As needed as needed)       Follow up with Marca Ancona, MD. (as scheduled)    Contact information:   [address] [place] [address] [phone]          Discharge Medications: Current Discharge Medication List    CONTINUE these medications which have NOT CHANGED   Details  aspirin 81 MG tablet Take 81 mg by mouth daily.      atenolol (TENORMIN) 25 MG tablet Take 25 mg by mouth daily.      diazepam (VALIUM) 10 MG tablet Take 10 mg by mouth 2 (two) times daily as needed. For anxiety  metFORMIN (GLUCOPHAGE) 850 MG tablet Take 850 mg by mouth 2 (two) times daily with a meal.     nitroGLYCERIN (NITROSTAT) 0.4 MG SL tablet Place 1 tablet (0.4 mg total) under the tongue every 5 (five) minutes as needed for chest pain. Qty: 10 tablet, Refills: 0    ondansetron (ZOFRAN) 4 MG tablet Take 4 mg by mouth every 6 (six) hours as needed. nausea     oxyCODONE (OXY IR/ROXICODONE) 5 MG immediate release tablet Take 5 mg by mouth every 6 (six) hours as needed. pain     pantoprazole (PROTONIX) 40 MG tablet Take 1 tablet (40 mg total) by mouth 2 (two) times daily before a meal. Qty: 60 tablet, Refills: 0    simvastatin (ZOCOR) 40 MG tablet Take 40 mg by  mouth at bedtime.          Outstanding Labs/Studies  None  Duration of Discharge Encounter: Greater than 30 minutes including physician time.  Signed, Nicolasa Ducking NP 08/22/2011, 5:11 PM

## 2011-08-22 NOTE — Progress Notes (Signed)
ANTICOAGULATION CONSULT NOTE - Follow Up Consult  Pharmacy Consult for Heparin Indication: Chest pain/ACS  Allergies  Allergen Reactions  . Shellfish Allergy Anaphylaxis  . Decadron (Dexamethasone) Nausea And Vomiting  . Erythromycin Itching  . Heparin     Nosebleed  . Iohexol      Desc: pt states reaction is anaphalactic shock from IV dye and any kind of shellfish. pt states she almost died the last time she had a reaction.   Marland Kitchen Keflex Nausea And Vomiting  . Penicillins Itching  . Prednisone Nausea And Vomiting  . Questran Nausea And Vomiting  . Robaxin Itching and Nausea And Vomiting  . Toradol Other (See Comments)    unknown    Patient Measurements: Height: 5' 0.5" (153.7 cm) Weight: 203 lb 14.4 oz (92.488 kg) IBW/kg (Calculated) : 46.65  Heparin Dosing Weight: 68.9 kg  Vital Signs: Temp: 98.6 F (37 C) (12/04 0636) Temp src: Oral (12/04 0636) BP: 144/74 mmHg (12/04 0636) Pulse Rate: 70  (12/04 0636)  Labs:  Basename 08/22/11 0540 08/21/11 0500 08/21/11 0008 08/20/11 1743 08/20/11 1120 08/20/11 0704 08/19/11 1655  HGB 10.5* 11.2* -- -- -- -- --  HCT 32.0* 34.5* 34.4* -- -- -- --  PLT 239 201 282 -- -- -- --  APTT -- -- -- -- -- -- --  LABPROT -- -- 13.5 -- -- -- --  INR -- -- 1.01 -- -- -- --  HEPARINUNFRC 0.46 0.49 -- 0.49 -- -- --  CREATININE -- -- 0.80 -- -- -- 0.82  CKTOTAL -- -- -- 60 55 52 --  CKMB -- -- -- 4.2* 4.1* 3.4 --  TROPONINI -- -- -- <0.30 <0.30 <0.30 --   Estimated Creatinine Clearance: 76.7 ml/min (by C-G formula based on Cr of 0.8).   Medications:  Prescriptions prior to admission  Medication Sig Dispense Refill  . aspirin 81 MG tablet Take 81 mg by mouth daily.        Marland Kitchen atenolol (TENORMIN) 25 MG tablet Take 25 mg by mouth daily.        . diazepam (VALIUM) 10 MG tablet Take 10 mg by mouth 2 (two) times daily as needed. For anxiety      . metFORMIN (GLUCOPHAGE) 850 MG tablet Take 850 mg by mouth 2 (two) times daily with a meal.       .  nitroGLYCERIN (NITROSTAT) 0.4 MG SL tablet Place 1 tablet (0.4 mg total) under the tongue every 5 (five) minutes as needed for chest pain.  10 tablet  0  . ondansetron (ZOFRAN) 4 MG tablet Take 4 mg by mouth every 6 (six) hours as needed. nausea       . oxyCODONE (OXY IR/ROXICODONE) 5 MG immediate release tablet Take 5 mg by mouth every 6 (six) hours as needed. pain       . pantoprazole (PROTONIX) 40 MG tablet Take 1 tablet (40 mg total) by mouth 2 (two) times daily before a meal.  60 tablet  0  . simvastatin (ZOCOR) 40 MG tablet Take 40 mg by mouth at bedtime.         Scheduled:     . aspirin  324 mg Oral Pre-Cath  . aspirin  325 mg Oral Daily  . atenolol  25 mg Oral Daily  . clopidogrel  75 mg Oral Q breakfast  . glipiZIDE  5 mg Oral QAC breakfast  . insulin aspart  0-15 Units Subcutaneous TID WC  . insulin aspart  0-5 Units Subcutaneous QHS  .  nitroGLYCERIN  1 inch Topical Q6H  . pantoprazole  40 mg Oral BID AC  . simvastatin  40 mg Oral QHS  . sodium chloride  3 mL Intravenous Q12H    Assessment: 60 y.o. F on heparin for ACS. Heparin level is therapeutic. No bleeding noted. Lexiscan myoview planned for today due to contrast allergy and inability to tolerate steroids.  Goal of Therapy:  Heparin level 0.3-0.7 units/ml   Plan:  1. Continue heparin at current rate of 1300 units/hr (13 ml/hr) 2. Will continue to monitor for any signs/symptoms of bleeding. 3. F/U after [facility], Kellar Westberg Danielle 08/22/2011,8:41 AM

## 2011-08-22 NOTE — Progress Notes (Signed)
Patient ID: [name], female   DOB: 1951/09/05, 60 y.o.   MRN: [id number] [facility]  SUBJECTIVE:Patient continues to have mild chest pain that comes and goes.  She ruled out for MI.  This is the same presentation she has had the prior times she was admitted.   Patient has a contrast allergy and anaphylaxis with shellfish.  We had planned a cath today but both prednisone and dexamethasone make her very ill with vomiting.  Therefore, I am reticent to give her steroids pre-cath.    Filed Vitals:   08/21/11 0411 08/21/11 1006 08/21/11 1350 08/22/11 0636  BP: 98/57 98/70 127/81 144/74  Pulse: 72 72 69 70  Temp: 97.8 F (36.6 C)  96.6 F (35.9 C) 98.6 F (37 C)  TempSrc: Oral  Axillary Oral  Resp: 18  20 20   Height:      Weight: 92.488 kg (203 lb 14.4 oz)     SpO2: 93%  97% 95%   No intake or output data in the 24 hours ending 08/22/11 0801  LABS: Basic Metabolic Panel:  Basename 08/21/11 0008 08/19/11 1655  NA 137 137  K 3.9 3.8  CL 101 101  CO2 25 24  GLUCOSE 112* 103*  BUN 10 8  CREATININE 0.80 0.82  CALCIUM 8.5 9.3  MG -- --  PHOS -- --   Liver Function Tests: No results found for this basename: AST:2,ALT:2,ALKPHOS:2,BILITOT:2,PROT:2,ALBUMIN:2 in the last 72 hours No results found for this basename: LIPASE:2,AMYLASE:2 in the last 72 hours CBC:  Basename 08/22/11 0540 08/21/11 0500 08/19/11 1655  WBC 8.8 7.8 --  NEUTROABS -- -- 6.8  HGB 10.5* 11.2* --  HCT 32.0* 34.5* --  MCV 87.9 88.7 --  PLT 239 201 --   Cardiac Enzymes:  Basename 08/20/11 1743 08/20/11 1120 08/20/11 0704  CKTOTAL 60 55 52  CKMB 4.2* 4.1* 3.4  CKMBINDEX -- -- --  TROPONINI <0.30 <0.30 <0.30      . aspirin  324 mg Oral Pre-Cath  . aspirin  325 mg Oral Daily  . atenolol  25 mg Oral Daily  . clopidogrel  75 mg Oral Q breakfast  . glipiZIDE  5 mg Oral QAC breakfast  . insulin aspart  0-15 Units Subcutaneous TID WC  . insulin aspart  0-5 Units Subcutaneous QHS  .  nitroGLYCERIN  1 inch Topical Q6H  . pantoprazole  40 mg Oral BID AC  . simvastatin  40 mg Oral QHS  . sodium chloride  3 mL Intravenous Q12H  . DISCONTD: metFORMIN  850 mg Oral BID WC    Fasting Lipid Panel:  Basename 08/21/11 0007  CHOL 218*  HDL 34*  LDLCALC 138*  TRIG 232*  CHOLHDL 6.4  LDLDIRECT --   Thyroid Function Tests: No results found for this basename: TSH,T4TOTAL,FREET3,T3FREE,THYROIDAB in the last 72 hours Anemia Panel: No results found for this basename: VITAMINB12,FOLATE,FERRITIN,TIBC,IRON,RETICCTPCT in the last 72 hours  RADIOLOGY: Nm Pulmonary Per & Vent  07/31/2011  *RADIOLOGY REPORT*  Clinical Data:  Chest pain.  Shortness of breath.  Weakness. Pacemaker in place.  NUCLEAR MEDICINE VENTILATION - PERFUSION LUNG SCAN  Technique:  Wash-in, equilibrium, and wash-out phase ventilation images were obtained using Xe-133 gas.  Perfusion images were obtained in multiple projections after intravenous injection of Tc- 53m MAA.  Radiopharmaceuticals:  10 mCi Xe-133 gas and 6 mCi Tc-39m MAA.  Comparison:  Chest radiographic examination 07/30/2011.  Findings:  Ventilation study:  A focal defect in the anterior aspect of the  left hemithorax midportion corresponds to a transvenous pacemaker controller device.  No segmental or lobar defects are evident.  Perfusion study: A focal defect in the anterior aspect of the left hemithorax midportion corresponds to a transvenous pacemaker controller device.  No segmental or lobar defects are evident. A linear area of subsegmental decreased perfusion in the lingula corresponds to an area of atelectasis or fibrosis on the chest radiographic examination.  No ventilation perfusion mismatch is evident comparing anterior and posterior images.  IMPRESSION: No segmental or lobar ventilation or perfusion defects were evident.  No ventilation perfusion mismatch is evident comparing anterior and posterior images of perfusion and ventilation studies.  Subsegmental area of decreased perfusion in the lingula corresponds to the area of atelectasis or fibrosis within the lingula.  Examination is low probability for pulmonary embolism.  Original Report Authenticated By: Crawford Givens, M.D.    PHYSICAL EXAM General: NAD, obese Neck: No JVD, no thyromegaly or thyroid nodule.  Lungs: Clear to auscultation bilaterally with normal respiratory effort. CV: Nondisplaced PMI.  Heart regular S1/S2, no S3/S4, no murmur.  No peripheral edema.  No carotid bruit.   Abdomen: Soft, nontender, no hepatosplenomegaly, no distention.  Neurologic: Alert and oriented x 3.  Psych: Normal affect. Extremities: No clubbing or cyanosis.   ASSESSMENT AND PLAN: 61 yo with DM and pacemaker for symptomatic bradycardia presents again with chest pain.  She has been seen here several times in the past with atypical chest pain and was discharged to followup with her primary cardiologist in [place], but this did not happen.  She returns with prolonged chest pain again and negative cardiac enzymes.  ECG showed LVH with anterior T wave inversions, similar to prior ECGs.  She had a heart cath per report in 2008 that was negative.   - Given her contrast allergy and problems with steroid use, I will arrange a Lexiscan myoview this morning.  If this is abnormal, she will need a cardiac cath tomorrow.  Would premedicate with prednisone as she thinks she can tolerate it (though makes her nauseated).  - Will need higher dose of statin at home, would discharge on atorvastatin 40.   Marca Ancona 08/22/2011 8:01 AM

## 2011-08-23 MED FILL — Regadenoson IV Inj 0.4 MG/5ML (0.08 MG/ML): INTRAVENOUS | Qty: 5 | Status: AC

## 2011-09-26 ENCOUNTER — Emergency Department (HOSPITAL_COMMUNITY)
Admission: EM | Admit: 2011-09-26 | Discharge: 2011-09-27 | Disposition: A | Discharge status: Home / Self Care | Payer: Medicare Other | Attending: Emergency Medicine | Admitting: Emergency Medicine

## 2011-09-26 ENCOUNTER — Emergency Department (HOSPITAL_COMMUNITY): Payer: Medicare Other

## 2011-09-26 ENCOUNTER — Encounter (HOSPITAL_COMMUNITY): Payer: Self-pay

## 2011-09-26 DIAGNOSIS — R1084 Generalized abdominal pain: Secondary | ICD-10-CM | POA: Insufficient documentation

## 2011-09-26 DIAGNOSIS — K529 Noninfective gastroenteritis and colitis, unspecified: Secondary | ICD-10-CM

## 2011-09-26 DIAGNOSIS — Z7982 Long term (current) use of aspirin: Secondary | ICD-10-CM | POA: Insufficient documentation

## 2011-09-26 DIAGNOSIS — K5289 Other specified noninfective gastroenteritis and colitis: Secondary | ICD-10-CM | POA: Insufficient documentation

## 2011-09-26 DIAGNOSIS — Z79899 Other long term (current) drug therapy: Secondary | ICD-10-CM | POA: Insufficient documentation

## 2011-09-26 DIAGNOSIS — I2510 Atherosclerotic heart disease of native coronary artery without angina pectoris: Secondary | ICD-10-CM | POA: Insufficient documentation

## 2011-09-26 DIAGNOSIS — Z8542 Personal history of malignant neoplasm of other parts of uterus: Secondary | ICD-10-CM | POA: Insufficient documentation

## 2011-09-26 DIAGNOSIS — I10 Essential (primary) hypertension: Secondary | ICD-10-CM | POA: Insufficient documentation

## 2011-09-26 DIAGNOSIS — Z950 Presence of cardiac pacemaker: Secondary | ICD-10-CM | POA: Insufficient documentation

## 2011-09-26 DIAGNOSIS — E119 Type 2 diabetes mellitus without complications: Secondary | ICD-10-CM | POA: Insufficient documentation

## 2011-09-26 DIAGNOSIS — M129 Arthropathy, unspecified: Secondary | ICD-10-CM | POA: Insufficient documentation

## 2011-09-26 DIAGNOSIS — R112 Nausea with vomiting, unspecified: Secondary | ICD-10-CM | POA: Insufficient documentation

## 2011-09-26 DIAGNOSIS — E7800 Pure hypercholesterolemia, unspecified: Secondary | ICD-10-CM | POA: Insufficient documentation

## 2011-09-26 DIAGNOSIS — R0789 Other chest pain: Secondary | ICD-10-CM | POA: Insufficient documentation

## 2011-09-26 LAB — CBC
HCT: 43.4 % (ref 36.0–46.0)
Hemoglobin: 14.6 g/dL (ref 12.0–15.0)
MCH: 29.1 pg (ref 26.0–34.0)
MCHC: 33.6 g/dL (ref 30.0–36.0)
RBC: 5.02 MIL/uL (ref 3.87–5.11)

## 2011-09-26 LAB — COMPREHENSIVE METABOLIC PANEL
AST: 31 U/L (ref 0–37)
Albumin: 3.8 g/dL (ref 3.5–5.2)
BUN: 9 mg/dL (ref 6–23)
Creatinine, Ser: 0.66 mg/dL (ref 0.50–1.10)
Total Protein: 8.5 g/dL — ABNORMAL HIGH (ref 6.0–8.3)

## 2011-09-26 LAB — URINE CULTURE
Colony Count: NO GROWTH
Culture: NO GROWTH

## 2011-09-26 LAB — POCT I-STAT TROPONIN I
Troponin i, poc: 0 ng/mL (ref 0.00–0.08)
Troponin i, poc: 0 ng/mL (ref 0.00–0.08)

## 2011-09-26 LAB — DIFFERENTIAL
Basophils Relative: 0 % (ref 0–1)
Eosinophils Absolute: 0.1 10*3/uL (ref 0.0–0.7)
Lymphs Abs: 3.9 10*3/uL (ref 0.7–4.0)
Monocytes Absolute: 0.8 10*3/uL (ref 0.1–1.0)
Monocytes Relative: 6 % (ref 3–12)
Neutrophils Relative %: 64 % (ref 43–77)

## 2011-09-26 LAB — D-DIMER, QUANTITATIVE: D-Dimer, Quant: 0.95 ug/mL-FEU — ABNORMAL HIGH (ref 0.00–0.48)

## 2011-09-26 LAB — GLUCOSE, CAPILLARY: Glucose-Capillary: 245 mg/dL — ABNORMAL HIGH (ref 70–99)

## 2011-09-26 LAB — URINALYSIS, ROUTINE W REFLEX MICROSCOPIC
Bilirubin Urine: NEGATIVE
Glucose, UA: 100 mg/dL — AB
Hgb urine dipstick: NEGATIVE
Protein, ur: NEGATIVE mg/dL
Specific Gravity, Urine: 1.007 (ref 1.005–1.030)

## 2011-09-26 LAB — LIPASE, BLOOD: Lipase: 33 U/L (ref 11–59)

## 2011-09-26 MED ORDER — OXYCODONE-ACETAMINOPHEN 5-325 MG PO TABS
1.0000 | ORAL_TABLET | Freq: Once | ORAL | Status: AC
  Administered 2011-09-26: 1 via ORAL
  Filled 2011-09-26: qty 1

## 2011-09-26 MED ORDER — ACETAMINOPHEN 325 MG PO TABS
650.0000 mg | ORAL_TABLET | Freq: Once | ORAL | Status: AC
Start: 2011-09-26 — End: 2011-09-26
  Administered 2011-09-26: 650 mg via ORAL

## 2011-09-26 MED ORDER — SODIUM CHLORIDE 0.9 % IV BOLUS (SEPSIS)
1000.0000 mL | Freq: Once | INTRAVENOUS | Status: AC
  Administered 2011-09-27: 1000 mL via INTRAVENOUS

## 2011-09-26 MED ORDER — METOCLOPRAMIDE HCL 5 MG/ML IJ SOLN
10.0000 mg | Freq: Once | INTRAMUSCULAR | Status: AC
  Administered 2011-09-26: 10 mg via INTRAVENOUS
  Filled 2011-09-26: qty 2

## 2011-09-26 MED ORDER — ONDANSETRON HCL 4 MG/2ML IJ SOLN
4.0000 mg | Freq: Once | INTRAMUSCULAR | Status: AC
  Administered 2011-09-27: 4 mg via INTRAVENOUS
  Filled 2011-09-26: qty 2

## 2011-09-26 MED ORDER — MORPHINE SULFATE 4 MG/ML IJ SOLN
4.0000 mg | Freq: Once | INTRAMUSCULAR | Status: AC
  Administered 2011-09-26: 4 mg via INTRAVENOUS
  Filled 2011-09-26: qty 1

## 2011-09-26 MED ORDER — ONDANSETRON HCL 4 MG/2ML IJ SOLN
4.0000 mg | Freq: Once | INTRAMUSCULAR | Status: AC
  Administered 2011-09-26: 4 mg via INTRAVENOUS
  Filled 2011-09-26: qty 2

## 2011-09-26 MED ORDER — ACETAMINOPHEN 325 MG PO TABS: 650.0000 mg | ORAL_TABLET | Freq: Once | ORAL | Status: DC

## 2011-09-26 MED ORDER — ACETAMINOPHEN 325 MG PO TABS
ORAL_TABLET | ORAL | Status: AC
  Filled 2011-09-26: qty 2

## 2011-09-26 MED ORDER — LIDOCAINE VISCOUS 2 % MT SOLN
20.0000 mL | Freq: Once | OROMUCOSAL | Status: AC
  Administered 2011-09-26: 20 mL via OROMUCOSAL
  Filled 2011-09-26 (×2): qty 15

## 2011-09-26 MED ORDER — NITROGLYCERIN 2 % TD OINT
1.0000 [in_us] | TOPICAL_OINTMENT | Freq: Once | TRANSDERMAL | Status: AC
  Administered 2011-09-26: 1 [in_us] via TOPICAL
  Filled 2011-09-26: qty 1

## 2011-09-26 MED ORDER — LORAZEPAM 1 MG PO TABS
1.0000 mg | ORAL_TABLET | Freq: Once | ORAL | Status: AC
  Administered 2011-09-26: 1 mg via ORAL
  Filled 2011-09-26: qty 1

## 2011-09-26 MED ORDER — MORPHINE SULFATE 4 MG/ML IJ SOLN
4.0000 mg | Freq: Once | INTRAMUSCULAR | Status: AC
  Administered 2011-09-27: 4 mg via INTRAVENOUS
  Filled 2011-09-26: qty 1

## 2011-09-26 NOTE — ED Notes (Signed)
I&O cath done, no complications.  Clear yellow urine returned and bladder emptied.

## 2011-09-26 NOTE — ED Notes (Signed)
Pt states the picc line has been taken out

## 2011-09-26 NOTE — ED Notes (Signed)
Chest pain for a few day, radiates into her lt. Shoulder and jaw area, also has a headache and reports that her blood sugars have been elevated. Pt. Reports having n/v/d

## 2011-09-26 NOTE — ED Notes (Signed)
Pt states she has had chest pain on and off for three days.  Says pain startes in center and moves to her Jaw and shoulder.  Pt says she came today bc it would not go away like has in past.  Also came today bc her blood sugar isnt being controlled by her metformin

## 2011-09-26 NOTE — ED Notes (Signed)
IV team at [facility], lab remains at [facility], no IV/ lab access at this time, EDP aware, [place] at [facility]. Pt calm, NAD, cooperative, tolerating well.

## 2011-09-26 NOTE — ED Notes (Signed)
Pt has pacemaker ?

## 2011-09-26 NOTE — ED Notes (Addendum)
ASA 325mg  taken by pt PTA, Repeat EKG done, lab at [facility], orders received from EDP initiated, c/o 9/10 CP and HA, ntg paste applied. Pt alert, NAD, calm, interactive, skin W&D, resps e/u, speaking in clear complete sentences, NSR on monitor HR 85, VSS.

## 2011-09-26 NOTE — ED Notes (Signed)
States ativan has been no help, GI meds have  Not helped and her CP remains 8/10.  Lab at bedside drawing stat troponin

## 2011-09-26 NOTE — ED Notes (Signed)
Pt reports n/v/d

## 2011-09-26 NOTE — ED Notes (Signed)
"  No changes, no relief", 9/10 pain. "All meds given so far have not helped any".

## 2011-09-26 NOTE — ED Provider Notes (Signed)
History     CSN: [id number]  Arrival date & time 09/26/11  1422   First MD Initiated Contact with Patient 09/26/11 1753      Chief Complaint  Patient presents with  . Chest Pain    (Consider location/radiation/quality/duration/timing/severity/associated sxs/prior treatment) HPI history from patient Patient is a 61 year old female with history of hypertension diabetes (on metformin) who presents with chest pain. This started about 3-4 days ago. She notes the pain and left-sided chest and does involve the entire anterior chest at times. She does not have significant dyspnea with this but has had intermittent nausea and vomiting. She's had no syncope. With this nausea and vomiting she has had generalized abdominal discomfort evidently. Mild suprapubic discomfort but no significant change in quality of the urine. The chest discomfort is present more often than not. It does not change with exertion or position. No food related changes. Patient has history of uncomplicated cholecystectomy. She has had this chest pain intermittently for the last several months. She has had multiple admissions for this. Most recently she received stress Myoview about one month ago. She also had VQ scan in setting of similar symptoms a month or 2 ago. Overall severity moderate.   Past Medical History  Diagnosis Date  . Hypertension   . High cholesterol   . Pacemaker   . Diabetes mellitus   . Arthritis   . Headache   . PONV (postoperative nausea and vomiting)   . 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 Coronary artery disease   . Anxiety   . Fibromyalgia   . Dysrhythmia     Past Surgical History  Procedure Date  . Abdominal hysterectomy   . Fracture surgery     left ankle  . Joint replacement     right knee  . Cholecystectomy     2005-2006  . Insert / replace / remove pacemaker     2008, [employer], ICD    Family History  Problem Relation Age of Onset  . Coronary artery disease Mother     s/p stenting,  currently 66  . Cancer Father     heavy smoker, died of lung cancer in 37  . Brain cancer Brother     ? CAD    History  Substance Use Topics  . Smoking status: Never Smoker   . Smokeless tobacco: Not on file  . Alcohol Use: No    OB History    Grav Para Term Preterm Abortions TAB SAB Ect Mult Living                  Review of Systems  Constitutional: Negative for fever and chills.  HENT: Negative for facial swelling.   Eyes: Negative for visual disturbance.  Respiratory: Negative for cough, chest tightness, shortness of breath and wheezing.   Cardiovascular: Positive for chest pain.  Gastrointestinal: Positive for nausea and vomiting. Negative for abdominal pain and diarrhea.  Genitourinary: Negative for difficulty urinating.  Skin: Negative for rash.  Neurological: Negative for weakness and numbness.  Psychiatric/Behavioral: Negative for behavioral problems and confusion.  All other systems reviewed and are negative.    Allergies  Shellfish allergy; Decadron; Erythromycin; Heparin; Iohexol; Keflex; Penicillins; Prednisone; Questran; Robaxin; and Toradol  Home Medications   Current Outpatient Rx  Name Route Sig Dispense Refill  . ASPIRIN 81 MG PO TABS Oral Take 81 mg by mouth daily.      . ATENOLOL 25 MG PO TABS Oral Take 25 mg by  mouth daily.      Marland Kitchen DIAZEPAM 10 MG PO TABS Oral Take 10 mg by mouth 2 (two) times daily as needed. For anxiety    . METFORMIN HCL 850 MG PO TABS Oral Take 850 mg by mouth 2 (two) times daily with a meal.     . NITROGLYCERIN 0.4 MG SL SUBL Sublingual Place 0.4 mg under the tongue every 5 (five) minutes as needed. For chest pain.     Marland Kitchen ONDANSETRON HCL 4 MG PO TABS Oral Take 4 mg by mouth every 6 (six) hours as needed. nausea     . OXYCODONE HCL 5 MG PO TABS Oral Take 5 mg by mouth every 6 (six) hours as needed. pain     . PANTOPRAZOLE SODIUM 40 MG PO TBEC Oral Take 40 mg by mouth 2 (two) times daily before a meal.      . SIMVASTATIN 40 MG  PO TABS Oral Take 40 mg by mouth at bedtime.      Marland Kitchen HYDROCODONE-ACETAMINOPHEN 5-500 MG PO TABS Oral Take 1 tablet by mouth every 6 (six) hours as needed for pain. 20 tablet 0  . ONDANSETRON HCL 4 MG PO TABS Oral Take 1 tablet (4 mg total) by mouth every 6 (six) hours. 20 tablet 0    BP 107/57  Pulse 81  Temp(Src) 98.4 F (36.9 C) (Oral)  Resp 16  Ht 5\' 5"  (1.651 m)  Wt 195 lb (88.451 kg)  BMI 32.45 kg/m2  SpO2 96%  LMP 09/26/2011  Physical Exam  Nursing note and vitals reviewed. Constitutional: She is oriented to person, place, and time. She appears well-developed and well-nourished. No distress.  HENT:  Head: Normocephalic.  Nose: Nose normal.  Eyes: EOM are normal.  Neck: Normal range of motion. Neck supple.  Cardiovascular: Normal rate, regular rhythm and intact distal pulses.   No murmur heard. Pulmonary/Chest: Effort normal and breath sounds normal. No respiratory distress. She has no wheezes. She exhibits no tenderness.  Abdominal: Soft. She exhibits no distension. There is tenderness (Mild vague abdominal tenderness to palpation without focal worsening. No rebound or guarding. No peritonitis.).  Musculoskeletal: Normal range of motion. She exhibits no edema.       No lower extremity edema, calf cords or significant tenderness to palpation No clinical findings suggestive of DVT  Neurological: She is alert and oriented to person, place, and time.       Normal strength  Skin: Skin is warm and dry. No rash noted. She is not diaphoretic.  Psychiatric: She has a normal mood and affect. Her behavior is normal. Thought content normal.    ED Course  Procedures (including critical care time)  ECG on 09/26/2011 1439: Heart rate 86. Normal sinus rhythm. Normal axis. No hypertrophy. RV conduction delay. Normal intervals. Diffuse mild nonspecific T wave inversions. No ischemic ST changes. When compared to ECG from 08/21/2011, no significant change. No new ischemic changes.  ECG on  09/26/2011 1439: Heart rate 84. Normal sinus rhythm. Normal axis. No hypertrophy. RV conduction delay. Normal intervals. Diffuse mild nonspecific T wave inversions. No ischemic ST changes. When compared to ECG from 08/21/2011 and from earlier today, no significant change. No new ischemic changes.  Labs Reviewed  GLUCOSE, CAPILLARY - Abnormal; Notable for the following:    Glucose-Capillary 245 (*)    All other components within normal limits  CBC - Abnormal; Notable for the following:    WBC 13.4 (*)    All other components within normal limits  DIFFERENTIAL - Abnormal; Notable for the following:    Neutro Abs 8.6 (*)    All other components within normal limits  URINALYSIS, ROUTINE W REFLEX MICROSCOPIC - Abnormal; Notable for the following:    Glucose, UA 100 (*)    All other components within normal limits  COMPREHENSIVE METABOLIC PANEL - Abnormal; Notable for the following:    Glucose, Bld 143 (*)    Total Protein 8.5 (*)    Alkaline Phosphatase 206 (*)    All other components within normal limits  D-DIMER, QUANTITATIVE - Abnormal; Notable for the following:    D-Dimer, Quant 0.95 (*)    All other components within normal limits  LIPASE, BLOOD  POCT I-STAT TROPONIN I  POCT I-STAT TROPONIN I  POCT CBG MONITORING  I-STAT TROPONIN I  URINE CULTURE  I-STAT TROPONIN I   Dg Chest 2 View  09/26/2011  *RADIOLOGY REPORT*  Clinical Data: Chest pain  CHEST - 2 VIEW  Comparison: 08/20/2011  Findings: Stable left subclavian two lead pacer.  Mild cardiac enlargement without CHF, pneumonia, edema, effusion, collapse, consolidation, pneumothorax.  Minor basilar scarring/atelectasis. Thoracic spondylosis noted with increased kyphotic curvature. Previous cholecystectomy evident.  Stable exam.  IMPRESSION: Stable chest exam.  No superimposed acute process.  Original Report Authenticated By: Judie Petit. Ruel Favors, M.D.     1. Atypical chest pain   2. Gastroenteritis       MDM   Patient here  complaining of chest pain with associated vomiting and nausea and diarrhea. Her chest pain has some typical features but is without typical modifying factors or exertional changes. She has had this same chest pain numerous times before. Most recently (about one month ago) she was admitted for same chest pain and had negative evaluation including stress Myoview and serial troponins. Her EKG here is without acute ischemic changes and her serial troponins in setting of persistent chest pain are negative. After initial evaluation, d-dimer ordered. This was mildly elevated.  However, after further review of patient's records, patient recently (end of 11/12) had VQ scan done to assess this same chest pain. That scan was negative. Patient reports anaphylactic reaction to IV contrast, so cannot obtain CT Angio.  Considering her normal heart rate, normal oxygen requirement, atypical story, and recent VQ scan in setting of same chest pain; no acute need to repeat VQ scan at this time. Patient does note vague GI symptoms including intermittent nausea, vomiting, watery diarrhea. She is able to keep down fluids and clinically appears well-hydrated. Her lab work is unremarkable. She has very mild, vague abdominal discomfort on palpation but has no significant point tenderness palpation. No clinical evidence of serious intra-abdominal infection or other surgical etiology for the symptoms. These symptoms are likely related to an acute viral illness. Overall, patient with improved symptoms after interventions in the ED. She has had extensive workup for her frequent chest pain and there is no evidence today that there is a primary cardiac or pulmonary etiology for this. She overall is well appearing with normal vital signs. Her abdomen is also benign and she is able to tolerate by mouth. We will discharge her home with PCP followup and symptomatic control. We thoroughly discussed the results from today as well as return  precautions.      Milus Glazier 09/27/11 1445

## 2011-09-26 NOTE — ED Notes (Signed)
Back from xray, no changes.  ?

## 2011-09-27 ENCOUNTER — Encounter (HOSPITAL_COMMUNITY): Payer: Self-pay | Admitting: *Deleted

## 2011-09-27 MED ORDER — HYDROCODONE-ACETAMINOPHEN 5-500 MG PO TABS: 1.0000 | ORAL_TABLET | Freq: Four times a day (QID) | ORAL | Status: AC | PRN

## 2011-09-27 MED ORDER — ONDANSETRON HCL 4 MG PO TABS: 4.0000 mg | ORAL_TABLET | Freq: Four times a day (QID) | ORAL | Status: AC

## 2011-09-27 NOTE — ED Provider Notes (Signed)
I saw and evaluated the patient, reviewed the resident's note and I agree with the findings and plan.   Julien Berryman A. Ximenna Fonseca, MD 09/27/11 1454 

## 2011-11-07 ENCOUNTER — Other Ambulatory Visit: Payer: Self-pay

## 2011-11-07 ENCOUNTER — Encounter (HOSPITAL_COMMUNITY): Payer: Self-pay

## 2011-11-07 ENCOUNTER — Emergency Department (HOSPITAL_COMMUNITY)
Admission: EM | Admit: 2011-11-07 | Discharge: 2011-11-08 | Disposition: A | Discharge status: Home / Self Care | Payer: Medicare Other | Attending: Emergency Medicine | Admitting: Emergency Medicine

## 2011-11-07 ENCOUNTER — Emergency Department (HOSPITAL_COMMUNITY): Payer: Medicare Other

## 2011-11-07 DIAGNOSIS — I10 Essential (primary) hypertension: Secondary | ICD-10-CM | POA: Insufficient documentation

## 2011-11-07 DIAGNOSIS — R1013 Epigastric pain: Secondary | ICD-10-CM | POA: Insufficient documentation

## 2011-11-07 DIAGNOSIS — Z794 Long term (current) use of insulin: Secondary | ICD-10-CM | POA: Insufficient documentation

## 2011-11-07 DIAGNOSIS — E7800 Pure hypercholesterolemia, unspecified: Secondary | ICD-10-CM | POA: Insufficient documentation

## 2011-11-07 DIAGNOSIS — IMO0001 Reserved for inherently not codable concepts without codable children: Secondary | ICD-10-CM | POA: Insufficient documentation

## 2011-11-07 DIAGNOSIS — Z96659 Presence of unspecified artificial knee joint: Secondary | ICD-10-CM | POA: Insufficient documentation

## 2011-11-07 DIAGNOSIS — R112 Nausea with vomiting, unspecified: Secondary | ICD-10-CM

## 2011-11-07 DIAGNOSIS — F411 Generalized anxiety disorder: Secondary | ICD-10-CM | POA: Insufficient documentation

## 2011-11-07 DIAGNOSIS — R079 Chest pain, unspecified: Secondary | ICD-10-CM | POA: Insufficient documentation

## 2011-11-07 DIAGNOSIS — Z79899 Other long term (current) drug therapy: Secondary | ICD-10-CM | POA: Insufficient documentation

## 2011-11-07 DIAGNOSIS — E119 Type 2 diabetes mellitus without complications: Secondary | ICD-10-CM | POA: Insufficient documentation

## 2011-11-07 DIAGNOSIS — Z950 Presence of cardiac pacemaker: Secondary | ICD-10-CM | POA: Insufficient documentation

## 2011-11-07 DIAGNOSIS — M129 Arthropathy, unspecified: Secondary | ICD-10-CM | POA: Insufficient documentation

## 2011-11-07 DIAGNOSIS — I2510 Atherosclerotic heart disease of native coronary artery without angina pectoris: Secondary | ICD-10-CM | POA: Insufficient documentation

## 2011-11-07 DIAGNOSIS — Z7982 Long term (current) use of aspirin: Secondary | ICD-10-CM | POA: Insufficient documentation

## 2011-11-07 LAB — PROTIME-INR
INR: 0.97 (ref 0.00–1.49)
Prothrombin Time: 13.1 seconds (ref 11.6–15.2)

## 2011-11-07 LAB — CBC
Hemoglobin: 12.7 g/dL (ref 12.0–15.0)
MCH: 26.7 pg (ref 26.0–34.0)
Platelets: 279 10*3/uL (ref 150–400)
RBC: 4.76 MIL/uL (ref 3.87–5.11)
WBC: 9.7 10*3/uL (ref 4.0–10.5)

## 2011-11-07 LAB — D-DIMER, QUANTITATIVE: D-Dimer, Quant: 0.7 ug/mL-FEU — ABNORMAL HIGH (ref 0.00–0.48)

## 2011-11-07 MED ORDER — SODIUM CHLORIDE 0.9 % IV SOLN
Freq: Once | INTRAVENOUS | Status: AC
  Administered 2011-11-07: 22:00:00 via INTRAVENOUS

## 2011-11-07 MED ORDER — GI COCKTAIL ~~LOC~~
30.0000 mL | Freq: Once | ORAL | Status: AC
  Administered 2011-11-07: 30 mL via ORAL
  Filled 2011-11-07: qty 30

## 2011-11-07 MED ORDER — MORPHINE SULFATE 4 MG/ML IJ SOLN
2.0000 mg | Freq: Once | INTRAMUSCULAR | Status: AC
  Administered 2011-11-07: 2 mg via INTRAVENOUS
  Filled 2011-11-07: qty 1

## 2011-11-07 MED ORDER — ONDANSETRON HCL 4 MG/2ML IJ SOLN
4.0000 mg | Freq: Once | INTRAMUSCULAR | Status: AC
  Administered 2011-11-07: 4 mg via INTRAVENOUS
  Filled 2011-11-07: qty 2

## 2011-11-07 MED ORDER — LORAZEPAM 2 MG/ML IJ SOLN
1.0000 mg | Freq: Once | INTRAMUSCULAR | Status: AC
  Administered 2011-11-07: 1 mg via INTRAVENOUS
  Filled 2011-11-07: qty 1

## 2011-11-07 NOTE — ED Notes (Signed)
Attempted IV x2, paged IV team  

## 2011-11-07 NOTE — ED Notes (Signed)
Pt c/o "intense" chest pain since this am, with n/v. Pt also c/o right sided abd pain. Pt states that her blood sugars have been very elevated recently in spite of taking medication.

## 2011-11-07 NOTE — ED Notes (Signed)
IV team called back, On way.

## 2011-11-07 NOTE — ED Notes (Signed)
Chest pain began this am with radiation into Lt. Arm, jaw  Pt. Is n/v/ and dizzy

## 2011-11-08 LAB — COMPREHENSIVE METABOLIC PANEL
BUN: 6 mg/dL (ref 6–23)
CO2: 21 mEq/L (ref 19–32)
Calcium: 9.1 mg/dL (ref 8.4–10.5)
Chloride: 104 mEq/L (ref 96–112)
Creatinine, Ser: 0.61 mg/dL (ref 0.50–1.10)
GFR calc Af Amer: >90 mL/min (ref >90)
GFR calc non Af Amer: >90 mL/min (ref >90)
Glucose, Bld: 127 mg/dL — ABNORMAL HIGH (ref 70–99)
Total Bilirubin: 0.3 mg/dL (ref 0.3–1.2)

## 2011-11-08 LAB — TROPONIN I: Troponin I: <0.3 ng/mL (ref <0.30)

## 2011-11-08 MED ORDER — ONDANSETRON 4 MG PO TBDP: 4.0000 mg | ORAL_TABLET | Freq: Three times a day (TID) | ORAL | Status: AC | PRN

## 2011-11-08 MED ORDER — ONDANSETRON HCL 4 MG PO TABS: 4.0000 mg | ORAL_TABLET | Freq: Three times a day (TID) | ORAL | Status: DC | PRN

## 2011-11-08 MED ORDER — ONDANSETRON HCL 4 MG/2ML IJ SOLN
4.0000 mg | Freq: Once | INTRAMUSCULAR | Status: AC
  Administered 2011-11-08: 4 mg via INTRAVENOUS
  Filled 2011-11-08: qty 2

## 2011-11-08 MED ORDER — SUCRALFATE 1 GM/10ML PO SUSP: 1.0000 g | Freq: Four times a day (QID) | ORAL | Status: DC | PRN

## 2011-11-08 MED ORDER — MORPHINE SULFATE 4 MG/ML IJ SOLN
2.0000 mg | Freq: Once | INTRAMUSCULAR | Status: AC
  Administered 2011-11-08: 4 mg via INTRAVENOUS
  Filled 2011-11-08: qty 1

## 2011-11-08 MED ORDER — MORPHINE SULFATE 4 MG/ML IJ SOLN
2.0000 mg | Freq: Once | INTRAMUSCULAR | Status: AC
  Administered 2011-11-08: 2 mg via INTRAVENOUS

## 2011-11-08 NOTE — ED Provider Notes (Signed)
History     CSN: [id number]  Arrival date & time 11/07/11  1637   First MD Initiated Contact with Patient 11/07/11 1858      Chief Complaint  Patient presents with  . Chest Pain    HPI The patient presents with chest pain.  Her pain began approximately 12 hours prior to arrival.  She recalls doing nothing in particular when the pain began.  The pain was epigastric, sternal.  The pain was burning, nonradiating.  No clear exertional or pleuritic factors there is mild radiation to the jaw and left arm.  She also endorses multiple episodes of emesis, persistent nausea, mild dizziness.  No clear alleviation with anything, nor any clear worsening factors. Notably, the patient has history of similar pain on multiple prior occasions, including one requiring evaluation here last month. She notes that she has been in her usual state of health, aside from these episodic bursts of chest pain, no new medications, no new activities, diet.  Past Medical History  Diagnosis Date  . Hypertension   . High cholesterol   . Pacemaker   . Diabetes mellitus   . Arthritis   . Headache   . PONV (postoperative nausea and vomiting)   . 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 Coronary artery disease   . Anxiety   . Fibromyalgia   . Dysrhythmia     Past Surgical History  Procedure Date  . Abdominal hysterectomy   . Fracture surgery     left ankle  . Joint replacement     right knee  . Cholecystectomy     2005-2006  . Insert / replace / remove pacemaker     2008, [employer], ICD    Family History  Problem Relation Age of Onset  . Coronary artery disease Mother     s/p stenting, currently 45  . Cancer Father     heavy smoker, died of lung cancer in 69  . Brain cancer Brother     ? CAD    History  Substance Use Topics  . Smoking status: Never Smoker   . Smokeless tobacco: Not on file  . Alcohol Use: No    OB History    Grav Para Term Preterm Abortions TAB SAB Ect Mult Living                    Review of Systems  Constitutional:       HPI  HENT:       HPI otherwise negative  Eyes: Negative.   Respiratory:       HPI, otherwise negative  Cardiovascular:       HPI, otherwise nmegative  Gastrointestinal: Positive for vomiting and diarrhea.  Genitourinary:       HPI, otherwise negative  Musculoskeletal:       HPI, otherwise negative  Skin: Negative.   Neurological: Negative for syncope.    Allergies  Shellfish allergy; Decadron; Erythromycin; Heparin; Iohexol; Keflex; Penicillins; Prednisone; Questran; Robaxin; and Toradol  Home Medications   Current Outpatient Rx  Name Route Sig Dispense Refill  . ASPIRIN 81 MG PO TABS Oral Take 81 mg by mouth daily.      . ATENOLOL 25 MG PO TABS Oral Take 25 mg by mouth daily.      Marland Kitchen DIAZEPAM 10 MG PO TABS Oral Take 10 mg by mouth 2 (two) times daily as needed. For anxiety    . INSULIN GLARGINE 100 UNIT/ML [place] SOLN Subcutaneous Inject 13  Units into the skin at bedtime.    Marland Kitchen METFORMIN HCL 850 MG PO TABS Oral Take 850 mg by mouth 2 (two) times daily with a meal.     . NITROGLYCERIN 0.4 MG SL SUBL Sublingual Place 0.4 mg under the tongue every 5 (five) minutes as needed. For chest pain.     Marland Kitchen PANTOPRAZOLE SODIUM 40 MG PO TBEC Oral Take 40 mg by mouth 2 (two) times daily before a meal.      . SIMVASTATIN 40 MG PO TABS Oral Take 40 mg by mouth at bedtime.        BP 149/78  Pulse 89  Temp(Src) 98.6 F (37 C) (Oral)  Resp 12  Ht 5\' 5"  (1.651 m)  Wt 190 lb (86.183 kg)  BMI 31.62 kg/m2  SpO2 97%  LMP 09/26/2011  Physical Exam  Nursing note and vitals reviewed. Constitutional: She is oriented to person, place, and time. She appears well-developed and well-nourished. No distress.  HENT:  Head: Normocephalic and atraumatic.  Eyes: Conjunctivae and EOM are normal.  Cardiovascular: Normal rate and regular rhythm.   Pulmonary/Chest: Effort normal and breath sounds normal. No stridor. No respiratory distress.  Abdominal: Soft. She  exhibits no distension. There is tenderness in the epigastric area.  Musculoskeletal: She exhibits no edema.  Neurological: She is alert and oriented to person, place, and time. No cranial nerve deficit.  Skin: Skin is warm and dry.  Psychiatric: She has a normal mood and affect.    ED Course  Procedures (including critical care time)  Labs Reviewed  D-DIMER, QUANTITATIVE - Abnormal; Notable for the following:    D-Dimer, Quant 0.70 (*)    All other components within normal limits  CBC  PROTIME-INR  COMPREHENSIVE METABOLIC PANEL  TROPONIN I  LIPASE, BLOOD   Dg Chest 2 View  11/07/2011  *RADIOLOGY REPORT*  Clinical Data: Chest pain  CHEST - 2 VIEW  Comparison: 10/16/2011  Findings: Stable low lung volumes, cardiomegaly and left subclavian two lead pacer.  Negative for CHF or pneumonia.  No effusion or pneumothorax.  Degenerative changes of the spine.  Prior cholecystectomy evident.  IMPRESSION: Cardiomegaly and low lung volumes.  No acute finding.  Stable.  Original Report Authenticated By: Judie Petit. Ruel Favors, M.D.   X-ray reviewed by me   Date: 11/08/2011  Rate: 89  Rhythm: normal sinus rhythm  QRS Axis: normal  Intervals: normal  ST/T Wave abnormalities: nonspecific T wave changes  Conduction Disutrbances:none  Narrative Interpretation:   Old EKG Reviewed: unchanged  ABNORMAL  Post oximetry 100% room air normal cardiac monitor 90, sinus rhythm, normal  No diagnosis found.    MDM  This 61 year old female with multiple medical problems, including episodic chest pain now presents with another episode of chest pain.  The patient's description of epigastric discomfort, nausea, vomiting and diarrhea is suggestive of a GI etiology.  The patient's nonischemic ECG, negative troponin, notable recent evaluation including echocardiogram 2 months ago, V/Q scan 3 months ago is reassuring for the low probability of this being cardiac etiology.  Although the patient has a positive dimer,  she always has a positive dimer, and has been evaluated recently for this with the aforementioned VQ scan.  The patient is not hypoxic, tachypneic, has no lower extremity unilateral swelling, and there is low suspicion for PE.  The patient noted significant improvement in her condition with analgesics.  She is discharged in stable condition to follow up with her primary care physician.  She was  advised to initiate use of a PPI, and to be sure to discuss medication reconciliation with her physician.        Gerhard Munch, MD 11/08/11 [phone]

## 2011-11-08 NOTE — ED Notes (Signed)
Pt received a total of 4mg  morphine

## 2012-01-18 ENCOUNTER — Encounter (HOSPITAL_COMMUNITY): Payer: Self-pay | Admitting: Emergency Medicine

## 2012-01-18 ENCOUNTER — Emergency Department (HOSPITAL_COMMUNITY)
Admission: EM | Admit: 2012-01-18 | Discharge: 2012-01-18 | Disposition: A | Discharge status: Home / Self Care | Payer: Medicare Other | Attending: Emergency Medicine | Admitting: Emergency Medicine

## 2012-01-18 ENCOUNTER — Emergency Department (HOSPITAL_COMMUNITY): Payer: Medicare Other

## 2012-01-18 DIAGNOSIS — R072 Precordial pain: Secondary | ICD-10-CM | POA: Insufficient documentation

## 2012-01-18 DIAGNOSIS — I2510 Atherosclerotic heart disease of native coronary artery without angina pectoris: Secondary | ICD-10-CM | POA: Insufficient documentation

## 2012-01-18 DIAGNOSIS — Z794 Long term (current) use of insulin: Secondary | ICD-10-CM | POA: Insufficient documentation

## 2012-01-18 DIAGNOSIS — R61 Generalized hyperhidrosis: Secondary | ICD-10-CM | POA: Insufficient documentation

## 2012-01-18 DIAGNOSIS — E119 Type 2 diabetes mellitus without complications: Secondary | ICD-10-CM | POA: Insufficient documentation

## 2012-01-18 DIAGNOSIS — Z950 Presence of cardiac pacemaker: Secondary | ICD-10-CM | POA: Insufficient documentation

## 2012-01-18 DIAGNOSIS — IMO0001 Reserved for inherently not codable concepts without codable children: Secondary | ICD-10-CM | POA: Insufficient documentation

## 2012-01-18 DIAGNOSIS — I10 Essential (primary) hypertension: Secondary | ICD-10-CM | POA: Insufficient documentation

## 2012-01-18 DIAGNOSIS — R1013 Epigastric pain: Secondary | ICD-10-CM | POA: Insufficient documentation

## 2012-01-18 DIAGNOSIS — R112 Nausea with vomiting, unspecified: Secondary | ICD-10-CM | POA: Insufficient documentation

## 2012-01-18 LAB — COMPREHENSIVE METABOLIC PANEL
ALT: 11 U/L (ref 0–35)
Calcium: 9.1 mg/dL (ref 8.4–10.5)
GFR calc Af Amer: >90 mL/min (ref >90)
Glucose, Bld: 234 mg/dL — ABNORMAL HIGH (ref 70–99)
Sodium: 138 mEq/L (ref 135–145)
Total Protein: 7.8 g/dL (ref 6.0–8.3)

## 2012-01-18 LAB — CBC
MCH: 27.7 pg (ref 26.0–34.0)
MCHC: 33.2 g/dL (ref 30.0–36.0)
MCV: 83.6 fL (ref 78.0–100.0)
Platelets: 333 10*3/uL (ref 150–400)
RDW: 14.6 % (ref 11.5–15.5)

## 2012-01-18 LAB — DIFFERENTIAL
Basophils Absolute: 0 10*3/uL (ref 0.0–0.1)
Eosinophils Absolute: 0.1 10*3/uL (ref 0.0–0.7)
Eosinophils Relative: 1 % (ref 0–5)
Lymphs Abs: 2.4 10*3/uL (ref 0.7–4.0)

## 2012-01-18 LAB — TROPONIN I: Troponin I: <0.3 ng/mL (ref <0.30)

## 2012-01-18 MED ORDER — ONDANSETRON 4 MG PO TBDP
8.0000 mg | ORAL_TABLET | Freq: Once | ORAL | Status: AC
  Administered 2012-01-18: 8 mg via ORAL

## 2012-01-18 MED ORDER — HYDROCODONE-ACETAMINOPHEN 5-325 MG PO TABS
2.0000 | ORAL_TABLET | Freq: Once | ORAL | Status: AC
  Administered 2012-01-18: 2 via ORAL
  Filled 2012-01-18: qty 2

## 2012-01-18 MED ORDER — ASPIRIN 81 MG PO TABS: 325.0000 mg | ORAL_TABLET | Freq: Once | ORAL | Status: DC

## 2012-01-18 MED ORDER — ONDANSETRON 4 MG PO TBDP
4.0000 mg | ORAL_TABLET | Freq: Once | ORAL | Status: AC
  Administered 2012-01-18: 4 mg via ORAL
  Filled 2012-01-18: qty 1

## 2012-01-18 MED ORDER — ONDANSETRON 8 MG PO TBDP: 8.0000 mg | ORAL_TABLET | Freq: Three times a day (TID) | ORAL | Status: AC | PRN

## 2012-01-18 MED ORDER — ONDANSETRON HCL 4 MG/2ML IJ SOLN: 4.0000 mg | Freq: Once | INTRAMUSCULAR | Status: DC

## 2012-01-18 MED ORDER — ONDANSETRON 4 MG PO TBDP
ORAL_TABLET | ORAL | Status: AC
  Filled 2012-01-18: qty 2

## 2012-01-18 MED ORDER — ALUM & MAG HYDROXIDE-SIMETH 200-200-20 MG/5ML PO SUSP
30.0000 mL | Freq: Once | ORAL | Status: AC
  Administered 2012-01-18: 30 mL via ORAL
  Filled 2012-01-18: qty 30

## 2012-01-18 MED ORDER — ASPIRIN 325 MG PO TABS
325.0000 mg | ORAL_TABLET | Freq: Once | ORAL | Status: AC
  Administered 2012-01-18: 325 mg via ORAL
  Filled 2012-01-18 (×2): qty 1

## 2012-01-18 MED ORDER — TRAMADOL HCL 50 MG PO TABS
50.0000 mg | ORAL_TABLET | Freq: Once | ORAL | Status: AC
  Administered 2012-01-18: 50 mg via ORAL
  Filled 2012-01-18: qty 1

## 2012-01-18 MED ORDER — TRAMADOL HCL 50 MG PO TABS: 50.0000 mg | ORAL_TABLET | Freq: Four times a day (QID) | ORAL | Status: AC | PRN

## 2012-01-18 MED ORDER — LIDOCAINE VISCOUS 2 % MT SOLN
20.0000 mL | Freq: Once | OROMUCOSAL | Status: AC
  Administered 2012-01-18: 20 mL via OROMUCOSAL
  Filled 2012-01-18: qty 15

## 2012-01-18 NOTE — ED Notes (Signed)
States has vomited x 5 and then she tried to eat and when she vomits her chest hurts and her neck hurts took 5 nitro after vomiting but it did not help

## 2012-01-18 NOTE — Discharge Instructions (Signed)
Abdominal Pain Abdominal pain can be caused by many things. Your caregiver decides the seriousness of your pain by an examination and possibly blood tests and X-rays. Many cases can be observed and treated at home. Most abdominal pain is not caused by a disease and will probably improve without treatment. However, in many cases, more time must pass before a clear cause of the pain can be found. Before that point, it may not be known if you need more testing, or if hospitalization or surgery is needed. HOME CARE INSTRUCTIONS   Do not take laxatives unless directed by your caregiver.   Take pain medicine only as directed by your caregiver.   Only take over-the-counter or prescription medicines for pain, discomfort, or fever as directed by your caregiver.   Try a clear liquid diet (broth, tea, or water) for as long as directed by your caregiver. Slowly move to a bland diet as tolerated.  SEEK IMMEDIATE MEDICAL CARE IF:   The pain does not go away.   You have a fever.   You keep throwing up (vomiting).   The pain is felt only in portions of the abdomen. Pain in the right side could possibly be appendicitis. In an adult, pain in the left lower portion of the abdomen could be colitis or diverticulitis.   You pass bloody or black tarry stools.  MAKE SURE YOU:   Understand these instructions.   Will watch your condition.   Will get help right away if you are not doing well or get worse.  Document Released: 06/14/2005 Document Revised: 08/24/2011 Document Reviewed: 04/22/2008 [employer] Patient Information 2012 [employer].Chest Pain (Nonspecific) It is often hard to give a specific diagnosis for the cause of chest pain. There is always a chance that your pain could be related to something serious, such as a heart attack or a blood clot in the lungs. You need to follow up with your caregiver for further evaluation. CAUSES   Heartburn.   Pneumonia or bronchitis.   Anxiety or stress.    Inflammation around your heart (pericarditis) or lung (pleuritis or pleurisy).   A blood clot in the lung.   A collapsed lung (pneumothorax). It can develop suddenly on its own (spontaneous pneumothorax) or from injury (trauma) to the chest.   Shingles infection (herpes zoster virus).  The chest wall is composed of bones, muscles, and cartilage. Any of these can be the source of the pain.  The bones can be bruised by injury.   The muscles or cartilage can be strained by coughing or overwork.   The cartilage can be affected by inflammation and become sore (costochondritis).  DIAGNOSIS  Lab tests or other studies, such as X-rays, electrocardiography, stress testing, or cardiac imaging, may be needed to find the cause of your pain.  TREATMENT   Treatment depends on what may be causing your chest pain. Treatment may include:   Acid blockers for heartburn.   Anti-inflammatory medicine.   Pain medicine for inflammatory conditions.   Antibiotics if an infection is present.   You may be advised to change lifestyle habits. This includes stopping smoking and avoiding alcohol, caffeine, and chocolate.   You may be advised to keep your head raised (elevated) when sleeping. This reduces the chance of acid going backward from your stomach into your esophagus.   Most of the time, nonspecific chest pain will improve within 2 to 3 days with rest and mild pain medicine.  HOME CARE INSTRUCTIONS   If antibiotics   were prescribed, take your antibiotics as directed. Finish them even if you start to feel better.   For the next few days, avoid physical activities that bring on chest pain. Continue physical activities as directed.   Do not smoke.   Avoid drinking alcohol.   Only take over-the-counter or prescription medicine for pain, discomfort, or fever as directed by your caregiver.   Follow your caregiver's suggestions for further testing if your chest pain does not go away.   Keep any  follow-up appointments you made. If you do not go to an appointment, you could develop lasting (chronic) problems with pain. If there is any problem keeping an appointment, you must call to reschedule.  SEEK MEDICAL CARE IF:   You think you are having problems from the medicine you are taking. Read your medicine instructions carefully.   Your chest pain does not go away, even after treatment.   You develop a rash with blisters on your chest.  SEEK IMMEDIATE MEDICAL CARE IF:   You have increased chest pain or pain that spreads to your arm, neck, jaw, back, or abdomen.   You develop shortness of breath, an increasing cough, or you are coughing up blood.   You have severe back or abdominal pain, feel nauseous, or vomit.   You develop severe weakness, fainting, or chills.   You have a fever.  THIS IS AN EMERGENCY. Do not wait to see if the pain will go away. Get medical help at once. Call your local emergency services (911 in [place] Do not drive yourself to the hospital. MAKE SURE YOU:   Understand these instructions.   Will watch your condition.   Will get help right away if you are not doing well or get worse.  Document Released: 06/14/2005 Document Revised: 08/24/2011 Document Reviewed: 04/09/2008 [employer] Patient Information 2012 [employer]. 

## 2012-01-18 NOTE — ED Provider Notes (Signed)
History     CSN: [id number]  Arrival date & time 01/18/12  1210   First MD Initiated Contact with Patient 01/18/12 1606      Chief Complaint  Patient presents with  . Abdominal Pain    (Consider location/radiation/quality/duration/timing/severity/associated sxs/prior treatment) HPI history of present illness chief complaint: Chest pain abdominal pain nausea vomiting. Patient arrived by private vehicle. History provided by patient. Information not limited. Onset of symptoms 14 hours ago. Location of pain substernal and epigastric area. Symptoms mildly worsened with food not improved by anything. Quality is dull. Radiation is to neck and shoulders. Severity is mild to moderate. Timing is constant. Duration is 14 hours. Context the patient was awakened from sleep approximately 1 AM with this pain which progressed to nausea/vomiting. For associated signs or symptoms please refer to the review of systems. Pt took one nitroglycerin without relief. No recent medical care. Regarding patient's social history please refer to the nurse's list. Patent denies any alcohol or nonsteroidal use. I have reviewed the patient's past medical, past surgical, social history, as well as medications and allergies. Family history of pulmonary embolism.  Past Medical History  Diagnosis Date  . Hypertension   . High cholesterol   . Pacemaker   . Diabetes mellitus   . Arthritis   . Headache   . PONV (postoperative nausea and vomiting)   . 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 Coronary artery disease   . Anxiety   . Fibromyalgia   . Dysrhythmia     Past Surgical History  Procedure Date  . Abdominal hysterectomy   . Fracture surgery     left ankle  . Joint replacement     right knee  . Cholecystectomy     2005-2006  . Insert / replace / remove pacemaker     2008, [employer], ICD    Family History  Problem Relation Age of Onset  . Coronary artery disease Mother     s/p stenting, currently 71  . Cancer  Father     heavy smoker, died of lung cancer in 16  . Brain cancer Brother     ? CAD    History  Substance Use Topics  . Smoking status: Never Smoker   . Smokeless tobacco: Not on file  . Alcohol Use: No    OB History    Grav Para Term Preterm Abortions TAB SAB Ect Mult Living                  Review of Systems  Constitutional: Negative for fever and chills.  HENT: Negative for trouble swallowing, neck pain and neck stiffness.   Eyes: Negative for pain, discharge and itching.  Respiratory: Negative for cough, chest tightness and shortness of breath.   Cardiovascular: Positive for chest pain. Negative for palpitations and leg swelling.  Gastrointestinal: Positive for nausea, vomiting and abdominal pain. Negative for diarrhea, constipation, blood in stool and abdominal distention.  Genitourinary: Negative for dysuria, urgency, frequency, hematuria, flank pain, decreased urine volume, difficulty urinating and pelvic pain.  Musculoskeletal: Negative for back pain and joint swelling.  Skin: Negative for rash and wound.  Neurological: Negative for dizziness, tremors, seizures, syncope, facial asymmetry, speech difficulty, weakness, light-headedness, numbness and headaches.  Hematological: Negative for adenopathy. Does not bruise/bleed easily.  Psychiatric/Behavioral: Negative for confusion and decreased concentration.    Allergies  Shellfish allergy; Cephalexin; Decadron; Erythromycin; Heparin; Iohexol; Ketorolac tromethamine; Methocarbamol; Penicillins; Prednisone; and Questran  Home Medications   Current  Outpatient Rx  Name Route Sig Dispense Refill  . ASPIRIN 81 MG PO TABS Oral Take 81 mg by mouth daily.      . ATENOLOL 25 MG PO TABS Oral Take 25 mg by mouth daily.      Marland Kitchen DIAZEPAM 10 MG PO TABS Oral Take 10 mg by mouth 2 (two) times daily as needed. For anxiety    . INSULIN GLARGINE 100 UNIT/ML [place] SOLN Subcutaneous Inject 14 Units into the skin at bedtime.     Marland Kitchen  LISINOPRIL-HYDROCHLOROTHIAZIDE 10-12.5 MG PO TABS Oral Take 1 tablet by mouth daily.    Marland Kitchen METFORMIN HCL 850 MG PO TABS Oral Take 850 mg by mouth 2 (two) times daily with a meal.     . NITROGLYCERIN 0.4 MG SL SUBL Sublingual Place 0.4 mg under the tongue every 5 (five) minutes as needed. For chest pain.     Marland Kitchen PANTOPRAZOLE SODIUM 40 MG PO TBEC Oral Take 40 mg by mouth 2 (two) times daily before a meal.      . SIMVASTATIN 40 MG PO TABS Oral Take 40 mg by mouth at bedtime.      . SUCRALFATE 1 GM/10ML PO SUSP Oral Take 10 mLs (1 g total) by mouth every 6 (six) hours as needed. 420 mL 0    BP 148/81  Pulse 80  Temp(Src) 98.1 F (36.7 C) (Oral)  Resp 15  SpO2 98%  LMP 09/26/2011  Physical Exam  Constitutional: She is oriented to person, place, and time. She appears well-developed and well-nourished. No distress.  HENT:  Head: Normocephalic and atraumatic.  Eyes: Conjunctivae are normal. Right eye exhibits no discharge. Left eye exhibits no discharge. No scleral icterus.  Neck: Normal range of motion. Neck supple. No JVD present.  Cardiovascular: Normal rate, regular rhythm, normal heart sounds and intact distal pulses.   No murmur heard. Pulmonary/Chest: Effort normal and breath sounds normal. No respiratory distress. She has no wheezes. She has no rales. She exhibits tenderness and bony tenderness. She exhibits no mass, no laceration, no crepitus, no edema, no deformity and no swelling.    Abdominal: Soft. Bowel sounds are normal. She exhibits no distension and no mass. There is no hepatosplenomegaly. There is tenderness in the epigastric area. There is no rigidity, no rebound, no guarding, no CVA tenderness, no tenderness at McBurney's point and negative Murphy's sign. No hernia.    Musculoskeletal: Normal range of motion. She exhibits no tenderness.       Trace bilat LE edema   Neurological: She is alert and oriented to person, place, and time.  Skin: Skin is warm and dry. She is not  diaphoretic.  Psychiatric: She has a normal mood and affect.    ED Course  Procedures (including critical care time)  Labs Reviewed  COMPREHENSIVE METABOLIC PANEL - Abnormal; Notable for the following:    Glucose, Bld 234 (*)    Alkaline Phosphatase 178 (*)    Total Bilirubin 0.2 (*)    All other components within normal limits  CBC  DIFFERENTIAL  TROPONIN I   Dg Chest 2 View  01/18/2012  *RADIOLOGY REPORT*  Clinical Data: Chest and abdominal pain.  CHEST - 2 VIEW  Comparison: 11/07/2011  Findings: Heart size is stable and within normal limits.  Dual lead transvenous pacemaker remains in appropriate position.  No evidence of congestive heart failure.  Mild elevation of right hemidiaphragm is stable.  Both lungs are clear.  No evidence of pleural effusion.  No mass  or lymphadenopathy identified.  No significant changes seen compared to prior exam.  IMPRESSION: Stable exam.  No active disease.  Original Report Authenticated By: Danae Orleans, M.D.     1. Chest pain   2. Epigastric abdominal pain       Date: 01/19/2012  Rate: 87  Rhythm: normal sinus rhythm  QRS Axis: normal  Intervals: normal  ST/T Wave abnormalities: nonspecific T wave changes  Conduction Disutrbances:none  Narrative Interpretation:   Old EKG Reviewed: unchanged   MDM  Patient is a well-appearing 62 year old Caucasian female who reports a history of a heart catheterization 5 years ago but it was negative who presents with 14 hours of substernal and epigastric pain that woke her from her sleep. Patient also has mild diaphoresis. Pain radiates and is very reproducible but no pleuritic. Patient did take sublingual nitroglycerin at home without relief. Vomiting is nonbilious nonbloody. No risk factors for gastritis specifically no nonsteroidal use no steroids no recent bad food exposure no alcohol use no history of foreign travel. Chest pain is very reproducible on exam. Patient denies recent trauma or falls or heavy  lifting. EKG shows sinus rhythm with diffuse T wave inversion similar to prior. Chest x-ray today is unremarkable. Labs thus far unremarkable as well. GI cocktail ordered. Patient was given aspirin. Abd exam benign so no plan for abdominal imaging.   Patient was observed for a period time in the emergency department with improved pain with pain medication. A GI cocktail mildly improved patient's symptoms. The patient had a second troponin which was also negative. I did discuss this patient with cardiologist on call for the [facility] cardiology service and he agreed with our plan to check a second troponin and have patient followup in clinic this week. They did agree to flag the patient for appointment. We do not feel the patient's pain today is secondary to cardiac ischemia as evidenced by unremarkable EKG, normal lab values and a normal Myoview MRI pharmacologic stress test 5 months ago. The patient's likelihood of having acute coronary syndrome is felt to be extremely low at this time. Clinical picture is also doubtful for pulmonary embolism, dissection, pneumonia, anaphylaxis. Pt d/c home in stable and improved condition.         Consuello Masse, MD 01/19/12 [phone]

## 2012-01-19 NOTE — ED Provider Notes (Signed)
I saw and evaluated the patient, reviewed the resident's note and I agree with the findings and plan.  Chest and epigastric pain that is constant and woke from sleep.  Associated with nausea and vomiting.  Reproducible sternal pain and epigastric pain.  LHC negative by patient report 5 years ago.  Negative myoview Dec 2012.  Glynn Octave, MD 01/19/12 [phone]

## 2012-02-02 NOTE — Progress Notes (Signed)
Patient already has a cardiologists established.

## 2012-03-17 ENCOUNTER — Encounter (HOSPITAL_COMMUNITY): Payer: Self-pay | Admitting: Emergency Medicine

## 2012-03-17 ENCOUNTER — Inpatient Hospital Stay (HOSPITAL_COMMUNITY)
Admission: EM | Admit: 2012-03-17 | Discharge: 2012-03-20 | DRG: 074 | Disposition: A | Discharge status: Home / Self Care | Payer: Medicare Other | Attending: Internal Medicine | Admitting: Internal Medicine

## 2012-03-17 ENCOUNTER — Emergency Department (HOSPITAL_COMMUNITY): Payer: Medicare Other

## 2012-03-17 DIAGNOSIS — E871 Hypo-osmolality and hyponatremia: Secondary | ICD-10-CM | POA: Diagnosis present

## 2012-03-17 DIAGNOSIS — R079 Chest pain, unspecified: Secondary | ICD-10-CM | POA: Diagnosis present

## 2012-03-17 DIAGNOSIS — Z950 Presence of cardiac pacemaker: Secondary | ICD-10-CM

## 2012-03-17 DIAGNOSIS — F329 Major depressive disorder, single episode, unspecified: Secondary | ICD-10-CM | POA: Diagnosis present

## 2012-03-17 DIAGNOSIS — F419 Anxiety disorder, unspecified: Secondary | ICD-10-CM

## 2012-03-17 DIAGNOSIS — E782 Mixed hyperlipidemia: Secondary | ICD-10-CM

## 2012-03-17 DIAGNOSIS — R197 Diarrhea, unspecified: Secondary | ICD-10-CM

## 2012-03-17 DIAGNOSIS — E1165 Type 2 diabetes mellitus with hyperglycemia: Secondary | ICD-10-CM

## 2012-03-17 DIAGNOSIS — Z96659 Presence of unspecified artificial knee joint: Secondary | ICD-10-CM

## 2012-03-17 DIAGNOSIS — R112 Nausea with vomiting, unspecified: Secondary | ICD-10-CM | POA: Diagnosis present

## 2012-03-17 DIAGNOSIS — E785 Hyperlipidemia, unspecified: Secondary | ICD-10-CM | POA: Diagnosis present

## 2012-03-17 DIAGNOSIS — M797 Fibromyalgia: Secondary | ICD-10-CM | POA: Diagnosis present

## 2012-03-17 DIAGNOSIS — R109 Unspecified abdominal pain: Secondary | ICD-10-CM

## 2012-03-17 DIAGNOSIS — E1149 Type 2 diabetes mellitus with other diabetic neurological complication: Principal | ICD-10-CM | POA: Diagnosis present

## 2012-03-17 DIAGNOSIS — K3184 Gastroparesis: Secondary | ICD-10-CM | POA: Diagnosis present

## 2012-03-17 DIAGNOSIS — I495 Sick sinus syndrome: Secondary | ICD-10-CM | POA: Diagnosis present

## 2012-03-17 DIAGNOSIS — M129 Arthropathy, unspecified: Secondary | ICD-10-CM | POA: Diagnosis present

## 2012-03-17 DIAGNOSIS — K521 Toxic gastroenteritis and colitis: Secondary | ICD-10-CM

## 2012-03-17 DIAGNOSIS — I10 Essential (primary) hypertension: Secondary | ICD-10-CM | POA: Diagnosis present

## 2012-03-17 DIAGNOSIS — E119 Type 2 diabetes mellitus without complications: Secondary | ICD-10-CM

## 2012-03-17 DIAGNOSIS — I249 Acute ischemic heart disease, unspecified: Secondary | ICD-10-CM

## 2012-03-17 DIAGNOSIS — IMO0001 Reserved for inherently not codable concepts without codable children: Secondary | ICD-10-CM | POA: Diagnosis present

## 2012-03-17 DIAGNOSIS — I2510 Atherosclerotic heart disease of native coronary artery without angina pectoris: Secondary | ICD-10-CM | POA: Diagnosis present

## 2012-03-17 LAB — BASIC METABOLIC PANEL
CO2: 19 mEq/L (ref 19–32)
Chloride: 95 mEq/L — ABNORMAL LOW (ref 96–112)
Potassium: 3.7 mEq/L (ref 3.5–5.1)
Sodium: 131 mEq/L — ABNORMAL LOW (ref 135–145)

## 2012-03-17 LAB — URINALYSIS, ROUTINE W REFLEX MICROSCOPIC
Bilirubin Urine: NEGATIVE
Hgb urine dipstick: NEGATIVE
Ketones, ur: NEGATIVE mg/dL
Nitrite: NEGATIVE
Urobilinogen, UA: 0.2 mg/dL (ref 0.0–1.0)

## 2012-03-17 LAB — CBC WITH DIFFERENTIAL/PLATELET
Basophils Absolute: 0 10*3/uL (ref 0.0–0.1)
HCT: 39.5 % (ref 36.0–46.0)
Lymphocytes Relative: 24 % (ref 12–46)
Monocytes Absolute: 0.6 10*3/uL (ref 0.1–1.0)
Neutro Abs: 7.9 10*3/uL — ABNORMAL HIGH (ref 1.7–7.7)
Platelets: 238 10*3/uL (ref 150–400)
RDW: 14.4 % (ref 11.5–15.5)
WBC: 11.2 10*3/uL — ABNORMAL HIGH (ref 4.0–10.5)

## 2012-03-17 LAB — HEPATIC FUNCTION PANEL
Alkaline Phosphatase: 210 U/L — ABNORMAL HIGH (ref 39–117)
Total Bilirubin: 0.2 mg/dL — ABNORMAL LOW (ref 0.3–1.2)

## 2012-03-17 LAB — LIPASE, BLOOD: Lipase: 30 U/L (ref 11–59)

## 2012-03-17 LAB — GLUCOSE, CAPILLARY
Glucose-Capillary: 329 mg/dL — ABNORMAL HIGH (ref 70–99)
Glucose-Capillary: 184 mg/dL — ABNORMAL HIGH (ref 70–99)

## 2012-03-17 LAB — URINE MICROSCOPIC-ADD ON

## 2012-03-17 LAB — POCT I-STAT TROPONIN I: Troponin i, poc: 0 ng/mL (ref 0.00–0.08)

## 2012-03-17 MED ORDER — MORPHINE SULFATE 4 MG/ML IJ SOLN
4.0000 mg | Freq: Once | INTRAMUSCULAR | Status: AC
  Administered 2012-03-17: 4 mg via INTRAVENOUS
  Filled 2012-03-17: qty 1

## 2012-03-17 MED ORDER — ASPIRIN 81 MG PO CHEW
CHEWABLE_TABLET | ORAL | Status: AC
  Administered 2012-03-17: 324 mg via ORAL
  Filled 2012-03-17: qty 4

## 2012-03-17 MED ORDER — ASPIRIN 81 MG PO CHEW
324.0000 mg | CHEWABLE_TABLET | Freq: Once | ORAL | Status: AC
  Administered 2012-03-17: 324 mg via ORAL

## 2012-03-17 MED ORDER — ONDANSETRON HCL 4 MG/2ML IJ SOLN
4.0000 mg | Freq: Once | INTRAMUSCULAR | Status: AC
  Administered 2012-03-17: 4 mg via INTRAVENOUS
  Filled 2012-03-17: qty 2

## 2012-03-17 MED ORDER — NITROGLYCERIN 0.4 MG SL SUBL
0.4000 mg | SUBLINGUAL_TABLET | SUBLINGUAL | Status: DC | PRN
  Administered 2012-03-17: 0.4 mg via SUBLINGUAL
  Filled 2012-03-17: qty 25

## 2012-03-17 MED ORDER — NITROGLYCERIN 2 % TD OINT
0.5000 [in_us] | TOPICAL_OINTMENT | Freq: Four times a day (QID) | TRANSDERMAL | Status: DC
  Administered 2012-03-17 – 2012-03-18 (×2): 0.5 [in_us] via TOPICAL
  Filled 2012-03-17: qty 30
  Filled 2012-03-17: qty 1

## 2012-03-17 MED ORDER — ONDANSETRON HCL 4 MG/2ML IJ SOLN
INTRAMUSCULAR | Status: AC
  Administered 2012-03-17: 4 mg
  Filled 2012-03-17: qty 2

## 2012-03-17 MED ORDER — SODIUM CHLORIDE 0.9 % IV BOLUS (SEPSIS)
1000.0000 mL | Freq: Once | INTRAVENOUS | Status: AC
  Administered 2012-03-17: 1000 mL via INTRAVENOUS

## 2012-03-17 NOTE — ED Notes (Signed)
Patient currently sitting up in bed; no respiratory or acute distress noted.  Patient updated on plan of care; informed patient that we are currently waiting on further orders/disposition from EDP.  Patient has no other questions or concerns at this time; will continue to monitor.

## 2012-03-17 NOTE — ED Provider Notes (Signed)
History     CSN: [id number]  Arrival date & time 03/17/12  1547   First MD Initiated Contact with Patient 03/17/12 1645      Chief Complaint  Patient presents with  . Chest Pain  . Hyperglycemia    (Consider location/radiation/quality/duration/timing/severity/associated sxs/prior treatment) HPI  H/o IDDM, CAD pw chest pain which began 0930 this morning. Has been constant since that time. Describes as left sided with radiation to left jaw and left shoulder. Worse with deep breathing, worse with movement. Took 325 asa and 2 ntg without relief. Worse with exertion. C/O generalized weakness. +Nausea and NBNB emesis. +Shortness of breath. Denies h/o VTE in self or family. No recent hosp/surg/immob. H/o uterine ca at age 76. Denies exogenous hormone use, no leg pain or swelling.  Also with diarrhea x 4 days--7-8 times per day. Recent abx for yeast infection, UTI-bactrim. Has been taking flagyl for diarrhea which was prescribed last week, ALso tried immodium AD without relief. Min lower abdominal cramping with defecation only. No blood in stool. Denies hematuria/dysuria/freq/urgency  ED Notes, ED Provider Notes from 03/17/12 0000 to 03/17/12 16:00:43       Keshia Tommy Rainwater, RN 03/17/2012 15:58      Pt reports center chest pain radiating to left side of jaw and shoulder. Pt also reports that her blood sugar was >400 at home. Pt recently being treated for UTI and yeast infection. Chest pain accompained by shortness if breath, diaphoresis, N/V. Skin cool, clammy to touch   Past Medical History  Diagnosis Date  . Hypertension   . High cholesterol   . Pacemaker   . Diabetes mellitus   . Arthritis   . Headache   . PONV (postoperative nausea and vomiting)   . Coronary artery disease   . Anxiety   . Fibromyalgia   . Dysrhythmia   . 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 Abdominal hysterectomy   . Joint replacement     right knee  . Cholecystectomy    2005-2006  . Insert / replace / remove pacemaker     2008, [employer], ICD  . Fracture surgery     left ankle    Family History  Problem Relation Age of Onset  . Coronary artery disease Mother     s/p stenting, currently 3  . Cancer Father     heavy smoker, died of lung cancer in 27  . Brain cancer Brother     ? CAD    History  Substance Use Topics  . Smoking status: Never Smoker   . Smokeless tobacco: Not on file  . Alcohol Use: No    OB History    Grav Para Term Preterm Abortions TAB SAB Ect Mult Living                 Review of Systems  All other systems reviewed and are negative.  except as noted HPI   Allergies  Shellfish allergy; Cephalexin; Decadron; Erythromycin; Heparin; Iohexol; Ketorolac tromethamine; Methocarbamol; Penicillins; Prednisone; and Questran  Home Medications   Current Outpatient Rx  Name Route Sig Dispense Refill  . ASPIRIN 81 MG PO TABS Oral Take 81 mg by mouth daily.      . ATENOLOL 25 MG PO TABS Oral Take 25 mg by mouth daily.      Marland Kitchen DIAZEPAM 10 MG PO TABS Oral Take 10 mg by mouth 2 (two) times daily as needed. For anxiety    .  INSULIN ASPART 100 UNIT/ML [place] SOLN Subcutaneous Inject 0-10 Units into the skin 3 (three) times daily before meals. Based on Sliding scale    . INSULIN GLARGINE 100 UNIT/ML [place] SOLN Subcutaneous Inject 25 Units into the skin at bedtime.     Marland Kitchen LISINOPRIL-HYDROCHLOROTHIAZIDE 10-12.5 MG PO TABS Oral Take 1 tablet by mouth daily.    Marland Kitchen METFORMIN HCL 850 MG PO TABS Oral Take 850 mg by mouth 2 (two) times daily with a meal.     . NITROGLYCERIN 0.4 MG SL SUBL Sublingual Place 0.4 mg under the tongue every 5 (five) minutes as needed. For chest pain.     Marland Kitchen PANTOPRAZOLE SODIUM 40 MG PO TBEC Oral Take 40 mg by mouth 2 (two) times daily before a meal.      . SIMVASTATIN 40 MG PO TABS Oral Take 40 mg by mouth at bedtime.     . SUCRALFATE 1 GM/10ML PO SUSP Oral Take 10 mLs (1 g total) by mouth every 6 (six) hours as needed. 420  mL 0    BP 131/80  Pulse 95  Temp 98.7 F (37.1 C) (Oral)  Resp 16  SpO2 91%  LMP 09/26/2011   Physical Exam  Nursing note and vitals reviewed. Constitutional: She is oriented to person, place, and time. She appears well-developed.  HENT:  Head: Atraumatic.  Mouth/Throat: Oropharynx is clear and moist.  Eyes: Conjunctivae and EOM are normal. Pupils are equal, round, and reactive to light.  Neck: Normal range of motion. Neck supple.  Cardiovascular: Normal rate, regular rhythm, normal heart sounds and intact distal pulses.   Pulmonary/Chest: Effort normal and breath sounds normal. No respiratory distress. She has no wheezes. She has no rales. She exhibits tenderness.  Abdominal: Soft. She exhibits no distension. There is no tenderness. There is no rebound and no guarding.       No cvat  Musculoskeletal: Normal range of motion. She exhibits no edema and no tenderness.  Neurological: She is alert and oriented to person, place, and time.  Skin: Skin is warm and dry. No rash noted.  Psychiatric: She has a normal mood and affect.    Date: 03/17/2012  Rate: 88  Rhythm: normal sinus rhythm  QRS Axis: normal  Intervals: normal  ST/T Wave abnormalities: nonspecific ST changes, ST depressions inferiorly and ST depressions laterally  Conduction Disutrbances:none  Narrative Interpretation:   Old EKG Reviewed: no significant change from previous    ED Course  Procedures (including critical care time)  Labs Reviewed  CBC WITH DIFFERENTIAL - Abnormal; Notable for the following:    WBC 11.2 (*)     Neutro Abs 7.9 (*)     All other components within normal limits  BASIC METABOLIC PANEL - Abnormal; Notable for the following:    Sodium 131 (*)     Chloride 95 (*)     Glucose, Bld 355 (*)     All other components within normal limits  GLUCOSE, CAPILLARY - Abnormal; Notable for the following:    Glucose-Capillary 329 (*)     All other components within normal limits  HEPATIC  FUNCTION PANEL - Abnormal; Notable for the following:    Alkaline Phosphatase 210 (*)     Total Bilirubin 0.2 (*)     All other components within normal limits  URINALYSIS, ROUTINE W REFLEX MICROSCOPIC - Abnormal; Notable for the following:    APPearance CLOUDY (*)     Glucose, UA >1000 (*)     Leukocytes, UA TRACE (*)  All other components within normal limits  URINE MICROSCOPIC-ADD ON - Abnormal; Notable for the following:    Squamous Epithelial / LPF MANY (*)     Bacteria, UA FEW (*)     All other components within normal limits  GLUCOSE, CAPILLARY - Abnormal; Notable for the following:    Glucose-Capillary 184 (*)     All other components within normal limits  LIPASE, BLOOD  POCT I-STAT TROPONIN I  CLOSTRIDIUM DIFFICILE BY PCR  STOOL CULTURE  URINE CULTURE   Dg Chest 2 View  03/17/2012  *RADIOLOGY REPORT*  Clinical Data: Chest pain  CHEST - 2 VIEW  Comparison: 03/09/2012  Findings: There is a left chest wall pacer device with lead in the right atrial appendage and right ventricle.  Heart size is normal.  No pleural effusion or edema.  Asymmetric elevation of the right hemidiaphragm is noted.  There is plate-like atelectasis noted in the right base.  IMPRESSION:  1.  Right base atelectasis.  Original Report Authenticated By: Rosealee Albee, M.D.    1. Chest pain   2. Abdominal pain   3. Diarrhea   4. Diabetes mellitus type 2, controlled   5. Hypertension, benign    MDM  61yoF h/o HTN, HLD. DM pw chest pain. DDx broad including ACS, msk, less likely PE, dissection by history. EKG non diagnostic--NS ST changes similar to previous. Ongoing pain in ED. Also with min abd pain, diarrhea, N/V. Abdomen benign. Urine with many squamous cells, no nitrites--sent for culture. Pain uncontrolled in ED. Trop negative. [facility]. C diff pending in light of recent abx use. Discussed admission with [facility].        Forbes Cellar, MD 03/17/12 2351

## 2012-03-17 NOTE — ED Notes (Addendum)
Patient currently resting quietly in bed; no respiratory or acute distress noted.  Patient updated on plan of care; informed patient that we are currently waiting for admitting MD to come and see patient.  Patient unable to give stool sample at this time.  Patient has no other questions or concerns at this time; will continue to monitor.

## 2012-03-17 NOTE — ED Notes (Signed)
Patient currently resting quietly in bed; no respiratory or acute distress noted.  Patient updated on plan of care; informed patient that a bed has been requested and that we are currently waiting on a bed assignment.  Patient has no other questions or concerns at this time; will continue to monitor.

## 2012-03-17 NOTE — ED Notes (Signed)
Admitting MD at bedside.

## 2012-03-17 NOTE — H&P (Signed)
PCP:   Galvin Proffer, MD   Chief Complaint:  Chest pain  HPI: 61 yo female with cp since earlier this am with n/v/d has been on recent abx for uti.  No fevers.  Cp radiates to left jaw.  Has had recurrent cp over last several years with nml cath about 5 years ago when she had her pacer placed for bradycardia.  Also had nml myoview stress test 12/12.  Vomit nonbloody.  Pain not relieved with ntg.  Also pain is slightly reproducible with palp.  Denies abd pain.  Review of Systems:  O/w neg  Past Medical History: Past Medical History  Diagnosis Date  . Hypertension   . High cholesterol   . Pacemaker   . Diabetes mellitus   . Arthritis   . Headache   . PONV (postoperative nausea and vomiting)   . Coronary artery disease   . Anxiety   . Fibromyalgia   . Dysrhythmia   . Cancer     Tumor on uterus   Past Surgical History  Procedure Date  . Abdominal hysterectomy   . Joint replacement     right knee  . Cholecystectomy     2005-2006  . Insert / replace / remove pacemaker     2008, [employer]tronic, ICD  . Fracture surgery     left ankle    Medications: Prior to Admission medications   Medication Sig Start Date End Date Taking? Authorizing Provider  aspirin 81 MG tablet Take 81 mg by mouth daily.     Yes Historical Provider, MD  atenolol (TENORMIN) 25 MG tablet Take 25 mg by mouth daily.     Yes Historical Provider, MD  diazepam (VALIUM) 10 MG tablet Take 10 mg by mouth 2 (two) times daily as needed. For anxiety   Yes Historical Provider, MD  insulin aspart (NOVOLOG) 100 UNIT/ML injection Inject 0-10 Units into the skin 3 (three) times daily before meals. Based on Sliding scale   Yes Historical Provider, MD  insulin glargine (LANTUS) 100 UNIT/ML injection Inject 25 Units into the skin at bedtime.    Yes Historical Provider, MD  lisinopril-hydrochlorothiazide (PRINZIDE,ZESTORETIC) 10-12.5 MG per tablet Take 1 tablet by mouth daily.   Yes Historical Provider, MD  metFORMIN  (GLUCOPHAGE) 850 MG tablet Take 850 mg by mouth 2 (two) times daily with a meal.    Yes Historical Provider, MD  nitroGLYCERIN (NITROSTAT) 0.4 MG SL tablet Place 0.4 mg under the tongue every 5 (five) minutes as needed. For chest pain.  07/26/11 07/25/12 Yes Shanker Levora Dredge, MD  pantoprazole (PROTONIX) 40 MG tablet Take 40 mg by mouth 2 (two) times daily before a meal.   08/01/11 07/31/12 Yes Belkys A Regalado, MD  simvastatin (ZOCOR) 40 MG tablet Take 40 mg by mouth at bedtime.    Yes Historical Provider, MD  sucralfate (CARAFATE) 1 GM/10ML suspension Take 10 mLs (1 g total) by mouth every 6 (six) hours as needed. 11/08/11 12/08/11  Gerhard Munch, MD    Allergies:   Allergies  Allergen Reactions  . Shellfish Allergy Anaphylaxis  . Cephalexin Nausea And Vomiting  . Decadron (Dexamethasone) Nausea And Vomiting  . Erythromycin Itching  . Heparin     Nosebleed  . Iohexol      Desc: pt states reaction is anaphalactic shock from IV dye and any kind of shellfish. pt states she almost died the last time she had a reaction.   Marland Kitchen Ketorolac Tromethamine Other (See Comments)    unknown  .  Methocarbamol Itching and Nausea And Vomiting  . Penicillins Itching  . Prednisone Nausea And Vomiting  . Questran Nausea And Vomiting    Social History:  reports that she has never smoked. She does not have any smokeless tobacco history on file. She reports that she does not drink alcohol or use illicit drugs.  Family History: Family History  Problem Relation Age of Onset  . Coronary artery disease Mother     s/p stenting, currently 53  . Cancer Father     heavy smoker, died of lung cancer in 3  . Brain cancer Brother     ? CAD    Physical Exam: Filed Vitals:   03/17/12 2145 03/17/12 2204 03/17/12 2230 03/17/12 2315  BP: 141/91 141/91 117/94 131/80  Pulse: 100 104 92 95  Temp:      TempSrc:      Resp:  16    SpO2: 93% 92% 91% 91%   General appearance: alert, cooperative and no  distress Lungs: clear to auscultation bilaterally Heart: regular rate and rhythm, S1, S2 normal, no murmur, click, rub or gallop Abdomen: soft, non-tender; bowel sounds normal; no masses,  no organomegaly Extremities: extremities normal, atraumatic, no cyanosis or edema Pulses: 2+ and symmetric Skin: Skin color, texture, turgor normal. No rashes or lesions Neurologic: Grossly normal    Labs on Admission:   [facility] 03/17/12 1646  NA 131*  K 3.7  CL 95*  CO2 19  GLUCOSE 355*  BUN 8  CREATININE 0.56  CALCIUM 9.1  MG --  PHOS --    Basename 03/17/12 1753  AST 18  ALT 15  ALKPHOS 210*  BILITOT 0.2*  PROT 7.6  ALBUMIN 3.5    Basename 03/17/12 1753  LIPASE 30  AMYLASE --    Basename 03/17/12 1646  WBC 11.2*  NEUTROABS 7.9*  HGB 12.8  HCT 39.5  MCV 83.5  PLT 238   Radiological Exams on Admission: Dg Chest 2 View  03/17/2012  *RADIOLOGY REPORT*  Clinical Data: Chest pain  CHEST - 2 VIEW  Comparison: 03/09/2012  Findings: There is a left chest wall pacer device with lead in the right atrial appendage and right ventricle.  Heart size is normal.  No pleural effusion or edema.  Asymmetric elevation of the right hemidiaphragm is noted.  There is plate-like atelectasis noted in the right base.  IMPRESSION:  1.  Right base atelectasis.  Original Report Authenticated By: Rosealee Albee, M.D.    Assessment/Plan Present on Admission:  61 yo female with atyp cp /n/v/d .Chest pain syndrome .DM (diabetes mellitus) .Hypertension, benign .Hyperlipemia, mixed .Nausea and vomiting in adult .Morbid obesity, BMI not known  ekg neg.  3 sets of enzymes already neg.  Asked to admit as pt still having cp.  Has not received full dosing of ntg in ED.  Will provide with nt sl and if pain not relieved place ntg paste.  Doubt cardiac.  Will provide with gi cocktail.  Serial enzymes.  Ck cdiff for diarrhea.  May need cards consult due to recurrent cp symptoms but just had stress test  12/12 that was normal.  Flavio Lindroth [phone] 03/17/2012, 11:31 PM

## 2012-03-17 NOTE — ED Notes (Signed)
IV team paged for access 

## 2012-03-17 NOTE — ED Notes (Signed)
Patient currently sitting up in bed; no respiratory or acute distress noted.  Patient requesting pain medication; EDP notified.  Patient has no other questions or concerns at this time.  Will continue to monitor.

## 2012-03-17 NOTE — ED Notes (Signed)
IV team at bedside 

## 2012-03-17 NOTE — ED Notes (Signed)
Pt reports center chest pain radiating to left side of jaw and shoulder. Pt also reports that her blood sugar was >400 at home. Pt recently being treated for UTI and yeast infection. Chest pain accompained by shortness if breath, diaphoresis, N/V. Skin cool, clammy to touch

## 2012-03-17 NOTE — ED Notes (Signed)
Pt bedpan changed out and aware we need a stool sample.

## 2012-03-17 NOTE — ED Notes (Signed)
Admitting MD requests to change SL nitro tablet to nitro paste; order placed in chart.

## 2012-03-17 NOTE — ED Notes (Signed)
Received bedside report from [place], [place].  Patient currently sitting up in bed; no respiratory or acute distress noted.  Patient updated on plan of care; informed patient that we need a urine and stool sample.  Patient has no other questions or concerns at this time; will continue to monitor.

## 2012-03-18 ENCOUNTER — Encounter (HOSPITAL_COMMUNITY): Payer: Self-pay | Admitting: *Deleted

## 2012-03-18 DIAGNOSIS — R079 Chest pain, unspecified: Secondary | ICD-10-CM

## 2012-03-18 DIAGNOSIS — F329 Major depressive disorder, single episode, unspecified: Secondary | ICD-10-CM | POA: Diagnosis present

## 2012-03-18 DIAGNOSIS — R197 Diarrhea, unspecified: Secondary | ICD-10-CM

## 2012-03-18 DIAGNOSIS — R109 Unspecified abdominal pain: Secondary | ICD-10-CM

## 2012-03-18 DIAGNOSIS — F341 Dysthymic disorder: Secondary | ICD-10-CM

## 2012-03-18 DIAGNOSIS — K521 Toxic gastroenteritis and colitis: Secondary | ICD-10-CM | POA: Diagnosis present

## 2012-03-18 DIAGNOSIS — F32A Depression, unspecified: Secondary | ICD-10-CM | POA: Diagnosis present

## 2012-03-18 DIAGNOSIS — M797 Fibromyalgia: Secondary | ICD-10-CM | POA: Diagnosis present

## 2012-03-18 LAB — CARDIAC PANEL(CRET KIN+CKTOT+MB+TROPI)
CK, MB: 1.3 ng/mL (ref 0.3–4.0)
Total CK: 44 U/L (ref 7–177)
Troponin I: <0.3 ng/mL (ref <0.30)
Troponin I: <0.3 ng/mL (ref <0.30)

## 2012-03-18 LAB — BASIC METABOLIC PANEL
BUN: 8 mg/dL (ref 6–23)
CO2: 23 mEq/L (ref 19–32)
Chloride: 98 mEq/L (ref 96–112)
Glucose, Bld: 227 mg/dL — ABNORMAL HIGH (ref 70–99)
Potassium: 3.9 mEq/L (ref 3.5–5.1)
Sodium: 137 mEq/L (ref 135–145)

## 2012-03-18 LAB — CBC
HCT: 42.4 % (ref 36.0–46.0)
Hemoglobin: 13.8 g/dL (ref 12.0–15.0)
MCH: 27.7 pg (ref 26.0–34.0)
MCHC: 32.5 g/dL (ref 30.0–36.0)
MCV: 85 fL (ref 78.0–100.0)
RBC: 4.99 MIL/uL (ref 3.87–5.11)

## 2012-03-18 LAB — MRSA PCR SCREENING: MRSA by PCR: NEGATIVE

## 2012-03-18 LAB — GLUCOSE, CAPILLARY: Glucose-Capillary: 253 mg/dL — ABNORMAL HIGH (ref 70–99)

## 2012-03-18 LAB — HEMOGLOBIN A1C
Hgb A1c MFr Bld: 11.4 % — ABNORMAL HIGH (ref <5.7)
Mean Plasma Glucose: 280 mg/dL — ABNORMAL HIGH (ref <117)

## 2012-03-18 MED ORDER — INSULIN ASPART 100 UNIT/ML ~~LOC~~ SOLN
0.0000 [IU] | Freq: Three times a day (TID) | SUBCUTANEOUS | Status: DC
  Administered 2012-03-18: 11 [IU] via SUBCUTANEOUS
  Administered 2012-03-19: 20 [IU] via SUBCUTANEOUS
  Administered 2012-03-19: 7 [IU] via SUBCUTANEOUS
  Administered 2012-03-19: 15 [IU] via SUBCUTANEOUS
  Administered 2012-03-20: 11 [IU] via SUBCUTANEOUS
  Administered 2012-03-20: 4 [IU] via SUBCUTANEOUS

## 2012-03-18 MED ORDER — MORPHINE SULFATE 15 MG PO TABS
15.0000 mg | ORAL_TABLET | ORAL | Status: DC | PRN
  Administered 2012-03-18 – 2012-03-19 (×7): 15 mg via ORAL
  Filled 2012-03-18 (×8): qty 1

## 2012-03-18 MED ORDER — MORPHINE SULFATE 2 MG/ML IJ SOLN
1.0000 mg | INTRAMUSCULAR | Status: DC | PRN
  Administered 2012-03-19 – 2012-03-20 (×2): 1 mg via INTRAVENOUS
  Filled 2012-03-18 (×2): qty 1

## 2012-03-18 MED ORDER — ASPIRIN EC 81 MG PO TBEC
81.0000 mg | DELAYED_RELEASE_TABLET | Freq: Every day | ORAL | Status: DC
  Administered 2012-03-18 – 2012-03-20 (×3): 81 mg via ORAL
  Filled 2012-03-18 (×3): qty 1

## 2012-03-18 MED ORDER — SODIUM CHLORIDE 0.9 % IV SOLN
1.0000 mL/kg/h | INTRAVENOUS | Status: DC
  Administered 2012-03-19: 1 mL/kg/h via INTRAVENOUS

## 2012-03-18 MED ORDER — PREDNISONE 50 MG PO TABS
60.0000 mg | ORAL_TABLET | ORAL | Status: AC
  Administered 2012-03-19: 60 mg via ORAL
  Filled 2012-03-18 (×2): qty 1

## 2012-03-18 MED ORDER — PREDNISONE 50 MG PO TABS
60.0000 mg | ORAL_TABLET | ORAL | Status: DC
  Filled 2012-03-18: qty 1

## 2012-03-18 MED ORDER — ASPIRIN 81 MG PO CHEW
324.0000 mg | CHEWABLE_TABLET | ORAL | Status: AC
  Administered 2012-03-19: 324 mg via ORAL
  Filled 2012-03-18: qty 4

## 2012-03-18 MED ORDER — HYDROCHLOROTHIAZIDE 12.5 MG PO CAPS
12.5000 mg | ORAL_CAPSULE | Freq: Every day | ORAL | Status: DC
  Administered 2012-03-18 – 2012-03-20 (×3): 12.5 mg via ORAL
  Filled 2012-03-18 (×3): qty 1

## 2012-03-18 MED ORDER — ONDANSETRON HCL 4 MG PO TABS
4.0000 mg | ORAL_TABLET | Freq: Four times a day (QID) | ORAL | Status: DC | PRN
  Administered 2012-03-18 – 2012-03-19 (×5): 4 mg via ORAL
  Filled 2012-03-18 (×6): qty 1

## 2012-03-18 MED ORDER — SODIUM CHLORIDE 0.9 % IJ SOLN: 3.0000 mL | INTRAMUSCULAR | Status: DC | PRN

## 2012-03-18 MED ORDER — DIPHENHYDRAMINE HCL 50 MG/ML IJ SOLN: 25.0000 mg | INTRAMUSCULAR | Status: DC

## 2012-03-18 MED ORDER — SODIUM CHLORIDE 0.9 % IV SOLN: INTRAVENOUS | Status: AC

## 2012-03-18 MED ORDER — ATENOLOL 25 MG PO TABS
25.0000 mg | ORAL_TABLET | Freq: Every day | ORAL | Status: DC
  Administered 2012-03-18 – 2012-03-20 (×3): 25 mg via ORAL
  Filled 2012-03-18 (×3): qty 1

## 2012-03-18 MED ORDER — SODIUM CHLORIDE 0.9 % IJ SOLN
10.0000 mL | INTRAMUSCULAR | Status: DC | PRN
  Administered 2012-03-18: 10 mL

## 2012-03-18 MED ORDER — SODIUM CHLORIDE 0.9 % IJ SOLN
10.0000 mL | Freq: Two times a day (BID) | INTRAMUSCULAR | Status: DC
  Administered 2012-03-18: 10 mL

## 2012-03-18 MED ORDER — FAMOTIDINE IN NACL 20-0.9 MG/50ML-% IV SOLN: 20.0000 mg | INTRAVENOUS | Status: DC

## 2012-03-18 MED ORDER — ONDANSETRON HCL 4 MG/2ML IJ SOLN
4.0000 mg | Freq: Four times a day (QID) | INTRAMUSCULAR | Status: DC | PRN
  Administered 2012-03-20: 4 mg via INTRAVENOUS
  Filled 2012-03-18: qty 2

## 2012-03-18 MED ORDER — FAMOTIDINE IN NACL 20-0.9 MG/50ML-% IV SOLN
20.0000 mg | INTRAVENOUS | Status: AC
  Administered 2012-03-19: 20 mg via INTRAVENOUS
  Filled 2012-03-18: qty 50

## 2012-03-18 MED ORDER — INSULIN ASPART 100 UNIT/ML ~~LOC~~ SOLN
0.0000 [IU] | Freq: Every day | SUBCUTANEOUS | Status: DC
  Administered 2012-03-18: 3 [IU] via SUBCUTANEOUS
  Administered 2012-03-19: 4 [IU] via SUBCUTANEOUS

## 2012-03-18 MED ORDER — INSULIN ASPART 100 UNIT/ML ~~LOC~~ SOLN: 0.0000 [IU] | Freq: Three times a day (TID) | SUBCUTANEOUS | Status: DC

## 2012-03-18 MED ORDER — ALUM & MAG HYDROXIDE-SIMETH 200-200-20 MG/5ML PO SUSP: 30.0000 mL | Freq: Four times a day (QID) | ORAL | Status: DC | PRN

## 2012-03-18 MED ORDER — SIMVASTATIN 40 MG PO TABS
40.0000 mg | ORAL_TABLET | Freq: Every day | ORAL | Status: DC
  Administered 2012-03-18 – 2012-03-19 (×3): 40 mg via ORAL
  Filled 2012-03-18 (×5): qty 1

## 2012-03-18 MED ORDER — INSULIN GLARGINE 100 UNIT/ML ~~LOC~~ SOLN
25.0000 [IU] | Freq: Every day | SUBCUTANEOUS | Status: DC
  Administered 2012-03-18: 25 [IU] via SUBCUTANEOUS

## 2012-03-18 MED ORDER — PANTOPRAZOLE SODIUM 40 MG PO TBEC
40.0000 mg | DELAYED_RELEASE_TABLET | Freq: Two times a day (BID) | ORAL | Status: DC
  Administered 2012-03-18 – 2012-03-20 (×4): 40 mg via ORAL
  Filled 2012-03-18 (×4): qty 1

## 2012-03-18 MED ORDER — LISINOPRIL 10 MG PO TABS
10.0000 mg | ORAL_TABLET | Freq: Every day | ORAL | Status: DC
  Administered 2012-03-18 – 2012-03-20 (×3): 10 mg via ORAL
  Filled 2012-03-18 (×3): qty 1

## 2012-03-18 MED ORDER — ASPIRIN EC 81 MG PO TBEC: 81.0000 mg | DELAYED_RELEASE_TABLET | Freq: Every day | ORAL | Status: DC

## 2012-03-18 MED ORDER — DIAZEPAM 5 MG PO TABS
10.0000 mg | ORAL_TABLET | Freq: Two times a day (BID) | ORAL | Status: DC | PRN
  Administered 2012-03-18 – 2012-03-19 (×2): 10 mg via ORAL
  Filled 2012-03-18 (×2): qty 2

## 2012-03-18 MED ORDER — INSULIN GLARGINE 100 UNIT/ML ~~LOC~~ SOLN
30.0000 [IU] | Freq: Every day | SUBCUTANEOUS | Status: DC
  Administered 2012-03-18: 15 [IU] via SUBCUTANEOUS
  Administered 2012-03-19: 30 [IU] via SUBCUTANEOUS

## 2012-03-18 MED ORDER — INSULIN ASPART 100 UNIT/ML ~~LOC~~ SOLN
0.0000 [IU] | Freq: Three times a day (TID) | SUBCUTANEOUS | Status: DC
  Administered 2012-03-18: 5 [IU] via SUBCUTANEOUS
  Administered 2012-03-18: 3 [IU] via SUBCUTANEOUS

## 2012-03-18 MED ORDER — SODIUM CHLORIDE 0.9 % IJ SOLN: 3.0000 mL | Freq: Two times a day (BID) | INTRAMUSCULAR | Status: DC

## 2012-03-18 MED ORDER — SODIUM CHLORIDE 0.9 % IV SOLN: 250.0000 mL | INTRAVENOUS | Status: DC | PRN

## 2012-03-18 MED ORDER — LISINOPRIL-HYDROCHLOROTHIAZIDE 10-12.5 MG PO TABS: 1.0000 | ORAL_TABLET | Freq: Every day | ORAL | Status: DC

## 2012-03-18 MED ORDER — DIPHENHYDRAMINE HCL 50 MG/ML IJ SOLN
25.0000 mg | INTRAMUSCULAR | Status: AC
  Administered 2012-03-19: 25 mg via INTRAVENOUS
  Filled 2012-03-18: qty 1

## 2012-03-18 NOTE — Progress Notes (Signed)
Peripherally Inserted Central Catheter/Midline Placement  The IV Nurse has discussed with the patient and/or persons authorized to consent for the patient, the purpose of this procedure and the potential benefits and risks involved with this procedure.  The benefits include less needle sticks, lab draws from the catheter and patient may be discharged home with the catheter.  Risks include, but not limited to, infection, bleeding, blood clot (thrombus formation), and puncture of an artery; nerve damage and irregular heat beat.  Alternatives to this procedure were also discussed.  PICC/Midline Placement Documentation  PICC / Midline Single Lumen 03/18/12 Midline Right Basilic (Active)       [name] 03/18/2012, 12:39 PM

## 2012-03-18 NOTE — ED Notes (Signed)
Report given to [place], Charity fundraiser.  No further questions/concerns from RN.  Informed RN that she can call back with any questions/concerns once patient arrives to floor.  Will continue to monitor.

## 2012-03-18 NOTE — ED Notes (Signed)
Calling report now. 

## 2012-03-18 NOTE — Progress Notes (Signed)
  Echocardiogram 2D Echocardiogram has been performed.  [name] 03/18/2012, 11:57 AM

## 2012-03-18 NOTE — Progress Notes (Signed)
Inpatient Diabetes Program Recommendations  AACE/ADA: New Consensus Statement on Inpatient Glycemic Control (2009)  Target Ranges:  Prepandial:   less than 140 mg/dL      Peak postprandial:   less than 180 mg/dL (1-2 hours)      Critically ill patients:  140 - 180 mg/dL   Results for [name], [name] (MRN [id number]) as of 03/18/2012 15:55  Ref. Range 03/17/2012 16:03 03/17/2012 22:32 03/18/2012 08:27 03/18/2012 13:14  Glucose-Capillary Latest Range: 70-99 mg/dL 409 (H) 811 (H) 914 (H) 253 (H)    Inpatient Diabetes Program Recommendations Insulin - Basal: Increase Lantus to 30 units  Correction (SSI): Increase to MODERATE scale  A1C results pending Will follow  Thank you  Piedad Climes [facility] Inpatient Diabetes Coordinator [phone]

## 2012-03-18 NOTE — Progress Notes (Signed)
[facility] [place] TEAM 1 - Stepdown/ICU TEAM  Interval history: 61 yo female with cp beginning Sunday prior to church-this was associated with n/v/d. Endorses has been on recent abx for uti. No fevers. Cp radiates to left jaw. Has had recurrent cp over last several years with nml cath about 5 years ago when she had her pacer placed for bradycardia. Also had nml myoview stress test 12/12. Vomit nonbloody. Pain not relieved with ntg. Also pain is slightly reproducible with palp. Denies abd pain.  Subjective: Alert and complaining of generalized malaise and feeling poorly all over. Endorses having multiple watery stools prior to admission although RN states no diarrhea since arrival. Patient endorses has been on 3 different antibiotics prior to this admission.  Objective: Blood pressure 121/79, pulse 66, temperature 97.7 F (36.5 C), temperature source Oral, resp. rate 20, height 5\' 5"  (1.651 m), weight 96.1 kg (211 lb 13.8 oz), last menstrual period 09/26/2011, SpO2 90.00%.   Intake/Output from previous day: 06/30 0701 - 07/01 0700 In: 1000 [I.V.:1000] Out: 1 [Urine:1] Intake/Output this shift: Total I/O In: 360 [P.O.:360] Out: -   General appearance: alert, appears older than stated age, flushed and no distress Resp: clear to auscultation bilaterally Cardio: regular rate and rhythm, S1, S2 normal, no murmur, click, rub or gallop GI: soft, non-tender; bowel sounds normal; no masses,  no organomegaly Extremities: extremities normal, atraumatic, no cyanosis or edema Neurologic: Grossly normal  Lab Results:  Basename 03/18/12 0500 03/17/12 1646  WBC 8.3 11.2*  HGB 13.8 12.8  HCT 42.4 39.5  PLT 272 238   BMET  Basename 03/18/12 0500 03/17/12 1646  NA 137 131*  K 3.9 3.7  CL 98 95*  CO2 23 19  GLUCOSE 227* 355*  BUN 8 8  CREATININE 0.62 0.56  CALCIUM 8.8 9.1    Studies/Results: Dg Chest 2 View  03/17/2012  *RADIOLOGY REPORT*  Clinical Data: Chest pain  CHEST -  2 VIEW  Comparison: 03/09/2012  Findings: There is a left chest wall pacer device with lead in the right atrial appendage and right ventricle.  Heart size is normal.  No pleural effusion or edema.  Asymmetric elevation of the right hemidiaphragm is noted.  There is plate-like atelectasis noted in the right base.  IMPRESSION:  1.  Right base atelectasis.  Original Report Authenticated By: Rosealee Albee, M.D.   Medications: I have reviewed the patient's current medications.  Assessment/Plan:  Chest pain syndrome *Enzymes remain negative but are still cycling, EKG shows chronic ST segment changes in the inferior lateral leads *Was admitted in December 2012 and had a normal Lexiscan Myoview *Cardiac catheterization was considered at that time but because of patient's history of anaphylaxis related to ingestion of shellfish as well as a reported allergy to steroids cardiac catheterization was not pursued given the fact patient's enzymes and EKG were stable and without any acute ischemic changes *In reviewing the patient's old records including Echart, this patient has had multiple admissions for chest pain with similar presentation of left-sided pain that radiates to the left jaw on the left arm in each admission has revealed a negative workup *We will go ahead and consult cardiology-she has seen [place] in the past  DM (diabetes mellitus), type 2, uncontrolled *CBGs have been greater than 300 and this admission *Continue usual Lantus as well as sliding scale insulin *Glucophage on hold during acute illness *We will check a hemoglobin A1c-I suspect patient has poorly controlled diabetes prior to admission  Diarrhea-rule  out C. difficile colitis *Stool for C. difficile colitis is - pending the patient at the present time is not experiencing any further diarrhea  Hypertension, benign *Blood pressure is moderately controlled for a diabetic client *Continue home medications of Tenormin, lisinopril,  and hydrochlorothiazide  Nausea and vomiting in adult with history of recurrent abdominal pain/negative sigmoidoscopy and EGD 2008 *Patient has a history of recurrent GI symptoms with negative workup in the past *Suspect she may have underlying gastroparesis due to poorly controlled diabetes *May need outpatient GI evaluation *Consider nuclear medicine gastric emptying study *Check serum H. pylori as well as stool for blood *Continue Protonix and Carafate  Anxiety and depression with documented history of narcotic seeking behavior (2009)/ Fibromyalgia *Follow closely and avoid narcotics when possible *On Valium 10 mg twice a day when necessary at home  Hyperlipemia, mixed *On Zocor at home  Sinus node dysfunction *Prior pacemaker implantation  Morbid obesity, BMI not known  Disposition *Transfer to telemetry   LOS: 1 day   Junious Silk, ANP pager [phone]  [facility] [email or web address] Password: [email or web address]  03/18/2012, 12:56 PM  I have personally examined this patient and reviewed the entire database. I have reviewed the above note, made any necessary editorial changes, and agree with its content.  Lonia Blood, MD [facility]

## 2012-03-18 NOTE — Consult Note (Signed)
CARDIOLOGY CONSULT NOTE   Patient ID: [name] MRN: [id number] DOB/AGE: 1951/08/07 61 y.o.  Admit date: 03/17/2012  Primary Physician   Galvin Proffer, MD Primary Cardiologist   Dr Willeen Cass in [facility]; [address]; [place], [place] [address]; [phone] (Office)  Reason for Consultation   Chest pain  [id number] [name] is a 61 y.o. female with no history of CAD. She has had chest pain in the past, with a negative Myoview. She had onset of SSCP, radiating up into her left neck/jaw and slightly down her left arm at about 9:30 yesterday morning. Dull, heavy, and associated with some SOB, diaphoresis, N&V and diarrhea. The diarrhea had started 4 days previously. She has also been weak. She took SL NTG x 2 and ASA 325 mg. These meds changed the pain slightly. It started out at an 8-9/10 and was decreased to a 7-8/10. Tylenol, no help. CBG > 400 so she took her insulin. Pain worse with deep inspiration at first but no change with respirations now. Chest wall is tender. Same as pain in December (negative stress test) but stronger now. Her pain has been a 7/10 or more since it started yesterday morning.    Past Medical History  Diagnosis Date  . Hypertension   . High cholesterol   . Pacemaker   . Diabetes mellitus   . Arthritis   . Headache   . PONV (postoperative nausea and vomiting)   . Coronary artery disease - none seen on cath 2008  . Anxiety   . Fibromyalgia   . Dysrhythmia   . Cancer     Tumor on uterus     Past Surgical History  Procedure Date  . Abdominal hysterectomy   . Joint replacement     right knee  . Cholecystectomy     2005-2006  . Insert / replace / remove pacemaker     2008, [employer]  . Fracture surgery     left ankle    Allergies  Allergen Reactions  . Shellfish Allergy Anaphylaxis  . Cephalexin Nausea And Vomiting  . Decadron (Dexamethasone) Nausea And Vomiting  . Erythromycin Itching  . Heparin     Nosebleed  . Iohexol        Desc: pt states reaction is anaphalactic shock from IV dye and any kind of shellfish. pt states she almost died the last time she had a reaction.   Marland Kitchen Ketorolac Tromethamine Other (See Comments)    unknown  . Methocarbamol Itching and Nausea And Vomiting  . Penicillins Itching  . Prednisone Nausea And Vomiting    jitteriness  . Questran Nausea And Vomiting    I have reviewed the patient's current medications    . aspirin  324 mg Oral Once  . aspirin EC  81 mg Oral Daily  . atenolol  25 mg Oral Daily  . lisinopril  10 mg Oral Daily   And  . hydrochlorothiazide  12.5 mg Oral Daily  . insulin aspart  0-9 Units Subcutaneous TID WC  . insulin glargine  25 Units Subcutaneous QHS  .  morphine injection  4 mg Intravenous Once  .  morphine injection  4 mg Intravenous Once  . ondansetron      . ondansetron (ZOFRAN) IV  4 mg Intravenous Once  . pantoprazole  40 mg Oral BID AC  . simvastatin  40 mg Oral QHS  . sodium chloride  1,000 mL Intravenous Once      . sodium  chloride     alum & mag hydroxide-simeth, diazepam, morphine, morphine injection, nitroGLYCERIN, ondansetron (ZOFRAN) IV, ondansetron  Medication Sig  aspirin 81 MG tablet Take 81 mg by mouth daily.    atenolol (TENORMIN) 25 MG tablet Take 25 mg by mouth daily.    diazepam (VALIUM) 10 MG tablet Take 10 mg by mouth 2 (two) times daily as needed. For anxiety  insulin aspart (NOVOLOG) 100 UNIT/ML injection Inject 0-10 Units into the skin 3 (three) times daily before meals. Based on Sliding scale  insulin glargine (LANTUS) 100 UNIT/ML injection Inject 25 Units into the skin at bedtime.   lisinopril-hydrochlorothiazide (PRINZIDE) 10-12.5 MG per tablet Take 1 tablet by mouth daily.  metFORMIN (GLUCOPHAGE) 850 MG tablet Take 850 mg by mouth 2 (two) times daily with a meal.   nitroGLYCERIN (NITROSTAT) 0.4 MG SL tablet Place 0.4 mg under the tongue every 5 (five) minutes as needed.   pantoprazole (PROTONIX) 40 MG tablet Take  40 mg by mouth 2 (two) times daily before a meal.    simvastatin (ZOCOR) 40 MG tablet Take 40 mg by mouth at bedtime.   sucralfate (CARAFATE) 1 GM/10ML suspension Take 10 mLs (1 g total) by mouth every 6 (six) hours as needed.     History   Social History  . Marital Status: Married    Spouse Name: N/A    Number of Children: N/A  . Years of Education: N/A   Occupational History  . Not on file.   Social History Main Topics  . Smoking status: Never Smoker   . Smokeless tobacco: Not on file  . Alcohol Use: No  . Drug Use: No  . Sexually Active: Not Currently    Birth Control/ Protection: None   Other Topics Concern  . Not on file   Social History Narrative  . No narrative on file    Family History  Problem Relation Age of Onset  . Coronary artery disease Mother     s/p stenting, currently 94  . Cancer Father     heavy smoker, died of lung cancer in 60  . Brain cancer Brother     ? CAD     ROS: Pt feels like she does pretty well, generally gets around without a lot of pain. She has had GI issues recently, mainly with diarrhea, which was treated by Flagyl. She has been on ABX recently for a UTI and then a yeast infection. She has been weak for a while. No fevers or chills. Full 14 point review of systems complete and found to be negative unless listed above.  Physical Exam: Blood pressure 149/86, pulse 83, temperature 98.7 F (37.1 C), temperature source Oral, resp. rate 20, height 5\' 5"  (1.651 m), weight 211 lb 13.8 oz (96.1 kg), last menstrual period 09/26/2011, SpO2 95.00%.  General: Well developed, well nourished, female in no acute distress Head: Eyes PERRLA, No xanthomas.   Normocephalic and atraumatic, oropharynx without edema or exudate. Dentition- poor Lungs: few bilateral rales, good air exchange Heart: HRRR S1 S2, no rub/gallop, no significant murmur. pulses are 2+ all 4 extrem.   Neck: No carotid bruits. No lymphadenopathy.  JVD not elevated. Abdomen: Bowel  sounds present, abdomen soft and non-tender without masses or hernias noted. Msk:  No spine or cva tenderness. No weakness, no joint deformities or effusions. Extremities: No clubbing or cyanosis. No edema.  Neuro: Alert and oriented X 3. No focal deficits noted. Psych:  Good affect, responds appropriately Skin: No rashes or  lesions noted.  Labs:   Lab Results  Component Value Date   WBC 8.3 03/18/2012   HGB 13.8 03/18/2012   HCT 42.4 03/18/2012   MCV 85.0 03/18/2012   PLT 272 03/18/2012   No results found for this basename: INR in the last 72 hours  Lab 03/18/12 0500 03/17/12 1753  NA 137 --  K 3.9 --  CL 98 --  CO2 23 --  BUN 8 --  CREATININE 0.62 --  CALCIUM 8.8 --  PROT -- 7.6  BILITOT -- 0.2*  ALKPHOS -- 210*  ALT -- 15  AST -- 18  GLUCOSE 227* --    Basename 03/18/12 0130  CKTOTAL 44  CKMB 1.3  TROPONINI <0.30    Basename 03/17/12 1731  TROPIPOC 0.00    Lab Results  Component Value Date   CHOL 218* 08/21/2011   HDL 34* 08/21/2011   LDLCALC 138* 08/21/2011   TRIG 232* 08/21/2011   Lipase  Date/Time Value Range Status  03/17/2012  5:53 PM 30  11 - 59 U/Michaela Final   Lexiscan Myoview:08/22/2011 Findings: Gated images showed normal LV systolic function, EF 75% with normal wall motion. Rest and Lexiscan stress perfusion images showed no perfusion defects. Lexiscan stress ECG showed no significant changes. IMPRESSION: Normal Lexiscan Myoview. No evidence for ischemia or infarction, normal LV systolic function.  Echo: results pending  ECG:  17-Mar-2012 16:01:28 Normal sinus rhythm ST & T wave abnormality, consider inferior ischemia ST & T wave abnormality, consider anterolateral ischemia Vent. rate 88 BPM PR interval 154 ms QRS duration 76 ms QT/QTc 370/447 ms P-R-T axes 52 27 -58  Radiology:  Dg Chest 2 View 03/17/2012  *RADIOLOGY REPORT*  Clinical Data: Chest pain  CHEST - 2 VIEW  Comparison: 03/09/2012  Findings: There is a left chest wall pacer device with lead  in the right atrial appendage and right ventricle.  Heart size is normal.  No pleural effusion or edema.  Asymmetric elevation of the right hemidiaphragm is noted.  There is plate-like atelectasis noted in the right base.  IMPRESSION:  1.  Right base atelectasis.  Original Report Authenticated By: Rosealee Albee, M.D.    ASSESSMENT AND PLAN:   The patient was seen today by Dr Patty Sermons,  the patient evaluated and the data reviewed.  Principal Problem:  *Chest pain syndrome - symptoms are atypical and prolonged, but she has multiple cardiac risk factors. As a diabetic, she may not have typical pain. She has ruled out, but as she has had prolonged pain, will schedule cath to further assess anatomy. Further evaluation and treatment depending on the results. Pretreat for dye allergy - orders written.   Otherwise, per primary MD. Active Problems:  Hypertension, benign  Hyperlipemia, mixed  Nausea and vomiting in adult  Morbid obesity, BMI not known  DM (diabetes mellitus)   Signed: Theodore Demark 03/18/2012, 12:19 PM Co-Sign MD Patient seen in 2900 with Theodore Demark, PA.  Her pain has been constant since yesterday and has been requiring narcotics for relief.  Her pain has atypical as well as typical features for ischemic chest pain.  Her EKG shows widespread ST depression suggesting inferior and anterolateral ischemia but unchanged from prior EKGs.  With her multiple risk factors and to guide future therapy we will procede with left heart cath in am after pretreatment for dye allergy. Discussed with patient who is agreeable to proceed. Her physical exam is unremarkable and she does have some left-sided parasternal chest wall tenderness.

## 2012-03-18 NOTE — Progress Notes (Signed)
Gave pt 15mg  morphine PO tablet. Pill form comes in 30mg  so gave half tab and wasted other half tab in sharps with Dereck Ligas, RN as a witness.

## 2012-03-19 ENCOUNTER — Encounter (HOSPITAL_COMMUNITY)
Admission: EM | Disposition: A | Discharge status: Home / Self Care | Payer: Self-pay | Source: Home / Self Care | Attending: Internal Medicine

## 2012-03-19 DIAGNOSIS — R112 Nausea with vomiting, unspecified: Secondary | ICD-10-CM

## 2012-03-19 DIAGNOSIS — I2510 Atherosclerotic heart disease of native coronary artery without angina pectoris: Secondary | ICD-10-CM

## 2012-03-19 HISTORY — PX: LEFT HEART CATHETERIZATION WITH CORONARY ANGIOGRAM: SHX5451

## 2012-03-19 LAB — BASIC METABOLIC PANEL
BUN: 10 mg/dL (ref 6–23)
Creatinine, Ser: 0.73 mg/dL (ref 0.50–1.10)
GFR calc Af Amer: >90 mL/min (ref >90)
GFR calc non Af Amer: >90 mL/min (ref >90)

## 2012-03-19 LAB — GLUCOSE, CAPILLARY
Glucose-Capillary: 330 mg/dL — ABNORMAL HIGH (ref 70–99)
Glucose-Capillary: 267 mg/dL — ABNORMAL HIGH (ref 70–99)
Glucose-Capillary: 213 mg/dL — ABNORMAL HIGH (ref 70–99)

## 2012-03-19 LAB — CBC
HCT: 37.2 % (ref 36.0–46.0)
MCH: 27.8 pg (ref 26.0–34.0)
MCHC: 32.3 g/dL (ref 30.0–36.0)
MCV: 86.3 fL (ref 78.0–100.0)
RDW: 14.8 % (ref 11.5–15.5)

## 2012-03-19 LAB — URINE CULTURE

## 2012-03-19 SURGERY — LEFT HEART CATHETERIZATION WITH CORONARY ANGIOGRAM
Anesthesia: LOCAL

## 2012-03-19 MED ORDER — NITROGLYCERIN 0.2 MG/ML ON CALL CATH LAB
INTRAVENOUS | Status: AC
  Filled 2012-03-19: qty 1

## 2012-03-19 MED ORDER — ONDANSETRON HCL 4 MG/2ML IJ SOLN
INTRAMUSCULAR | Status: AC
  Filled 2012-03-19: qty 2

## 2012-03-19 MED ORDER — LIDOCAINE HCL (PF) 1 % IJ SOLN
INTRAMUSCULAR | Status: AC
  Filled 2012-03-19: qty 30

## 2012-03-19 MED ORDER — FENTANYL CITRATE 0.05 MG/ML IJ SOLN
INTRAMUSCULAR | Status: AC
  Filled 2012-03-19: qty 2

## 2012-03-19 MED ORDER — HEPARIN (PORCINE) IN NACL 2-0.9 UNIT/ML-% IJ SOLN
INTRAMUSCULAR | Status: AC
  Filled 2012-03-19: qty 2000

## 2012-03-19 MED ORDER — MIDAZOLAM HCL 2 MG/2ML IJ SOLN
INTRAMUSCULAR | Status: AC
  Filled 2012-03-19: qty 2

## 2012-03-19 MED ORDER — HEPARIN SODIUM (PORCINE) 1000 UNIT/ML IJ SOLN
INTRAMUSCULAR | Status: AC
  Filled 2012-03-19: qty 1

## 2012-03-19 MED ORDER — SODIUM CHLORIDE 0.9 % IV SOLN
1.0000 mL/kg/h | INTRAVENOUS | Status: AC
  Administered 2012-03-19: 1 mL/kg/h via INTRAVENOUS

## 2012-03-19 MED ORDER — LIDOCAINE HCL (PF) 1 % IJ SOLN
INTRAMUSCULAR | Status: AC
Start: 2012-03-19 — End: 2012-03-19
  Filled 2012-03-19: qty 30

## 2012-03-19 MED ORDER — MORPHINE SULFATE 4 MG/ML IJ SOLN
INTRAMUSCULAR | Status: AC
  Filled 2012-03-19: qty 1

## 2012-03-19 MED ORDER — SUCRALFATE 1 GM/10ML PO SUSP
1.0000 g | Freq: Three times a day (TID) | ORAL | Status: DC
  Administered 2012-03-19 – 2012-03-20 (×3): 1 g via ORAL
  Filled 2012-03-19 (×6): qty 10

## 2012-03-19 MED ORDER — VERAPAMIL HCL 2.5 MG/ML IV SOLN
INTRAVENOUS | Status: AC
  Filled 2012-03-19: qty 2

## 2012-03-19 NOTE — CV Procedure (Signed)
   Cardiac Catheterization Procedure Note  Name: [name] MRN: [id number] DOB: Sep 18, 1951  Procedure: Left Heart Cath, Selective Coronary Angiography, LV angiography  Indication: 61 year old white female who was evaluated for refractory chest pain.   Procedural details: We initially gained access via the right radial artery. This time the modified Seldinger technique. However, we were unable to pass the wire into the right innominate. There was an area of significant tortuosity. We therefore switched to a femoral approach. The right groin was prepped, draped, and anesthetized with 1% lidocaine. Using modified Seldinger technique, a 5 French sheath was introduced into the right femoral artery. Standard Judkins catheters were used for coronary angiography and left ventriculography. Catheter exchanges were performed over a guidewire. There were no immediate procedural complications. The patient was transferred to the post catheterization recovery area for further monitoring.  Procedural Findings: Hemodynamics:  AO 121/75 mean of 96 mmHg LV 125/19 mmHg   Coronary angiography: Coronary dominance: right  Left mainstem: Normal.  Left anterior descending (LAD): The LAD has mild disease in the mid vessel up to 30%.  Left circumflex (LCx): The left circumflex is a large vessel with a single marginal branch. It has 10-20% irregularities in the proximal vessel.   Right coronary artery (RCA): The right coronary is a dominant vessel. There is a focal 30% stenosis in the mid vessel.  Left ventriculography: Left ventricular systolic function is normal, LVEF is estimated at 55-65%, there is no significant mitral regurgitation   Final Conclusions:  1. Nonobstructive coronary disease. 2. Normal left ventricular function.   Recommendations: Evaluate for noncardiac causes of chest pain.  Theron Arista [facility] 03/19/2012, 12:51 PM

## 2012-03-19 NOTE — Progress Notes (Signed)
Subjective: Patient seen after cardiac catheter.  Patient complaining of chest pain.  Reports nausea with poor appetite.  Patient reports having diarrhea before presenting to the hospital, has not had any diarrhea over the last 24 hours.  Objective: Vital signs in last 24 hours: Filed Vitals:   03/18/12 2041 03/19/12 0249 03/19/12 0504 03/19/12 1202  BP: 133/78  109/65   Pulse: 66  64 65  Temp: 98 F (36.7 C)  97.2 F (36.2 C)   TempSrc: Oral  Oral   Resp: 20  19   Height:      Weight:  96.3 kg (212 lb 4.9 oz)    SpO2: 92%  91%    Weight change: -0.7 kg (-1 lb 8.7 oz)  Intake/Output Summary (Last 24 hours) at 03/19/12 1720 Last data filed at 03/19/12 1320  Gross per 24 hour  Intake   1280 ml  Output      0 ml  Net   1280 ml    Physical Exam: General: Awake, Oriented, No acute distress. HEENT: EOMI. Neck: Supple CV: S1 and S2 Lungs: Clear to ascultation bilaterally Abdomen: Soft, Nontender, Nondistended, +bowel sounds. Ext: Good pulses. Trace edema.  Lab Results: Basic Metabolic Panel:  Lab 03/19/12 1610 03/18/12 0500 03/17/12 1646  NA 135 137 131*  K 4.7 3.9 3.7  CL 99 98 95*  CO2 26 23 19   GLUCOSE 356* 227* 355*  BUN 10 8 8   CREATININE 0.73 0.62 0.56  CALCIUM 8.4 8.8 9.1  MG -- -- --  PHOS -- -- --   Liver Function Tests:  Lab 03/17/12 1753  AST 18  ALT 15  ALKPHOS 210*  BILITOT 0.2*  PROT 7.6  ALBUMIN 3.5    Lab 03/17/12 1753  LIPASE 30  AMYLASE --   No results found for this basename: AMMONIA:5 in the last 168 hours CBC:  Lab 03/19/12 0514 03/18/12 0500 03/17/12 1646  WBC 11.0* 8.3 11.2*  NEUTROABS -- -- 7.9*  HGB 12.0 13.8 12.8  HCT 37.2 42.4 39.5  MCV 86.3 85.0 83.5  PLT 268 272 238   Cardiac Enzymes:  Lab 03/18/12 2023 03/18/12 1340 03/18/12 0130  CKTOTAL 44 40 44  CKMB 1.6 1.4 1.3  CKMBINDEX -- -- --  TROPONINI <0.30 <0.30 <0.30   BNP (last 3 results) No results found for this basename: PROBNP:3 in the last 8760  hours CBG:  Lab 03/19/12 1621 03/19/12 1335 03/19/12 1115 03/19/12 0706 03/18/12 2052  GLUCAP 213* 267* 330* 355* 259*    Basename 03/18/12 1340  HGBA1C 11.4*   Other Labs: No components found with this basename: POCBNP:3 No results found for this basename: DDIMER:2 in the last 168 hours No results found for this basename: CHOL:2,HDL:2,LDLCALC:2,TRIG:2,CHOLHDL:2,LDLDIRECT:2 in the last 168 hours No results found for this basename: TSH,T4TOTAL,FREET3,T3FREE,FREET4,THYROIDAB in the last 168 hours No results found for this basename: VITAMINB12:2,FOLATE:2,FERRITIN:2,TIBC:2,IRON:2,RETICCTPCT:2 in the last 168 hours  Micro Results: Recent Results (from the past 240 hour(s))  URINE CULTURE     Status: Normal   Collection Time   03/17/12  9:03 PM      Component Value Range Status Comment   Specimen Description URINE, RANDOM   Final    Special Requests ADDED 0538 03/18/12   Final    Culture  Setup Time 03/18/2012 06:12   Final    Colony Count 20,OOO COLONIES/ML   Final    Culture     Final    Value: Multiple bacterial morphotypes present, none predominant. Suggest appropriate  recollection if clinically indicated.   Report Status 03/19/2012 FINAL   Final   MRSA PCR SCREENING     Status: Normal   Collection Time   03/18/12  1:25 AM      Component Value Range Status Comment   MRSA by PCR NEGATIVE  NEGATIVE Final     Studies/Results: No results found.  Medications: I have reviewed the patient's current medications. Scheduled Meds:   . aspirin  324 mg Oral Pre-Cath  . aspirin EC  81 mg Oral Daily  . atenolol  25 mg Oral Daily  . diphenhydrAMINE  25 mg Intravenous On Call  . famotidine (PEPCID) IV  20 mg Intravenous On Call  . fentaNYL      . heparin      . heparin      . lisinopril  10 mg Oral Daily   And  . hydrochlorothiazide  12.5 mg Oral Daily  . insulin aspart  0-20 Units Subcutaneous TID WC  . insulin aspart  0-5 Units Subcutaneous QHS  . insulin glargine  30 Units  Subcutaneous QHS  . lidocaine      . lidocaine      . midazolam      . nitroGLYCERIN      . pantoprazole  40 mg Oral BID AC  . predniSONE  60 mg Oral Pre-Cath  . simvastatin  40 mg Oral QHS  . sodium chloride  10-40 mL Intracatheter Q12H  . verapamil      . DISCONTD: diphenhydrAMINE  25 mg Intravenous On Call  . DISCONTD: famotidine (PEPCID) IV  20 mg Intravenous On Call  . DISCONTD: predniSONE  60 mg Oral Pre-Cath  . DISCONTD: sodium chloride  3 mL Intravenous Q12H   Continuous Infusions:   . sodium chloride 1 mL/kg/hr (03/19/12 1526)  . DISCONTD: sodium chloride 1 mL/kg/hr (03/19/12 0351)   PRN Meds:.alum & mag hydroxide-simeth, diazepam, morphine, morphine injection, nitroGLYCERIN, ondansetron (ZOFRAN) IV, ondansetron, sodium chloride, DISCONTD: sodium chloride, DISCONTD: sodium chloride  Assessment/Plan: Chest pain syndrome  Patient had cardiac catheter on 03/19/2012 which showed nonobstructive coronary disease, normal left ventricle function.  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  Patient has had multiple episodes of left-sided pain that radiates to the left jaw on the left arm in each admission has revealed a negative workup in the past.  Will order d-dimer in the morning, if positive will need further evaluation (consider V/Q given recent cardiac cath), low index of suspicion that patient has pulmonary embolism at this time.  DM (diabetes mellitus), type 2, uncontrolled  Lantus not given the morning.  Uncontrolled blood sugars.  Continue Lantus and sliding scale insulin.  Hemoglobin A1c 11.4, suggesting an average blood sugar of 280.   Diarrhea-rule out C. difficile colitis  Stool for C. difficile colitis is pending, the patient at the present time is not experiencing any further  diarrhea.  Hypertension, benign  Stable.  Continue home medications of atenolol, lisinopril, and hydrochlorothiazide.  Nausea and vomiting in adult with history of recurrent abdominal pain/negative sigmoidoscopy and EGD 2008  Patient has a history of recurrent GI symptoms with negative workup in the past. Suspect she may have underlying gastroparesis due to poorly controlled diabetes, if not improving may consider gastric emptying study in patient.  Also instructed the patient to minimize her opiates. May  need outpatient GI evaluation.  Continue pantoprazole.  Start Carafate scheduled instead of when necessary.  Currently on clear liquid diet, advance as tolerated.  Anxiety and depression with documented history of narcotic seeking behavior (2009)/ Fibromyalgia  Follow closely and avoid narcotics when possible. On Valium 10 mg twice a day when necessary at home.  Hyperlipemia, mixed  Continue simvastatin.    Sinus node dysfunction  Prior pacemaker implantation.  Morbid obesity, BMI 35.1  Diet and exercise as outpatient.  Disposition  Transfer to telemetry.   LOS: 2 days  Berneita Sanagustin A, MD 03/19/2012, 5:20 PM

## 2012-03-19 NOTE — Progress Notes (Signed)
Inpatient Diabetes Program Recommendations  AACE/ADA: New Consensus Statement on Inpatient Glycemic Control (2009)  Target Ranges:  Prepandial:   less than 140 mg/dL      Peak postprandial:   less than 180 mg/dL (1-2 hours)      Critically ill patients:  140 - 180 mg/dL   Results for JAIDEE, STIPE (MRN [id number]) as of 03/19/2012 16:21  Ref. Range 03/18/2012 16:31 03/18/2012 20:52 03/19/2012 07:06 03/19/2012 11:15 03/19/2012 13:35  Glucose-Capillary Latest Range: 70-99 mg/dL 518 (H) 841 (H) 660 (H) 330 (H) 267 (H)   Patient only received 1/2 dose Lantus last p.m. Since NPO after MN.  Will hopefully look better in the a.m. After full dose is given. Will follow.  Thank you  Piedad Climes [facility] Inpatient Diabetes Coordinator [phone]

## 2012-03-19 NOTE — Interval H&P Note (Signed)
History and Physical Interval Note:  03/19/2012 12:01 PM  [name]  has presented today for surgery, with the diagnosis of Chest pain  The various methods of treatment have been discussed with the patient and family. After consideration of risks, benefits and other options for treatment, the patient has consented to  Procedure(s) (LRB): LEFT HEART CATHETERIZATION WITH CORONARY ANGIOGRAM (N/A) as a surgical intervention .  The patient's history has been reviewed, patient examined, no change in status, stable for surgery.  I have reviewed the patients' chart and labs.  Questions were answered to the patient's satisfaction.     Theron Arista [facility] 03/19/2012 12:01 PM

## 2012-03-19 NOTE — H&P (View-Only) (Signed)
 Subjective:  Chest pain continues essentially unchanged overnight. Complains of severe itching from the benadryl. Awaiting cath today  Objective:  Vital Signs in the last 24 hours: Temp:  [97.2 F (36.2 C)-98.4 F (36.9 C)] 97.2 F (36.2 C) (07/02 0504) Pulse Rate:  [64-74] 64  (07/02 0504) Resp:  [19-20] 19  (07/02 0504) BP: (109-149)/(65-88) 109/65 mmHg (07/02 0504) SpO2:  [90 %-94 %] 91 % (07/02 0504) Weight:  [210 lb 5.1 oz (95.4 kg)-212 lb 4.9 oz (96.3 kg)] 212 lb 4.9 oz (96.3 kg) (07/02 0249)  Intake/Output from previous day: 07/01 0701 - 07/02 0700 In: 1640 [P.O.:1090; I.V.:450] Out: -  Intake/Output from this shift:       . aspirin  324 mg Oral Pre-Cath  . aspirin EC  81 mg Oral Daily  . atenolol  25 mg Oral Daily  . diphenhydrAMINE  25 mg Intravenous On Call  . famotidine (PEPCID) IV  20 mg Intravenous On Call  . lisinopril  10 mg Oral Daily   And  . hydrochlorothiazide  12.5 mg Oral Daily  . insulin aspart  0-20 Units Subcutaneous TID WC  . insulin aspart  0-5 Units Subcutaneous QHS  . insulin glargine  30 Units Subcutaneous QHS  . pantoprazole  40 mg Oral BID AC  . predniSONE  60 mg Oral Pre-Cath  . simvastatin  40 mg Oral QHS  . sodium chloride  10-40 mL Intracatheter Q12H  . sodium chloride  3 mL Intravenous Q12H  . DISCONTD: diphenhydrAMINE  25 mg Intravenous On Call  . DISCONTD: famotidine (PEPCID) IV  20 mg Intravenous On Call  . DISCONTD: insulin aspart  0-9 Units Subcutaneous TID WC  . DISCONTD: insulin glargine  25 Units Subcutaneous QHS  . DISCONTD: nitroGLYCERIN  0.5 inch Topical Q6H  . DISCONTD: predniSONE  60 mg Oral Pre-Cath      . sodium chloride    . sodium chloride 1 mL/kg/hr (03/19/12 0351)    Physical Exam: The patient appears to be in no distress.  Head and neck exam reveals that the pupils are equal and reactive.  The extraocular movements are full.  There is no scleral icterus.  Mouth and pharynx are benign.  No  lymphadenopathy.  No carotid bruits.  The jugular venous pressure is normal.  Thyroid is not enlarged or tender.  Chest is clear to percussion and auscultation.  No rales or rhonchi.  Expansion of the chest is symmetrical.  Heart reveals no abnormal lift or heave.  First and second heart sounds are normal.  There is no murmur gallop rub or click.  Abdomen soft, soft bowel sounds, no localizing tenderness.  Extremities reveal no phlebitis or edema.  Pedal pulses are good.  There is no cyanosis or clubbing.  Neurologic exam is normal strength and no lateralizing weakness.  No sensory deficits.  Integument reveals no rash  Lab Results:  Basename 03/19/12 0514 03/18/12 0500  WBC 11.0* 8.3  HGB 12.0 13.8  PLT 268 272    Basename 03/19/12 0514 03/18/12 0500  NA 135 137  K 4.7 3.9  CL 99 98  CO2 26 23  GLUCOSE 356* 227*  BUN 10 8  CREATININE 0.73 0.62    Basename 03/18/12 2023 03/18/12 1340  TROPONINI <0.30 <0.30   Hepatic Function Panel  Basename 03/17/12 1753  PROT 7.6  ALBUMIN 3.5  AST 18  ALT 15  ALKPHOS 210*  BILITOT 0.2*  BILIDIR <0.1  IBILI NOT CALCULATED   No results   found for this basename: CHOL in the last 72 hours No results found for this basename: PROTIME in the last 72 hours  Imaging: Imaging results have been reviewed  Cardiac Studies: Telemetry NSR Assessment/Plan:  Ongoing chest pain requiring opiates.    Plan: Cath today.    LOS: 2 days    Zoanne Newill 03/19/2012, 8:39 AM    

## 2012-03-19 NOTE — Progress Notes (Signed)
Subjective:  Chest pain continues essentially unchanged overnight. Complains of severe itching from the benadryl. Awaiting cath today  Objective:  Vital Signs in the last 24 hours: Temp:  [97.2 F (36.2 C)-98.4 F (36.9 C)] 97.2 F (36.2 C) (07/02 0504) Pulse Rate:  [64-74] 64  (07/02 0504) Resp:  [19-20] 19  (07/02 0504) BP: (109-149)/(65-88) 109/65 mmHg (07/02 0504) SpO2:  [90 %-94 %] 91 % (07/02 0504) Weight:  [210 lb 5.1 oz (95.4 kg)-212 lb 4.9 oz (96.3 kg)] 212 lb 4.9 oz (96.3 kg) (07/02 0249)  Intake/Output from previous day: 07/01 0701 - 07/02 0700 In: 1640 [P.O.:1090; I.V.:450] Out: -  Intake/Output from this shift:       . aspirin  324 mg Oral Pre-Cath  . aspirin EC  81 mg Oral Daily  . atenolol  25 mg Oral Daily  . diphenhydrAMINE  25 mg Intravenous On Call  . famotidine (PEPCID) IV  20 mg Intravenous On Call  . lisinopril  10 mg Oral Daily   And  . hydrochlorothiazide  12.5 mg Oral Daily  . insulin aspart  0-20 Units Subcutaneous TID WC  . insulin aspart  0-5 Units Subcutaneous QHS  . insulin glargine  30 Units Subcutaneous QHS  . pantoprazole  40 mg Oral BID AC  . predniSONE  60 mg Oral Pre-Cath  . simvastatin  40 mg Oral QHS  . sodium chloride  10-40 mL Intracatheter Q12H  . sodium chloride  3 mL Intravenous Q12H  . DISCONTD: diphenhydrAMINE  25 mg Intravenous On Call  . DISCONTD: famotidine (PEPCID) IV  20 mg Intravenous On Call  . DISCONTD: insulin aspart  0-9 Units Subcutaneous TID WC  . DISCONTD: insulin glargine  25 Units Subcutaneous QHS  . DISCONTD: nitroGLYCERIN  0.5 inch Topical Q6H  . DISCONTD: predniSONE  60 mg Oral Pre-Cath      . sodium chloride    . sodium chloride 1 mL/kg/hr (03/19/12 0351)    Physical Exam: The patient appears to be in no distress.  Head and neck exam reveals that the pupils are equal and reactive.  The extraocular movements are full.  There is no scleral icterus.  Mouth and pharynx are benign.  No  lymphadenopathy.  No carotid bruits.  The jugular venous pressure is normal.  Thyroid is not enlarged or tender.  Chest is clear to percussion and auscultation.  No rales or rhonchi.  Expansion of the chest is symmetrical.  Heart reveals no abnormal lift or heave.  First and second heart sounds are normal.  There is no murmur gallop rub or click.  Abdomen soft, soft bowel sounds, no localizing tenderness.  Extremities reveal no phlebitis or edema.  Pedal pulses are good.  There is no cyanosis or clubbing.  Neurologic exam is normal strength and no lateralizing weakness.  No sensory deficits.  Integument reveals no rash  Lab Results:  Basename 03/19/12 0514 03/18/12 0500  WBC 11.0* 8.3  HGB 12.0 13.8  PLT 268 272    Basename 03/19/12 0514 03/18/12 0500  NA 135 137  K 4.7 3.9  CL 99 98  CO2 26 23  GLUCOSE 356* 227*  BUN 10 8  CREATININE 0.73 0.62    Basename 03/18/12 2023 03/18/12 1340  TROPONINI <0.30 <0.30   Hepatic Function Panel  Basename 03/17/12 1753  PROT 7.6  ALBUMIN 3.5  AST 18  ALT 15  ALKPHOS 210*  BILITOT 0.2*  BILIDIR <0.1  IBILI NOT CALCULATED   No results  found for this basename: CHOL in the last 72 hours No results found for this basename: PROTIME in the last 72 hours  Imaging: Imaging results have been reviewed  Cardiac Studies: Telemetry NSR Assessment/Plan:  Ongoing chest pain requiring opiates.    Plan: Cath today.    LOS: 2 days    Cassell Clement 03/19/2012, 8:39 AM

## 2012-03-20 DIAGNOSIS — E119 Type 2 diabetes mellitus without complications: Secondary | ICD-10-CM

## 2012-03-20 LAB — BASIC METABOLIC PANEL
Chloride: 100 mEq/L (ref 96–112)
GFR calc Af Amer: 78 mL/min — ABNORMAL LOW (ref >90)
Potassium: 4 mEq/L (ref 3.5–5.1)
Sodium: 139 mEq/L (ref 135–145)

## 2012-03-20 LAB — CBC
Platelets: 281 10*3/uL (ref 150–400)
RDW: 14.9 % (ref 11.5–15.5)
WBC: 10.5 10*3/uL (ref 4.0–10.5)

## 2012-03-20 LAB — D-DIMER, QUANTITATIVE: D-Dimer, Quant: 1.19 ug/mL-FEU — ABNORMAL HIGH (ref 0.00–0.48)

## 2012-03-20 LAB — CLOSTRIDIUM DIFFICILE BY PCR: Toxigenic C. Difficile by PCR: NEGATIVE

## 2012-03-20 MED ORDER — SUCRALFATE 1 GM/10ML PO SUSP: 1.0000 g | Freq: Three times a day (TID) | ORAL | Status: DC

## 2012-03-20 MED ORDER — INSULIN GLARGINE 100 UNIT/ML ~~LOC~~ SOLN: 30.0000 [IU] | Freq: Every day | SUBCUTANEOUS | Status: DC

## 2012-03-20 NOTE — Discharge Summary (Signed)
DISCHARGE SUMMARY  LADAIJA DIMINO  MR#: [id number]  DOB:1951/07/23  Date of Admission: 03/17/2012 Date of Discharge: 03/20/2012  Attending Physician:Tula Schryver Silvestre Gunner, MD  Patient's WUJ:[id number], Myrene Galas, MD  Consults: Maisie Fus Brackbill/Cardiology  Pertinent discharge information: She underwent a cardiac catheterization this admission that showed nonobstructive minimal cardiac disease and therefore noncardiac etiology to her chest pain.  Hemoglobin A1c was elevated at 11.4 and suspect gastroparesis as primary etiology to patient's chest pain and other GI symptomatology. This was discussed with the patient and she may benefit from an outpatient evaluation including a nuclear medicine gastric emptying study in the future.  Lantus dose was increased from 25 to 30 units this admission.  Because of her GI symptomatology her Carafate was changed from "as needed" to regular dosing schedule.  Discharge Diagnoses: Principal Problem:  *Chest pain syndrome, status post cardiac catheterization with nonobstructive disease-suspect GI etiology Active Problems:  DM (diabetes mellitus), type 2, uncontrolled  Diarrhea due to drug-rule out C. difficile colitis  Hypertension, benign  Nausea and vomiting in adult with history of recurrent abdominal pain/negative sigmoidoscopy and EGD 2008  Anxiety and depression with documented history of narcotic seeking behavior (2009)  Hyperlipemia, mixed  Sinus node dysfunction  Morbid obesity, BMI not known  Fibromyalgia  Nausea and vomiting-suspect diabetic gastroparesis   Radiology: Dg Chest 2 View  03/17/2012  *RADIOLOGY REPORT*  Clinical Data: Chest pain  CHEST - 2 VIEW  Comparison: 03/09/2012  Findings: There is a left chest wall pacer device with lead in the right atrial appendage and right ventricle.  Heart size is normal.  No pleural effusion or edema.  Asymmetric elevation of the right hemidiaphragm is noted.  There is plate-like atelectasis noted in the  right base.  IMPRESSION:  1.  Right base atelectasis.  Original Report Authenticated By: Rosealee Albee, M.D.    Laboratory: CARDIAC PANEL(CRET KIN+CKTOT+MB+TROPI)     Status: Normal   Collection Time   03/18/12  1:40 PM      Component Value Range Comment   Total CK 40  7 - 177 U/L    CK, MB 1.4  0.3 - 4.0 ng/mL    Troponin I <0.30  <0.30 ng/mL    Relative Index RELATIVE INDEX IS INVALID  0.0 - 2.5   HEMOGLOBIN A1C     Status: Abnormal   Collection Time   03/18/12  1:40 PM      Component Value Range Comment   Hemoglobin A1C 11.4 (*) <5.7 %    Mean Plasma Glucose 280 (*) <117 mg/dL   CARDIAC PANEL(CRET KIN+CKTOT+MB+TROPI)     Status: Normal   Collection Time   03/18/12  8:23 PM      Component Value Range Comment   Total CK 44  7 - 177 U/L    CK, MB 1.6  0.3 - 4.0 ng/mL    Troponin I <0.30  <0.30 ng/mL    Relative Index RELATIVE INDEX IS INVALID  0.0 - 2.5   CBC     Status: Abnormal   Collection Time   03/19/12  5:14 AM      Component Value Range Comment   WBC 11.0 (*) 4.0 - 10.5 K/uL    RBC 4.31  3.87 - 5.11 MIL/uL    Hemoglobin 12.0  12.0 - 15.0 g/dL    HCT 47.8  29.5 - 62.1 %    MCV 86.3  78.0 - 100.0 fL    MCH 27.8  26.0 - 34.0 pg  MCHC 32.3  30.0 - 36.0 g/dL    RDW 40.9  81.1 - 91.4 %    Platelets 268  150 - 400 K/uL   BASIC METABOLIC PANEL     Status: Abnormal   Collection Time   03/19/12  5:14 AM      Component Value Range Comment   Sodium 135  135 - 145 mEq/L    Potassium 4.7  3.5 - 5.1 mEq/L DELTA CHECK NOTED   Chloride 99  96 - 112 mEq/L    CO2 26  19 - 32 mEq/L    Glucose, Bld 356 (*) 70 - 99 mg/dL    BUN 10  6 - 23 mg/dL    Creatinine, Ser 7.82  0.50 - 1.10 mg/dL    Calcium 8.4  8.4 - 95.6 mg/dL    GFR calc non Af Amer >90  >90 mL/min    GFR calc Af Amer >90  >90 mL/min   CBC     Status: Abnormal   Collection Time   03/20/12  4:24 AM      Component Value Range Comment   WBC 10.5  4.0 - 10.5 K/uL    RBC 4.17  3.87 - 5.11 MIL/uL    Hemoglobin 11.3 (*) 12.0 -  15.0 g/dL    HCT 21.3  08.6 - 57.8 %    MCV 87.3  78.0 - 100.0 fL    MCH 27.1  26.0 - 34.0 pg    MCHC 31.0  30.0 - 36.0 g/dL    RDW 46.9  62.9 - 52.8 %    Platelets 281  150 - 400 K/uL   BASIC METABOLIC PANEL     Status: Abnormal   Collection Time   03/20/12  4:24 AM      Component Value Range Comment   Sodium 139  135 - 145 mEq/L    Potassium 4.0  3.5 - 5.1 mEq/L    Chloride 100  96 - 112 mEq/L    CO2 27  19 - 32 mEq/L    Glucose, Bld 237 (*) 70 - 99 mg/dL    BUN 15  6 - 23 mg/dL    Creatinine, Ser 4.13  0.50 - 1.10 mg/dL    Calcium 8.7  8.4 - 24.4 mg/dL    GFR calc non Af Amer 68 (*) >90 mL/min    GFR calc Af Amer 78 (*) >90 mL/min   D-DIMER, QUANTITATIVE     Status: Abnormal   Collection Time   03/20/12  4:24 AM      Component Value Range Comment   D-Dimer, Quant 1.19 (*) 0.00 - 0.48 ug/mL-FEU     Medication List  As of 03/20/2012 11:33 AM   TAKE these medications         aspirin 81 MG tablet   Take 81 mg by mouth daily.      atenolol 25 MG tablet   Commonly known as: TENORMIN   Take 25 mg by mouth daily.      diazepam 10 MG tablet   Commonly known as: VALIUM   Take 10 mg by mouth 2 (two) times daily as needed. For anxiety      insulin aspart 100 UNIT/ML injection   Commonly known as: novoLOG   Inject 0-10 Units into the skin 3 (three) times daily before meals. Based on Sliding scale      insulin glargine 100 UNIT/ML injection   Commonly known as: LANTUS   Inject 30 Units into  the skin at bedtime.      lisinopril-hydrochlorothiazide 10-12.5 MG per tablet   Commonly known as: PRINZIDE,ZESTORETIC   Take 1 tablet by mouth daily.      metFORMIN 850 MG tablet   Commonly known as: GLUCOPHAGE   Take 850 mg by mouth 2 (two) times daily with a meal.      nitroGLYCERIN 0.4 MG SL tablet   Commonly known as: NITROSTAT   Place 0.4 mg under the tongue every 5 (five) minutes as needed. For chest pain.      pantoprazole 40 MG tablet   Commonly known as: PROTONIX   Take 40  mg by mouth 2 (two) times daily before a meal.      simvastatin 40 MG tablet   Commonly known as: ZOCOR   Take 40 mg by mouth at bedtime.      sucralfate 1 GM/10ML suspension   Commonly known as: CARAFATE   Take 10 mLs (1 g total) by mouth 4 (four) times daily -  with meals and at bedtime.           History present illness:  61 year old female patient presented to the ER after experiencing chest pain on the morning of admission. This was associated with nausea, vomiting and diarrhea. Patient endorses recent treatment for urinary tract infection with antibiotic. No fevers at present. In regard to the chest pain, patient describes this as being pressure-like radiating down the left arm and up the left jaw. Patient reported to the admitting physician she had had recurrent chest pain over the past several years with a normal cardiac catheterization around 5 years prior. The catheterization was done prior to pacemaker placement for symptomatic bradycardia. Patient had a recent hospitalization in December 2012 for chest pain and had a normal Myoview stress test at that time. In regards to her GI symptoms her emesis has been nonbloody and she describes her diarrhea as watery and foul smelling. She states she has had C. difficile before and she feels she has C. difficile colitis. In regards to her chest pain it was not relieved with nitroglycerin but did improve with IV morphine. She denied abdominal pain. On examination by the admitting physician her chest pain was slightly reproducible with palpation. Her initial physical exam was otherwise unremarkable. Admitting laboratory data demonstrated mild hyponatremia with a sodium of 131 but this is in the setting of hyperglycemia with a glucose of 355. She had mild leukocytosis with a white count of 11,200 with absolute neutrophils 7900. Initial chest x-ray was unremarkable except for mild right basilar atelectasis. Her initial EKG was nonischemic. She was  admitted to the step down unit with ongoing chest pain.  Hospital Course: Principal Problem:  *Chest pain syndrome-noncardiac Active Problems:  DM (diabetes mellitus), type 2, uncontrolled  Diarrhea due to drug-rule out C. difficile colitis  Hypertension, benign  Nausea and vomiting in adult with history of recurrent abdominal pain/negative sigmoidoscopy and EGD 2008  Anxiety and depression with documented history of narcotic seeking behavior (2009)  Hyperlipemia, mixed  Sinus node dysfunction  Morbid obesity, BMI not known  Fibromyalgia  Nausea and vomiting-likely related to gastroparesis  Chest pain syndrome  After admission patient continued to complain of near continuous left-sided chest pain and pressure that radiated into the jaw and ear region as well as down to the left arm that was not relieved with nitroglycerin but seemed to improve with the addition of narcotic pain medication. Because of her risk factors including uncontrolled diabetes cardiology  was consulted.Because of her risk factor stratification pattern cardiac catheterization was opted for. She subsequently underwent a cardiac catheterization on 03/19/2012 that demonstrated nonobstructive coronary disease and normal LV function. No further workup from a cardiac standpoint is indicated by the cardiology team.  DM (diabetes mellitus), type 2, uncontrolled  At presentation patient did have hyperglycemia and subsequent hemoglobin A1c was 11.4 which is suggestive of an average blood sugar of 280. Prior to admission patient was on Lantus and Glucophage which we'll continue after discharge but we will go ahead and continue the higher dose of Lantus given here which is 30 units at hour of sleep. Need for adequate control of diabetes and blood sugars was discussed with this patient per Dr. Dejah Seller. She will be given multiple educational materials regarding diabetes as well as diabetic management.  Diarrhea-rule out C. difficile  colitis  Patient self-reported diarrhea at home and she suspected this was related to recent antibiotic treatment. Patient had no further diarrhea after admission. Stool for C. difficile has been obtained but is pending at time of discharge. Her abdominal pain apparently has resolved and we have a low index of suspicion that she has C. difficile colitis.  Mildly elevated d-dimer Because her chest pain workup eliminated a cardiac etiology, a d-dimer was obtained to rule out a possible pulmonary embolism. This was mildly elevated at 1.19. Four months ago d-dimer was mildly elevated at 0.70 and 5 months ago 0.95 and 7 months ago 1.09 at which time she underwent a VQ scan which demonstrated low probability for pulmonary embolism. During this hospitalization her physical exam is not consistent with a PE. She is not hypoxic. She's not had any lower extremity edema or calf pain that would make [place] suspicious for lower extremity DVT therefore we feel this d-dimer result is not indicative of a pulmonary embolism and we feel there is little to be gained by a repeat radiation exposure/CT angiogram chest.  Nausea and vomiting in adult with history of recurrent abdominal pain/negative sigmoidoscopy and EGD 2008  Patient has had nonspecific gastrointestinal symptoms this admission. She endorses persistent nausea but has been able to tolerate a diet since admission. It is suspected that she likely has underlying gastroparesis due to poorly controlled diabetes. Recommendation is to further work this up as an outpatient by pursuing a nuclear medicine gastric imaging study. Patient was on regular Protonix prior to admission which was continued. She was taking Carafate as needed at home but we have changed this to scheduled dosing. It is also likely that her chest pain is related to gastroparesis which is possibly causing significant reflux and irritating her distal esophagus.  Anxiety and depression with documented history of  narcotic seeking behavior (2009)/ Fibromyalgia Her usual home medication of Valium was continued after admission. During this admission another provider did prescribe short acting morphine for her abdominal pain but this was not continued at time of discharge given the fact we are concerned that many for GI symptoms are related to gastroparesis and narcotics will only exacerbate this condition.  Hypertension, benign Blood pressure remained well-controlled this hospitalization and she was continued on her usual home medications throughout the hospitalization.  Morbid obesity, BMI 35.1  Diet and exercise have been recommended as an outpatient.  Poor IV access As was the case and prior hospitalizations this patient did require PICC line placement this admission. This line was removed prior to discharge.  Day of Discharge BP 108/65  Pulse 64  Temp 98 F (36.7 C) (  Oral)  Resp 20  Ht 5\' 5"  (1.651 m)  Wt 98.4 kg (216 lb 14.9 oz)  BMI 36.10 kg/m2  SpO2 98%  LMP 09/26/2011  Physical Exam:  General appearance: alert, cooperative, appears stated age and no distress Resp: clear to auscultation bilaterally Cardio: regular rate and rhythm, S1, S2 normal, no murmur, click, rub or gallop, no cyanosis or edema GI: soft, non-tender; bowel sounds normal; no masses,  no organomegaly Musculoskeletal: extremities normal, atraumatic Neurologic: Grossly normal  Follow-up: She is to call her primary care physician as well as her primary cardiologist to arrange for routine posthospitalization followup in the next 2 weeks. Please see the after visit summary provided to the patient.  Disposition:  Discharge to home  Junious Silk, ANP pager [phone]  I have personally examined this patient and reviewed the entire database. I have reviewed the above note, made any necessary editorial changes, and agree with its content.  Lonia Blood, MD [facility]

## 2012-03-20 NOTE — Progress Notes (Deleted)
DISCHARGE SUMMARY  [name]  MR#: [id number]  DOB:02/02/1951  Date of Admission: 03/17/2012 Date of Discharge: 03/20/2012  Attending Physician:Neleh Muldoon T Demarian Epps, MD  Patient's PCP:HAGUE, IMRAN P, MD  Consults: Thomas Brackbill/Cardiology  Pertinent discharge information: She underwent a cardiac catheterization this admission that showed nonobstructive minimal cardiac disease and therefore noncardiac etiology to her chest pain.  Hemoglobin A1c was elevated at 11.4 and suspect gastroparesis as primary etiology to patient's chest pain and other GI symptomatology. This was discussed with the patient and she may benefit from an outpatient evaluation including a nuclear medicine gastric emptying study in the future.  Lantus dose was increased from 25 to 30 units this admission.  Because of her GI symptomatology her Carafate was changed from "as needed" to regular dosing schedule.  Discharge Diagnoses: Principal Problem:  *Chest pain syndrome, status post cardiac catheterization with nonobstructive disease-suspect GI etiology Active Problems:  DM (diabetes mellitus), type 2, uncontrolled  Diarrhea due to drug-rule out C. difficile colitis  Hypertension, benign  Nausea and vomiting in adult with history of recurrent abdominal pain/negative sigmoidoscopy and EGD 2008  Anxiety and depression with documented history of narcotic seeking behavior (2009)  Hyperlipemia, mixed  Sinus node dysfunction  Morbid obesity, BMI not known  Fibromyalgia  Nausea and vomiting-suspect diabetic gastroparesis   Radiology: Dg Chest 2 View  03/17/2012  *RADIOLOGY REPORT*  Clinical Data: Chest pain  CHEST - 2 VIEW  Comparison: 03/09/2012  Findings: There is a left chest wall pacer device with lead in the right atrial appendage and right ventricle.  Heart size is normal.  No pleural effusion or edema.  Asymmetric elevation of the right hemidiaphragm is noted.  There is plate-like atelectasis noted in the  right base.  IMPRESSION:  1.  Right base atelectasis.  Original Report Authenticated By: TAYLOR H. STROUD, M.D.    Laboratory: CARDIAC PANEL(CRET KIN+CKTOT+MB+TROPI)     Status: Normal   Collection Time   03/18/12  1:40 PM      Component Value Range Comment   Total CK 40  7 - 177 U/Michaela    CK, MB 1.4  0.3 - 4.0 ng/mL    Troponin I <0.30  <0.30 ng/mL    Relative Index RELATIVE INDEX IS INVALID  0.0 - 2.5   HEMOGLOBIN A1C     Status: Abnormal   Collection Time   03/18/12  1:40 PM      Component Value Range Comment   Hemoglobin A1C 11.4 (*) <5.7 %    Mean Plasma Glucose 280 (*) <117 mg/dL   CARDIAC PANEL(CRET KIN+CKTOT+MB+TROPI)     Status: Normal   Collection Time   03/18/12  8:23 PM      Component Value Range Comment   Total CK 44  7 - 177 U/Michaela    CK, MB 1.6  0.3 - 4.0 ng/mL    Troponin I <0.30  <0.30 ng/mL    Relative Index RELATIVE INDEX IS INVALID  0.0 - 2.5   CBC     Status: Abnormal   Collection Time   03/19/12  5:14 AM      Component Value Range Comment   WBC 11.0 (*) 4.0 - 10.5 K/uL    RBC 4.31  3.87 - 5.11 MIL/uL    Hemoglobin 12.0  12.0 - 15.0 g/dL    HCT 37.2  36.0 - 46.0 %    MCV 86.3  78.0 - 100.0 fL    MCH 27.8  26.0 - 34.0 pg      MCHC 32.3  30.0 - 36.0 g/dL    RDW 14.8  11.5 - 15.5 %    Platelets 268  150 - 400 K/uL   BASIC METABOLIC PANEL     Status: Abnormal   Collection Time   03/19/12  5:14 AM      Component Value Range Comment   Sodium 135  135 - 145 mEq/Michaela    Potassium 4.7  3.5 - 5.1 mEq/Michaela DELTA CHECK NOTED   Chloride 99  96 - 112 mEq/Michaela    CO2 26  19 - 32 mEq/Michaela    Glucose, Bld 356 (*) 70 - 99 mg/dL    BUN 10  6 - 23 mg/dL    Creatinine, Ser 0.73  0.50 - 1.10 mg/dL    Calcium 8.4  8.4 - 10.5 mg/dL    GFR calc non Af Amer >90  >90 mL/min    GFR calc Af Amer >90  >90 mL/min   CBC     Status: Abnormal   Collection Time   03/20/12  4:24 AM      Component Value Range Comment   WBC 10.5  4.0 - 10.5 K/uL    RBC 4.17  3.87 - 5.11 MIL/uL    Hemoglobin 11.3 (*) 12.0 -  15.0 g/dL    HCT 36.4  36.0 - 46.0 %    MCV 87.3  78.0 - 100.0 fL    MCH 27.1  26.0 - 34.0 pg    MCHC 31.0  30.0 - 36.0 g/dL    RDW 14.9  11.5 - 15.5 %    Platelets 281  150 - 400 K/uL   BASIC METABOLIC PANEL     Status: Abnormal   Collection Time   03/20/12  4:24 AM      Component Value Range Comment   Sodium 139  135 - 145 mEq/Michaela    Potassium 4.0  3.5 - 5.1 mEq/Michaela    Chloride 100  96 - 112 mEq/Michaela    CO2 27  19 - 32 mEq/Michaela    Glucose, Bld 237 (*) 70 - 99 mg/dL    BUN 15  6 - 23 mg/dL    Creatinine, Ser 0.90  0.50 - 1.10 mg/dL    Calcium 8.7  8.4 - 10.5 mg/dL    GFR calc non Af Amer 68 (*) >90 mL/min    GFR calc Af Amer 78 (*) >90 mL/min   D-DIMER, QUANTITATIVE     Status: Abnormal   Collection Time   03/20/12  4:24 AM      Component Value Range Comment   D-Dimer, Quant 1.19 (*) 0.00 - 0.48 ug/mL-FEU     Medication List  As of 03/20/2012 11:33 AM   TAKE these medications         aspirin 81 MG tablet   Take 81 mg by mouth daily.      atenolol 25 MG tablet   Commonly known as: TENORMIN   Take 25 mg by mouth daily.      diazepam 10 MG tablet   Commonly known as: VALIUM   Take 10 mg by mouth 2 (two) times daily as needed. For anxiety      insulin aspart 100 UNIT/ML injection   Commonly known as: novoLOG   Inject 0-10 Units into the skin 3 (three) times daily before meals. Based on Sliding scale      insulin glargine 100 UNIT/ML injection   Commonly known as: LANTUS   Inject 30 Units into   the skin at bedtime.      lisinopril-hydrochlorothiazide 10-12.5 MG per tablet   Commonly known as: PRINZIDE,ZESTORETIC   Take 1 tablet by mouth daily.      metFORMIN 850 MG tablet   Commonly known as: GLUCOPHAGE   Take 850 mg by mouth 2 (two) times daily with a meal.      nitroGLYCERIN 0.4 MG SL tablet   Commonly known as: NITROSTAT   Place 0.4 mg under the tongue every 5 (five) minutes as needed. For chest pain.      pantoprazole 40 MG tablet   Commonly known as: PROTONIX   Take 40  mg by mouth 2 (two) times daily before a meal.      simvastatin 40 MG tablet   Commonly known as: ZOCOR   Take 40 mg by mouth at bedtime.      sucralfate 1 GM/10ML suspension   Commonly known as: CARAFATE   Take 10 mLs (1 g total) by mouth 4 (four) times daily -  with meals and at bedtime.           History present illness:  61-year-old female patient presented to the ER after experiencing chest pain on the morning of admission. This was associated with nausea, vomiting and diarrhea. Patient endorses recent treatment for urinary tract infection with antibiotic. No fevers at present. In regard to the chest pain, patient describes this as being pressure-like radiating down the left arm and up the left jaw. Patient reported to the admitting physician she had had recurrent chest pain over the past several years with a normal cardiac catheterization around 5 years prior. The catheterization was done prior to pacemaker placement for symptomatic bradycardia. Patient had a recent hospitalization in December 2012 for chest pain and had a normal Myoview stress test at that time. In regards to her GI symptoms her emesis has been nonbloody and she describes her diarrhea as watery and foul smelling. She states she has had C. difficile before and she feels she has C. difficile colitis. In regards to her chest pain it was not relieved with nitroglycerin but did improve with IV morphine. She denied abdominal pain. On examination by the admitting physician her chest pain was slightly reproducible with palpation. Her initial physical exam was otherwise unremarkable. Admitting laboratory data demonstrated mild hyponatremia with a sodium of 131 but this is in the setting of hyperglycemia with a glucose of 355. She had mild leukocytosis with a white count of 11,200 with absolute neutrophils 7900. Initial chest x-ray was unremarkable except for mild right basilar atelectasis. Her initial EKG was nonischemic. She was  admitted to the step down unit with ongoing chest pain.  Hospital Course: Principal Problem:  *Chest pain syndrome-noncardiac Active Problems:  DM (diabetes mellitus), type 2, uncontrolled  Diarrhea due to drug-rule out C. difficile colitis  Hypertension, benign  Nausea and vomiting in adult with history of recurrent abdominal pain/negative sigmoidoscopy and EGD 2008  Anxiety and depression with documented history of narcotic seeking behavior (2009)  Hyperlipemia, mixed  Sinus node dysfunction  Morbid obesity, BMI not known  Fibromyalgia  Nausea and vomiting-likely related to gastroparesis  Chest pain syndrome  After admission patient continued to complain of near continuous left-sided chest pain and pressure that radiated into the jaw and ear region as well as down to the left arm that was not relieved with nitroglycerin but seemed to improve with the addition of narcotic pain medication. Because of her risk factors including uncontrolled diabetes cardiology   was consulted.Because of her risk factor stratification pattern cardiac catheterization was opted for. She subsequently underwent a cardiac catheterization on 03/19/2012 that demonstrated nonobstructive coronary disease and normal LV function. No further workup from a cardiac standpoint is indicated by the cardiology team.  DM (diabetes mellitus), type 2, uncontrolled  At presentation patient did have hyperglycemia and subsequent hemoglobin A1c was 11.4 which is suggestive of an average blood sugar of 280. Prior to admission patient was on Lantus and Glucophage which we'll continue after discharge but we will go ahead and continue the higher dose of Lantus given here which is 30 units at hour of sleep. Need for adequate control of diabetes and blood sugars was discussed with this patient per Dr. Kaylanni Ezelle. She will be given multiple educational materials regarding diabetes as well as diabetic management.  Diarrhea-rule out C. difficile  colitis  Patient self-reported diarrhea at home and she suspected this was related to recent antibiotic treatment. Patient had no further diarrhea after admission. Stool for C. difficile has been obtained but is pending at time of discharge. Her abdominal pain apparently has resolved and we have a low index of suspicion that she has C. difficile colitis.  Mildly elevated d-dimer Because her chest pain workup eliminated a cardiac etiology, a d-dimer was obtained to rule out a possible pulmonary embolism. This was mildly elevated at 1.19. Four months ago d-dimer was mildly elevated at 0.70 and 5 months ago 0.95 and 7 months ago 1.09 at which time she underwent a VQ scan which demonstrated low probability for pulmonary embolism. During this hospitalization her physical exam is not consistent with a PE. She is not hypoxic. She's not had any lower extremity edema or calf pain that would make us suspicious for lower extremity DVT therefore we feel this d-dimer result is not indicative of a pulmonary embolism and we feel there is little to be gained by a repeat radiation exposure/CT angiogram chest.  Nausea and vomiting in adult with history of recurrent abdominal pain/negative sigmoidoscopy and EGD 2008  Patient has had nonspecific gastrointestinal symptoms this admission. She endorses persistent nausea but has been able to tolerate a diet since admission. It is suspected that she likely has underlying gastroparesis due to poorly controlled diabetes. Recommendation is to further work this up as an outpatient by pursuing a nuclear medicine gastric imaging study. Patient was on regular Protonix prior to admission which was continued. She was taking Carafate as needed at home but we have changed this to scheduled dosing. It is also likely that her chest pain is related to gastroparesis which is possibly causing significant reflux and irritating her distal esophagus.  Anxiety and depression with documented history of  narcotic seeking behavior (2009)/ Fibromyalgia Her usual home medication of Valium was continued after admission. During this admission another provider did prescribe short acting morphine for her abdominal pain but this was not continued at time of discharge given the fact we are concerned that many for GI symptoms are related to gastroparesis and narcotics will only exacerbate this condition.  Hypertension, benign Blood pressure remained well-controlled this hospitalization and she was continued on her usual home medications throughout the hospitalization.  Morbid obesity, BMI 35.1  Diet and exercise have been recommended as an outpatient.  Poor IV access As was the case and prior hospitalizations this patient did require PICC line placement this admission. This line was removed prior to discharge.  Day of Discharge BP 108/65  Pulse 64  Temp 98 F (36.7 C) (  Oral)  Resp 20  Ht 5' 5" (1.651 m)  Wt 98.4 kg (216 lb 14.9 oz)  BMI 36.10 kg/m2  SpO2 98%  LMP 09/26/2011  Physical Exam:  General appearance: alert, cooperative, appears stated age and no distress Resp: clear to auscultation bilaterally Cardio: regular rate and rhythm, S1, S2 normal, no murmur, click, rub or gallop, no cyanosis or edema GI: soft, non-tender; bowel sounds normal; no masses,  no organomegaly Musculoskeletal: extremities normal, atraumatic Neurologic: Grossly normal  Follow-up: She is to call her primary care physician as well as her primary cardiologist to arrange for routine posthospitalization followup in the next 2 weeks. Please see the after visit summary provided to the patient.  Disposition:  Discharge to home  Allison Ellis, ANP pager [phone]  I have personally examined this patient and reviewed the entire database. I have reviewed the above note, made any necessary editorial changes, and agree with its content.  Karalina Tift T. Samhitha Rosen, MD [facility]  

## 2012-03-20 NOTE — Progress Notes (Signed)
Subjective:  Feels better this am.  Looking forward to being discharged soon. Cardiac cath yesterday showed mild plaque but no obstructive disease to explain her symptoms. LV function is normal. Less pain last night.  Objective:  Vital Signs in the last 24 hours: Temp:  [96.8 F (36 C)-98.1 F (36.7 C)] 97.6 F (36.4 C) (07/03 0444) Pulse Rate:  [60-65] 60  (07/03 0444) Resp:  [11-22] 22  (07/03 0444) BP: (97-139)/(53-81) 108/65 mmHg (07/03 0444) SpO2:  [90 %-97 %] 97 % (07/03 0444) Weight:  [216 lb 14.9 oz (98.4 kg)] 216 lb 14.9 oz (98.4 kg) (07/03 0500)  Intake/Output from previous day: 07/02 0701 - 07/03 0700 In: 343.5 [I.V.:343.5] Out: -  Intake/Output from this shift:       . aspirin EC  81 mg Oral Daily  . atenolol  25 mg Oral Daily  . diphenhydrAMINE  25 mg Intravenous On Call  . famotidine (PEPCID) IV  20 mg Intravenous On Call  . fentaNYL      . heparin      . heparin      . lisinopril  10 mg Oral Daily   And  . hydrochlorothiazide  12.5 mg Oral Daily  . insulin aspart  0-20 Units Subcutaneous TID WC  . insulin aspart  0-5 Units Subcutaneous QHS  . insulin glargine  30 Units Subcutaneous QHS  . lidocaine      . lidocaine      . midazolam      . nitroGLYCERIN      . pantoprazole  40 mg Oral BID AC  . simvastatin  40 mg Oral QHS  . sucralfate  1 g Oral TID WC & HS  . verapamil      . DISCONTD: predniSONE  60 mg Oral Pre-Cath  . DISCONTD: sodium chloride  10-40 mL Intracatheter Q12H  . DISCONTD: sodium chloride  3 mL Intravenous Q12H      . sodium chloride 1 mL/kg/hr (03/19/12 1526)  . DISCONTD: sodium chloride 1 mL/kg/hr (03/19/12 0351)    Physical Exam: The patient appears to be in no distress.  Head and neck exam reveals that the pupils are equal and reactive.  The extraocular movements are full.  There is no scleral icterus.  Mouth and pharynx are benign.  No lymphadenopathy.  No carotid bruits.  The jugular venous pressure is normal.  Thyroid  is not enlarged or tender.  Chest is clear to percussion and auscultation.  No rales or rhonchi.  Expansion of the chest is symmetrical.  Heart reveals no abnormal lift or heave.  First and second heart sounds are normal.  There is no murmur gallop rub or click.  The abdomen is soft and nontender.  Bowel sounds are normoactive.  There is no hepatosplenomegaly or mass.  There are no abdominal bruits. Right wrist and right groin okay.  Extremities reveal no phlebitis or edema.  Pedal pulses are good.  There is no cyanosis or clubbing.  Neurologic exam is normal strength and no lateralizing weakness.  No sensory deficits.  Integument reveals no rash  Lab Results:  Basename 03/20/12 0424 03/19/12 0514  WBC 10.5 11.0*  HGB 11.3* 12.0  PLT 281 268    Basename 03/20/12 0424 03/19/12 0514  NA 139 135  K 4.0 4.7  CL 100 99  CO2 27 26  GLUCOSE 237* 356*  BUN 15 10  CREATININE 0.90 0.73    Basename 03/18/12 2023 03/18/12 1340  TROPONINI <0.30 <0.30  Hepatic Function Panel  Basename 03/17/12 1753  PROT 7.6  ALBUMIN 3.5  AST 18  ALT 15  ALKPHOS 210*  BILITOT 0.2*  BILIDIR <0.1  IBILI NOT CALCULATED   No results found for this basename: CHOL in the last 72 hours No results found for this basename: PROTIME in the last 72 hours  Imaging: Imaging results have been reviewed  Cardiac Studies:  Assessment/Plan:  Chest pain, noncardiac.  Rec:  No further cardiac tests planned.  Continue risk factor reduction, careful control of diabetes etc.  Okay for discharge from cardiac standpoint.  Will sign off now.   LOS: 3 days    Cassell Clement 03/20/2012, 7:37 AM

## 2012-03-24 LAB — STOOL CULTURE

## 2012-03-29 ENCOUNTER — Emergency Department (HOSPITAL_COMMUNITY)
Admission: EM | Admit: 2012-03-29 | Discharge: 2012-03-29 | Disposition: A | Discharge status: Home / Self Care | Payer: Medicare Other | Attending: Emergency Medicine | Admitting: Emergency Medicine

## 2012-03-29 ENCOUNTER — Emergency Department (HOSPITAL_COMMUNITY): Payer: Medicare Other

## 2012-03-29 ENCOUNTER — Encounter (HOSPITAL_COMMUNITY): Payer: Self-pay | Admitting: Emergency Medicine

## 2012-03-29 DIAGNOSIS — I10 Essential (primary) hypertension: Secondary | ICD-10-CM | POA: Insufficient documentation

## 2012-03-29 DIAGNOSIS — Z794 Long term (current) use of insulin: Secondary | ICD-10-CM | POA: Insufficient documentation

## 2012-03-29 DIAGNOSIS — E7800 Pure hypercholesterolemia, unspecified: Secondary | ICD-10-CM | POA: Insufficient documentation

## 2012-03-29 DIAGNOSIS — R739 Hyperglycemia, unspecified: Secondary | ICD-10-CM

## 2012-03-29 DIAGNOSIS — K209 Esophagitis, unspecified without bleeding: Secondary | ICD-10-CM | POA: Insufficient documentation

## 2012-03-29 DIAGNOSIS — R112 Nausea with vomiting, unspecified: Secondary | ICD-10-CM

## 2012-03-29 DIAGNOSIS — E1169 Type 2 diabetes mellitus with other specified complication: Secondary | ICD-10-CM | POA: Insufficient documentation

## 2012-03-29 DIAGNOSIS — Z79899 Other long term (current) drug therapy: Secondary | ICD-10-CM | POA: Insufficient documentation

## 2012-03-29 DIAGNOSIS — R079 Chest pain, unspecified: Secondary | ICD-10-CM

## 2012-03-29 DIAGNOSIS — I2510 Atherosclerotic heart disease of native coronary artery without angina pectoris: Secondary | ICD-10-CM | POA: Insufficient documentation

## 2012-03-29 DIAGNOSIS — K3184 Gastroparesis: Secondary | ICD-10-CM

## 2012-03-29 DIAGNOSIS — Z9089 Acquired absence of other organs: Secondary | ICD-10-CM | POA: Insufficient documentation

## 2012-03-29 DIAGNOSIS — Z8739 Personal history of other diseases of the musculoskeletal system and connective tissue: Secondary | ICD-10-CM | POA: Insufficient documentation

## 2012-03-29 LAB — BASIC METABOLIC PANEL
BUN: 5 mg/dL — ABNORMAL LOW (ref 6–23)
Calcium: 9.3 mg/dL (ref 8.4–10.5)
GFR calc Af Amer: >90 mL/min (ref >90)
GFR calc non Af Amer: >90 mL/min (ref >90)
Glucose, Bld: 384 mg/dL — ABNORMAL HIGH (ref 70–99)
Potassium: 3.8 mEq/L (ref 3.5–5.1)
Sodium: 134 mEq/L — ABNORMAL LOW (ref 135–145)

## 2012-03-29 LAB — GLUCOSE, CAPILLARY: Glucose-Capillary: 184 mg/dL — ABNORMAL HIGH (ref 70–99)

## 2012-03-29 LAB — CBC
MCH: 27.5 pg (ref 26.0–34.0)
MCHC: 33.1 g/dL (ref 30.0–36.0)
RDW: 14.3 % (ref 11.5–15.5)

## 2012-03-29 LAB — POCT I-STAT TROPONIN I

## 2012-03-29 MED ORDER — METOCLOPRAMIDE HCL 10 MG PO TABS: 10.0000 mg | ORAL_TABLET | Freq: Four times a day (QID) | ORAL | Status: DC | PRN

## 2012-03-29 MED ORDER — INSULIN ASPART 100 UNIT/ML ~~LOC~~ SOLN
SUBCUTANEOUS | Status: AC
  Filled 2012-03-29: qty 3

## 2012-03-29 MED ORDER — METOCLOPRAMIDE HCL 5 MG/ML IJ SOLN
10.0000 mg | Freq: Once | INTRAMUSCULAR | Status: AC
  Administered 2012-03-29: 10 mg via INTRAVENOUS
  Filled 2012-03-29: qty 2

## 2012-03-29 MED ORDER — PANTOPRAZOLE SODIUM 40 MG IV SOLR
40.0000 mg | Freq: Once | INTRAVENOUS | Status: AC
  Administered 2012-03-29: 40 mg via INTRAVENOUS
  Filled 2012-03-29: qty 40

## 2012-03-29 MED ORDER — HYDROMORPHONE HCL PF 1 MG/ML IJ SOLN
0.5000 mg | Freq: Once | INTRAMUSCULAR | Status: AC
  Administered 2012-03-29: 0.5 mg via INTRAVENOUS
  Filled 2012-03-29: qty 1

## 2012-03-29 MED ORDER — LORAZEPAM 2 MG/ML IJ SOLN
0.5000 mg | Freq: Once | INTRAMUSCULAR | Status: AC
  Administered 2012-03-29: 0.5 mg via INTRAVENOUS
  Filled 2012-03-29: qty 1

## 2012-03-29 MED ORDER — SODIUM CHLORIDE 0.9 % IV BOLUS (SEPSIS)
1000.0000 mL | Freq: Once | INTRAVENOUS | Status: AC
  Administered 2012-03-29: 1000 mL via INTRAVENOUS

## 2012-03-29 MED ORDER — SODIUM CHLORIDE 0.9 % IV BOLUS (SEPSIS)
500.0000 mL | Freq: Once | INTRAVENOUS | Status: AC
  Administered 2012-03-29: 500 mL via INTRAVENOUS

## 2012-03-29 MED ORDER — ONDANSETRON HCL 4 MG/2ML IJ SOLN
4.0000 mg | Freq: Once | INTRAMUSCULAR | Status: AC
  Administered 2012-03-29: 4 mg via INTRAVENOUS
  Filled 2012-03-29: qty 2

## 2012-03-29 MED ORDER — INSULIN ASPART 100 UNIT/ML ~~LOC~~ SOLN
8.0000 [IU] | Freq: Once | SUBCUTANEOUS | Status: AC
  Administered 2012-03-29: 8 [IU] via SUBCUTANEOUS

## 2012-03-29 NOTE — ED Notes (Signed)
Pt given ginger ale.

## 2012-03-29 NOTE — ED Notes (Signed)
Pt c/o midsternal CP starting last night with SOB and diaphoresis; pt sts took 325mg  ASA and 3 SL nitro without change; pt sts pain worse with inspiration and palpation

## 2012-03-29 NOTE — ED Provider Notes (Signed)
History     CSN: [id number]  Arrival date & time 03/29/12  1449   First MD Initiated Contact with Patient 03/29/12 1814      Chief Complaint  Patient presents with  . Chest Pain    (Consider location/radiation/quality/duration/timing/severity/associated sxs/prior treatment) Patient is a 61 y.o. female presenting with chest pain. The history is provided by the patient.  Chest Pain Primary symptoms include nausea and vomiting. Pertinent negatives for primary symptoms include no fever, no shortness of breath, no cough and no palpitations.   pt c/o nv and epigastric, lower sternal/xiphoid area pain onset this last pm - constant. Several episodes emesis. Constant, burning pain all day. No specific exacerbating or alleviating factors. Does not radiate. No neck or jaw pain. No back pain. No associated sob, or diaphoresis. No other recent exertional cp or discomfort. No pleuritic pain. No leg pain or swelling. No cough or uri c/o. No fever or chills. Denies pud or pancreatitis. Hx cholecystectomy.  Pt notes recent cardiac cath for cp, states was told no signif cad. No abd distension, having normal bms incl today.    Past Medical History  Diagnosis Date  . Hypertension   . High cholesterol   . Pacemaker   . Diabetes mellitus   . Arthritis   . Headache   . PONV (postoperative nausea and vomiting)   . Coronary artery disease   . Anxiety   . Fibromyalgia   . Dysrhythmia   . 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 Abdominal hysterectomy   . Joint replacement     right knee  . Cholecystectomy     2005-2006  . Insert / replace / remove pacemaker     2008, [employer], Pacemaker  . Fracture surgery     left ankle    Family History  Problem Relation Age of Onset  . Coronary artery disease Mother     s/p stenting, currently 39  . Cancer Father     heavy smoker, died of lung cancer in 26  . Brain cancer Brother     ? CAD    History  Substance  Use Topics  . Smoking status: Never Smoker   . Smokeless tobacco: Not on file  . Alcohol Use: No    OB History    Grav Para Term Preterm Abortions TAB SAB Ect Mult Living                  Review of Systems  Constitutional: Negative for fever.  HENT: Negative for neck pain.   Eyes: Negative for redness.  Respiratory: Negative for cough and shortness of breath.   Cardiovascular: Positive for chest pain. Negative for palpitations and leg swelling.  Gastrointestinal: Positive for nausea and vomiting. Negative for diarrhea and constipation.  Genitourinary: Negative for dysuria and flank pain.  Musculoskeletal: Negative for back pain.  Skin: Negative for rash.  Neurological: Negative for headaches.  Hematological: Does not bruise/bleed easily.  Psychiatric/Behavioral: Negative for confusion.    Allergies  Shellfish allergy; Carafate; Cephalexin; Decadron; Erythromycin; Heparin; Iohexol; Ketorolac tromethamine; Methocarbamol; Penicillins; Prednisone; and Questran  Home Medications   Current Outpatient Rx  Name Route Sig Dispense Refill  . ASPIRIN 81 MG PO TABS Oral Take 81 mg by mouth daily.      . ATENOLOL 25 MG PO TABS Oral Take 25 mg by mouth daily.      Marland Kitchen DIAZEPAM 10 MG PO TABS Oral Take  10 mg by mouth 2 (two) times daily as needed. For anxiety    . INSULIN ASPART 100 UNIT/ML [place] SOLN Subcutaneous Inject 0-10 Units into the skin 3 (three) times daily before meals. Based on Sliding scale    . INSULIN GLARGINE 100 UNIT/ML [place] SOLN Subcutaneous Inject 30 Units into the skin at bedtime. 10 mL   . LISINOPRIL-HYDROCHLOROTHIAZIDE 10-12.5 MG PO TABS Oral Take 1 tablet by mouth daily.    Marland Kitchen METFORMIN HCL 850 MG PO TABS Oral Take 850 mg by mouth 2 (two) times daily with a meal.     . NITROGLYCERIN 0.4 MG SL SUBL Sublingual Place 0.4 mg under the tongue every 5 (five) minutes as needed. For chest pain.     Marland Kitchen PANTOPRAZOLE SODIUM 40 MG PO TBEC Oral Take 40 mg by mouth 2 (two) times daily  before a meal.      . SIMVASTATIN 40 MG PO TABS Oral Take 40 mg by mouth at bedtime.       BP 152/91  Pulse 88  Temp 98.6 F (37 C) (Oral)  Resp 19  SpO2 99%  LMP 09/26/2011  Physical Exam  Nursing note and vitals reviewed. Constitutional: She is oriented to person, place, and time. She appears well-developed and well-nourished. No distress.  HENT:  Mouth/Throat: Oropharynx is clear and moist.  Eyes: Conjunctivae are normal. No scleral icterus.  Neck: Neck supple. No tracheal deviation present.  Cardiovascular: Normal rate, regular rhythm, normal heart sounds and intact distal pulses.  Exam reveals no gallop and no friction rub.   No murmur heard. Pulmonary/Chest: Effort normal and breath sounds normal. No respiratory distress.  Abdominal: Soft. Normal appearance and bowel sounds are normal. She exhibits no distension and no mass. There is no tenderness. There is no rebound and no guarding.  Genitourinary:       No cva tenderness  Musculoskeletal: She exhibits no edema and no tenderness.  Neurological: She is alert and oriented to person, place, and time.  Skin: Skin is warm and dry. No rash noted.  Psychiatric: She has a normal mood and affect.    ED Course  Procedures (including critical care time)  Labs Reviewed  CBC - Abnormal; Notable for the following:    WBC 10.9 (*)     All other components within normal limits  BASIC METABOLIC PANEL - Abnormal; Notable for the following:    Sodium 134 (*)     Glucose, Bld 384 (*)     BUN 5 (*)     All other components within normal limits  POCT I-STAT TROPONIN I   Dg Chest 2 View  03/29/2012  *RADIOLOGY REPORT*  Clinical Data: Chest pain, short ofbreath  CHEST - 2 VIEW  Comparison: Chest radiograph 03/17/2012  Findings: Left-sided pacemaker with two obtuse lead overlies stable enlarged heart silhouette and low lung volumes.  No effusion, infiltrate, or pneumothorax.  IMPRESSION: Cardiomegaly without acute cardiopulmonary  process.  Original Report Authenticated By: Genevive Bi, M.D.    Results for orders placed during the hospital encounter of 03/29/12  CBC      Component Value Range   WBC 10.9 (*) 4.0 - 10.5 K/uL   RBC 4.91  3.87 - 5.11 MIL/uL   Hemoglobin 13.5  12.0 - 15.0 g/dL   HCT 16.1  09.6 - 04.5 %   MCV 83.1  78.0 - 100.0 fL   MCH 27.5  26.0 - 34.0 pg   MCHC 33.1  30.0 - 36.0 g/dL  RDW 14.3  11.5 - 15.5 %   Platelets 304  150 - 400 K/uL  BASIC METABOLIC PANEL      Component Value Range   Sodium 134 (*) 135 - 145 mEq/L   Potassium 3.8  3.5 - 5.1 mEq/L   Chloride 96  96 - 112 mEq/L   CO2 21  19 - 32 mEq/L   Glucose, Bld 384 (*) 70 - 99 mg/dL   BUN 5 (*) 6 - 23 mg/dL   Creatinine, Ser 2.95  0.50 - 1.10 mg/dL   Calcium 9.3  8.4 - 62.1 mg/dL   GFR calc non Af Amer >90  >90 mL/min   GFR calc Af Amer >90  >90 mL/min  POCT I-STAT TROPONIN I      Component Value Range   Troponin i, poc 0.00  0.00 - 0.08 ng/mL   Comment 3            Dg Chest 2 View  03/29/2012  *RADIOLOGY REPORT*  Clinical Data: Chest pain, short ofbreath  CHEST - 2 VIEW  Comparison: Chest radiograph 03/17/2012  Findings: Left-sided pacemaker with two obtuse lead overlies stable enlarged heart silhouette and low lung volumes.  No effusion, infiltrate, or pneumothorax.  IMPRESSION: Cardiomegaly without acute cardiopulmonary process.  Original Report Authenticated By: Genevive Bi, M.D.   Dg Chest 2 View  03/17/2012  *RADIOLOGY REPORT*  Clinical Data: Chest pain  CHEST - 2 VIEW  Comparison: 03/09/2012  Findings: There is a left chest wall pacer device with lead in the right atrial appendage and right ventricle.  Heart size is normal.  No pleural effusion or edema.  Asymmetric elevation of the right hemidiaphragm is noted.  There is plate-like atelectasis noted in the right base.  IMPRESSION:  1.  Right base atelectasis.  Original Report Authenticated By: Rosealee Albee, M.D.       MDM  Iv ns. 500 cc ns bolus.    zofran iv. reglan iv. Dilaudid .5 mg iv.   Reviewed nursing notes and prior charts for additional history.    Date: 03/29/2012  Rate: 90  Rhythm: normal sinus rhythm  QRS Axis: normal  Intervals: normal  ST/T Wave abnormalities: nonspecific ST/T changes  Conduction Disutrbances:none  Narrative Interpretation:   Old EKG Reviewed: unchanged ecg unchanged from prior 03/17/12    Reviewed recent hosp admit - cardiac cath 2 weeks ago for same type of cp, was told non obstructive disease, non cardiac cp, and was felt to have cp related to gastroparesis/reflux.   protonix iv.    After constant symptoms for approx 24 hrs and recent neg cardiac cath - trop neg, feel cp non cardiac.   Recheck pt feels improved w meds. No recurrent nv. abd soft nt.    bs elevated, additional ns iv. Insulin sq. Recheck bs.   Recheck no recurrent nv. Feels improved. No pain. No sob. Chest cta. abd soft nt.     Suzi Roots, MD 03/29/12 2312

## 2012-03-29 NOTE — ED Notes (Signed)
Pt d/c home in NAD. Pt voiced understanding of d/c instructions and the importance of follow up care. Pt given instructions on gastroparesis as well as healthier eating. Pt instructed she could not drive this evening and pt stated would be getting a ride.

## 2012-03-29 NOTE — ED Notes (Signed)
2 RNs looked for IV access, nothing found. Will page IV team.

## 2012-03-29 NOTE — ED Notes (Signed)
Pt c/o chest pain, diaphoresis, n/v x6 times, sob. Pt does appear diaphoretic and shallow breathing. Pt states pain is 8/10. Pt states nitro gave her no relief.

## 2012-04-01 MED FILL — Insulin Aspart Inj 100 Unit/ML: SUBCUTANEOUS | Qty: 0.08 | Status: AC

## 2012-06-16 ENCOUNTER — Emergency Department (HOSPITAL_COMMUNITY): Payer: Medicare Other

## 2012-06-16 ENCOUNTER — Encounter (HOSPITAL_COMMUNITY): Payer: Self-pay | Admitting: Physical Medicine and Rehabilitation

## 2012-06-16 ENCOUNTER — Emergency Department (HOSPITAL_COMMUNITY)
Admission: EM | Admit: 2012-06-16 | Discharge: 2012-06-16 | Disposition: A | Discharge status: Home / Self Care | Payer: Medicare Other | Attending: Emergency Medicine | Admitting: Emergency Medicine

## 2012-06-16 DIAGNOSIS — R6884 Jaw pain: Secondary | ICD-10-CM | POA: Insufficient documentation

## 2012-06-16 DIAGNOSIS — I2510 Atherosclerotic heart disease of native coronary artery without angina pectoris: Secondary | ICD-10-CM | POA: Insufficient documentation

## 2012-06-16 DIAGNOSIS — M129 Arthropathy, unspecified: Secondary | ICD-10-CM | POA: Insufficient documentation

## 2012-06-16 DIAGNOSIS — R61 Generalized hyperhidrosis: Secondary | ICD-10-CM | POA: Insufficient documentation

## 2012-06-16 DIAGNOSIS — Z8542 Personal history of malignant neoplasm of other parts of uterus: Secondary | ICD-10-CM | POA: Insufficient documentation

## 2012-06-16 DIAGNOSIS — E785 Hyperlipidemia, unspecified: Secondary | ICD-10-CM | POA: Insufficient documentation

## 2012-06-16 DIAGNOSIS — M79609 Pain in unspecified limb: Secondary | ICD-10-CM | POA: Insufficient documentation

## 2012-06-16 DIAGNOSIS — I10 Essential (primary) hypertension: Secondary | ICD-10-CM | POA: Insufficient documentation

## 2012-06-16 DIAGNOSIS — R112 Nausea with vomiting, unspecified: Secondary | ICD-10-CM | POA: Insufficient documentation

## 2012-06-16 DIAGNOSIS — F411 Generalized anxiety disorder: Secondary | ICD-10-CM | POA: Insufficient documentation

## 2012-06-16 DIAGNOSIS — Z7982 Long term (current) use of aspirin: Secondary | ICD-10-CM | POA: Insufficient documentation

## 2012-06-16 DIAGNOSIS — Z79899 Other long term (current) drug therapy: Secondary | ICD-10-CM | POA: Insufficient documentation

## 2012-06-16 DIAGNOSIS — Z950 Presence of cardiac pacemaker: Secondary | ICD-10-CM | POA: Insufficient documentation

## 2012-06-16 DIAGNOSIS — Z794 Long term (current) use of insulin: Secondary | ICD-10-CM | POA: Insufficient documentation

## 2012-06-16 DIAGNOSIS — E7800 Pure hypercholesterolemia, unspecified: Secondary | ICD-10-CM | POA: Insufficient documentation

## 2012-06-16 DIAGNOSIS — R110 Nausea: Secondary | ICD-10-CM

## 2012-06-16 DIAGNOSIS — R079 Chest pain, unspecified: Secondary | ICD-10-CM

## 2012-06-16 DIAGNOSIS — E119 Type 2 diabetes mellitus without complications: Secondary | ICD-10-CM | POA: Insufficient documentation

## 2012-06-16 LAB — CBC WITH DIFFERENTIAL/PLATELET
Basophils Absolute: 0 10*3/uL (ref 0.0–0.1)
Basophils Relative: 0 % (ref 0–1)
Eosinophils Absolute: 0.1 10*3/uL (ref 0.0–0.7)
HCT: 36.3 % (ref 36.0–46.0)
Hemoglobin: 12.1 g/dL (ref 12.0–15.0)
MCH: 27.4 pg (ref 26.0–34.0)
MCHC: 33.3 g/dL (ref 30.0–36.0)
Monocytes Absolute: 0.4 10*3/uL (ref 0.1–1.0)
Monocytes Relative: 5 % (ref 3–12)
Neutro Abs: 4.5 10*3/uL (ref 1.7–7.7)
RDW: 15 % (ref 11.5–15.5)

## 2012-06-16 LAB — COMPREHENSIVE METABOLIC PANEL
AST: 31 U/L (ref 0–37)
Albumin: 3.4 g/dL — ABNORMAL LOW (ref 3.5–5.2)
BUN: 9 mg/dL (ref 6–23)
Calcium: 9 mg/dL (ref 8.4–10.5)
Creatinine, Ser: 0.65 mg/dL (ref 0.50–1.10)
Total Bilirubin: 0.2 mg/dL — ABNORMAL LOW (ref 0.3–1.2)
Total Protein: 7.4 g/dL (ref 6.0–8.3)

## 2012-06-16 LAB — POCT I-STAT TROPONIN I
Troponin i, poc: 0 ng/mL (ref 0.00–0.08)
Troponin i, poc: 0 ng/mL (ref 0.00–0.08)

## 2012-06-16 LAB — GLUCOSE, CAPILLARY
Glucose-Capillary: 410 mg/dL — ABNORMAL HIGH (ref 70–99)
Glucose-Capillary: 146 mg/dL — ABNORMAL HIGH (ref 70–99)

## 2012-06-16 MED ORDER — ONDANSETRON HCL 4 MG/2ML IJ SOLN
4.0000 mg | Freq: Once | INTRAMUSCULAR | Status: AC
  Administered 2012-06-16: 4 mg via INTRAVENOUS
  Filled 2012-06-16: qty 2

## 2012-06-16 MED ORDER — SODIUM CHLORIDE 0.9 % IV BOLUS (SEPSIS)
1000.0000 mL | Freq: Once | INTRAVENOUS | Status: AC
  Administered 2012-06-16: 1000 mL via INTRAVENOUS

## 2012-06-16 MED ORDER — MORPHINE SULFATE 2 MG/ML IJ SOLN
2.0000 mg | Freq: Once | INTRAMUSCULAR | Status: AC
  Administered 2012-06-16: 2 mg via INTRAVENOUS
  Filled 2012-06-16: qty 1

## 2012-06-16 MED ORDER — MORPHINE SULFATE 4 MG/ML IJ SOLN
4.0000 mg | Freq: Once | INTRAMUSCULAR | Status: AC
  Administered 2012-06-16: 4 mg via INTRAVENOUS
  Filled 2012-06-16: qty 1

## 2012-06-16 MED ORDER — SODIUM CHLORIDE 0.9 % IV SOLN
Freq: Once | INTRAVENOUS | Status: AC
  Administered 2012-06-16: 18:00:00 via INTRAVENOUS

## 2012-06-16 MED ORDER — ONDANSETRON 8 MG PO TBDP: 8.0000 mg | ORAL_TABLET | Freq: Three times a day (TID) | ORAL | Status: DC | PRN

## 2012-06-16 MED ORDER — SODIUM CHLORIDE 0.9 % IV SOLN
Freq: Once | INTRAVENOUS | Status: AC
  Administered 2012-06-16: 1000 mL via INTRAVENOUS

## 2012-06-16 MED ORDER — LORAZEPAM 1 MG PO TABS
1.0000 mg | ORAL_TABLET | Freq: Once | ORAL | Status: AC
  Administered 2012-06-16: 1 mg via ORAL
  Filled 2012-06-16: qty 1

## 2012-06-16 NOTE — ED Notes (Signed)
CBG 410 °

## 2012-06-16 NOTE — ED Provider Notes (Signed)
History     CSN: [id number]  Arrival date & time 06/16/12  1150   First MD Initiated Contact with Patient 06/16/12 1230      Chief Complaint  Patient presents with  . Chest Pain  . Nausea    (Consider location/radiation/quality/duration/timing/severity/associated sxs/prior treatment) Patient is a 61 y.o. female presenting with chest pain. The history is provided by the patient.  Chest Pain Episode onset: started around 7:30 this am. Duration of episode(s) is 4 hours. Chest pain occurs constantly. The chest pain is unchanged. Associated with: started when she woke up this am. At its most intense, the pain is at 6/10. The pain is currently at 6/10. The severity of the pain is moderate. The quality of the pain is described as heavy, pressure-like, sharp and squeezing. The pain radiates to the left jaw and left arm. Primary symptoms include nausea and vomiting. Pertinent negatives for primary symptoms include no shortness of breath, no cough, no wheezing, no palpitations and no abdominal pain.  Associated symptoms include diaphoresis. She tried nitroglycerin (no improvement with NTG) for the symptoms.  Her past medical history is significant for diabetes, hyperlipidemia and hypertension.  Pertinent negatives for past medical history include no CAD and no MI.  Procedure history is positive for cardiac catheterization. Procedure history comments: had cath in 7/13 which showed non-obstructive disease.     Past Medical History  Diagnosis Date  . Hypertension   . High cholesterol   . Pacemaker   . Diabetes mellitus   . Arthritis   . Headache   . PONV (postoperative nausea and vomiting)   . Coronary artery disease   . Anxiety   . Fibromyalgia   . Dysrhythmia   . 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 Abdominal hysterectomy   . Joint replacement     right knee  . Cholecystectomy     2005-2006  . Insert / replace / remove pacemaker     2008,  [employer], Pacemaker  . Fracture surgery     left ankle    Family History  Problem Relation Age of Onset  . Coronary artery disease Mother     s/p stenting, currently 72  . Cancer Father     heavy smoker, died of lung cancer in 8  . Brain cancer Brother     ? CAD    History  Substance Use Topics  . Smoking status: Never Smoker   . Smokeless tobacco: Not on file  . Alcohol Use: No    OB History    Grav Para Term Preterm Abortions TAB SAB Ect Mult Living                  Review of Systems  Constitutional: Positive for diaphoresis.  Respiratory: Negative for cough, shortness of breath and wheezing.   Cardiovascular: Positive for chest pain. Negative for palpitations.  Gastrointestinal: Positive for nausea and vomiting. Negative for abdominal pain.  All other systems reviewed and are negative.    Allergies  Shellfish allergy; Carafate; Cephalexin; Decadron; Erythromycin; Heparin; Iohexol; Ketorolac tromethamine; Methocarbamol; Penicillins; Prednisone; and Questran  Home Medications   Current Outpatient Rx  Name Route Sig Dispense Refill  . ASPIRIN 325 MG PO TABS Oral Take 325 mg by mouth once. For chest pain    . ASPIRIN 81 MG PO TABS Oral Take 81 mg by mouth daily.      . ATENOLOL 25 MG PO TABS  Oral Take 25 mg by mouth at bedtime.     Marland Kitchen DIAZEPAM 10 MG PO TABS Oral Take 10 mg by mouth 2 (two) times daily as needed. For anxiety    . INSULIN ASPART 100 UNIT/ML [place] SOLN Subcutaneous Inject 1-10 Units into the skin 3 (three) times daily before meals. Pt takes anywhere from 1-10 units based on sliding scale.    . INSULIN GLARGINE 100 UNIT/ML [place] SOLN Subcutaneous Inject 38 Units into the skin at bedtime.     Marland Kitchen LISINOPRIL-HYDROCHLOROTHIAZIDE 10-12.5 MG PO TABS Oral Take 1 tablet by mouth every morning.     Marland Kitchen METFORMIN HCL 850 MG PO TABS Oral Take 850 mg by mouth 2 (two) times daily with a meal.     . NITROGLYCERIN 0.4 MG SL SUBL Sublingual Place 0.4 mg under the tongue  every 5 (five) minutes as needed. For chest pain.     Marland Kitchen PANTOPRAZOLE SODIUM 40 MG PO TBEC Oral Take 40 mg by mouth 2 (two) times daily before a meal.      . SIMVASTATIN 40 MG PO TABS Oral Take 40 mg by mouth at bedtime.     Marland Kitchen METOCLOPRAMIDE HCL 10 MG PO TABS Oral Take 1 tablet (10 mg total) by mouth 4 (four) times daily as needed. 30 tablet 0    BP 150/83  Pulse 81  Temp 98.1 F (36.7 C) (Oral)  Resp 18  SpO2 95%  LMP 09/26/2011  Physical Exam  Nursing note and vitals reviewed. Constitutional: She is oriented to person, place, and time. She appears well-developed and well-nourished. No distress.  HENT:  Head: Normocephalic and atraumatic.  Mouth/Throat: Oropharynx is clear and moist.  Eyes: Conjunctivae normal and EOM are normal. Pupils are equal, round, and reactive to light.  Neck: Normal range of motion. Neck supple.  Cardiovascular: Normal rate, regular rhythm and intact distal pulses.   No murmur heard. Pulmonary/Chest: Effort normal and breath sounds normal. No respiratory distress. She has no wheezes. She has no rales. She exhibits tenderness.    Abdominal: Soft. She exhibits no distension. There is no tenderness. There is no rebound and no guarding.  Musculoskeletal: Normal range of motion. She exhibits no edema and no tenderness.  Neurological: She is alert and oriented to person, place, and time.  Skin: Skin is warm. No rash noted. She is diaphoretic. No erythema.  Psychiatric: She has a normal mood and affect. Her behavior is normal.    ED Course  Procedures (including critical care time)  Labs Reviewed  COMPREHENSIVE METABOLIC PANEL - Abnormal; Notable for the following:    Sodium 132 (*)     Glucose, Bld 445 (*)     Albumin 3.4 (*)     Alkaline Phosphatase 246 (*)     Total Bilirubin 0.2 (*)     All other components within normal limits  GLUCOSE, CAPILLARY - Abnormal; Notable for the following:    Glucose-Capillary 410 (*)     All other components within  normal limits  CBC WITH DIFFERENTIAL  POCT I-STAT TROPONIN I   Dg Chest 2 View  06/16/2012  *RADIOLOGY REPORT*  Clinical Data: Chest pain  CHEST - 2 VIEW  Comparison: 05/18/2012  Findings: Lungs are essentially clear.  No pleural effusion or pneumothorax.  The heart is top normal in size.  Left subclavian pacemaker.  Mild degenerative changes of the visualized thoracolumbar spine. Exaggerated thoracic kyphosis.  Cholecystectomy clips.  IMPRESSION: No evidence of acute cardiopulmonary disease.   Original Report  Authenticated By: Charline Bills, M.D.      Date: 06/16/2012  Rate: 79  Rhythm: normal sinus rhythm  QRS Axis: normal  Intervals: normal  ST/T Wave abnormalities: nonspecific ST/T changes  Conduction Disutrbances:none  Narrative Interpretation:   Old EKG Reviewed: unchanged   No diagnosis found.    MDM   Patient with multiple past medical admissions for chest pain radiating up into her jaw and down her arm causing vomiting. Patient recently had a cardiac catheterization in July which showed minimal coronary disease without any signs of obstructing lesions 30% or less. Patient states today she attempted to take nitroglycerin without improvement in her symptoms. She also admits today that her sugar was 4-500 and states that often when she has these symptoms her sugar is high. She has no abdominal tenderness and has normal vital signs on exam. EKG is unrevealing. Due to the all the recent cardiac workups have a lower suspicion of cardiac etiology however will send a troponin and get a chest x-ray. Also CBC and CMP were sent. Patient was given morphine and Zofran for nausea and pain. She has no symptoms suggestive of PE at this time. Will also treat hyperglycemia of 4/10 today.  2:08 PM Initial troponin was negative which is now 4 hours out from when her symptoms started. Persistent hyperglycemia we'll continue to treat. We'll place patient in the CDU for treatment of her  hyperglycemia and a repeat enzymes in 3 hours. After morphine and Zofran patient is feeling better.      Gwyneth Sprout, MD 06/16/12 2035

## 2012-06-16 NOTE — ED Notes (Signed)
IV team at bedside to attempt to establish better IV access.

## 2012-06-16 NOTE — ED Notes (Signed)
Pt to radiology.

## 2012-06-16 NOTE — ED Provider Notes (Signed)
3:15 PM Assumed care of patient in the CDU.  Patient comes in today with a chief complaint of chest pain with onset this morning.  Patient had a cardiac cath done approximately 2 months ago that showed less than 30% blockage.  Her initial troponin was negative and EKG with no acute abnormalities.  Patient has an elevated CBG.  Plan is for the patient to receive IVF to bring down the blood sugar.  Patient is also to get a three hour troponin.  Plan is for the patient to be discharged home if her symptoms improve and if the 3 hour troponin is negative.  Reassessed patient.  Patient reports that she continues to feel nauseous.  Pain has improved somewhat.  Patient alert and orientated x 3, Heart RRR, Lungs CTAB, chest tender to palpation,  Abdomen soft and nontender,   7:00 PM Reassessed patient.  Patient is very anxious.  She reports that she continues to have some pain and nausea, but that her pain and nausea have improved.  Will order a dose of Ativan and reassess.  8:22 PM Reassessed patient.  She reports that her pain and her nausea have improved.  Three hour troponin negative.  Feel that patient can be discharged home.  Pascal Lux [place], PA-C 06/17/12 1301

## 2012-06-16 NOTE — ED Notes (Signed)
Pt presents to department for evaluation of midsternal chest pain radiating to L jaw and shoulder, nausea and diaphoresis. Onset this morning. Respirations unlabored. She is conscious alert and oriented x4.

## 2012-06-16 NOTE — ED Notes (Signed)
Patient was encourage to taken in a meal prior to being released. Patient stated that she rather wait until she goes home.

## 2012-06-16 NOTE — ED Notes (Signed)
Patient was given discharge instructions and released from the ED in stable condition.  

## 2012-06-16 NOTE — ED Notes (Signed)
Vital signs stable. 

## 2012-06-18 NOTE — ED Provider Notes (Signed)
Medical screening examination/treatment/procedure(s) were conducted as a shared visit with non-physician practitioner(s) and myself.  I personally evaluated the patient during the encounter   Michaela Sprout, MD 06/18/12 1558

## 2012-10-20 IMAGING — NM NM PULMONARY VENT & PERF
2 series · 12 of 12 positions shown · non-contrast
Comparison: Chest radiographic examination 07/30/2011.

CLINICAL DATA: Chest pain.  Shortness of breath.  Weakness.
Pacemaker in place.

NUCLEAR MEDICINE VENTILATION - PERFUSION LUNG SCAN
TECHNIQUE: Wash-in, equilibrium, and wash-out phase ventilation
images were obtained using Ve-0MM gas.  Perfusion images were
obtained in multiple projections after intravenous injection of Tc-
99m MAA.
Radiopharmaceuticals:  10 mCi Ve-0MM gas and 6 mCi 8c-PPm MAA.

[vq scan · 2.52mm/px · 6 of 19 frames shown (1 of 2)]
[frame 2/19  full-range]
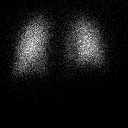
[frame 5/19  full-range]
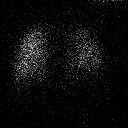
[frame 8/19]
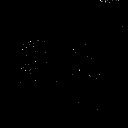
[frame 11/19]
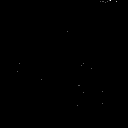
[frame 14/19]
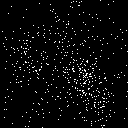
[frame 18/19]
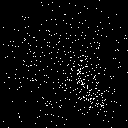

[vq scan · 2.52mm/px · 6 of 19 frames shown (2 of 2)]
[frame 2/19  full-range]
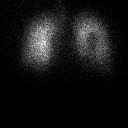
[frame 5/19  full-range]
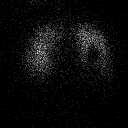
[frame 8/19]
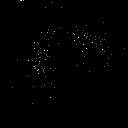
[frame 11/19]
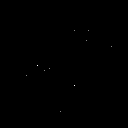
[frame 14/19]
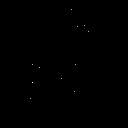
[frame 18/19]
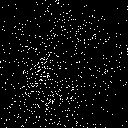

[12 of 12 positions shown; findings below may reference images not displayed]

FINDINGS: Ventilation study:  A focal defect in the anterior aspect of the
left hemithorax midportion corresponds to a transvenous pacemaker
controller device.  No segmental or lobar defects are evident.

Perfusion study: A focal defect in the anterior aspect of the left
hemithorax midportion corresponds to a transvenous pacemaker
controller device.  No segmental or lobar defects are evident. A
linear area of subsegmental decreased perfusion in the lingula
corresponds to an area of atelectasis or fibrosis on the chest
radiographic examination.  No ventilation perfusion mismatch is
evident comparing anterior and posterior images.
IMPRESSION: No segmental or lobar ventilation or perfusion defects were
evident.  No ventilation perfusion mismatch is evident comparing
anterior and posterior images of perfusion and ventilation studies.
Subsegmental area of decreased perfusion in the lingula corresponds
to the area of atelectasis or fibrosis within the lingula.

Examination is low probability for pulmonary embolism.

## 2013-10-26 ENCOUNTER — Emergency Department (HOSPITAL_COMMUNITY): Payer: Medicare Other

## 2013-10-26 ENCOUNTER — Observation Stay (HOSPITAL_COMMUNITY)
Admission: EM | Admit: 2013-10-26 | Discharge: 2013-10-27 | Disposition: A | Discharge status: Home / Self Care | Payer: Medicare Other | Attending: Internal Medicine | Admitting: Internal Medicine

## 2013-10-26 ENCOUNTER — Encounter (HOSPITAL_COMMUNITY): Payer: Self-pay | Admitting: Emergency Medicine

## 2013-10-26 DIAGNOSIS — R0602 Shortness of breath: Secondary | ICD-10-CM | POA: Insufficient documentation

## 2013-10-26 DIAGNOSIS — Z7982 Long term (current) use of aspirin: Secondary | ICD-10-CM | POA: Insufficient documentation

## 2013-10-26 DIAGNOSIS — E782 Mixed hyperlipidemia: Secondary | ICD-10-CM

## 2013-10-26 DIAGNOSIS — Z794 Long term (current) use of insulin: Secondary | ICD-10-CM | POA: Insufficient documentation

## 2013-10-26 DIAGNOSIS — F329 Major depressive disorder, single episode, unspecified: Secondary | ICD-10-CM

## 2013-10-26 DIAGNOSIS — Z79899 Other long term (current) drug therapy: Secondary | ICD-10-CM | POA: Insufficient documentation

## 2013-10-26 DIAGNOSIS — K219 Gastro-esophageal reflux disease without esophagitis: Secondary | ICD-10-CM

## 2013-10-26 DIAGNOSIS — Z8542 Personal history of malignant neoplasm of other parts of uterus: Secondary | ICD-10-CM | POA: Insufficient documentation

## 2013-10-26 DIAGNOSIS — M797 Fibromyalgia: Secondary | ICD-10-CM

## 2013-10-26 DIAGNOSIS — Z765 Malingerer [conscious simulation]: Secondary | ICD-10-CM | POA: Insufficient documentation

## 2013-10-26 DIAGNOSIS — M129 Arthropathy, unspecified: Secondary | ICD-10-CM | POA: Insufficient documentation

## 2013-10-26 DIAGNOSIS — R110 Nausea: Secondary | ICD-10-CM | POA: Insufficient documentation

## 2013-10-26 DIAGNOSIS — IMO0001 Reserved for inherently not codable concepts without codable children: Secondary | ICD-10-CM

## 2013-10-26 DIAGNOSIS — F411 Generalized anxiety disorder: Secondary | ICD-10-CM | POA: Insufficient documentation

## 2013-10-26 DIAGNOSIS — Z91013 Allergy to seafood: Secondary | ICD-10-CM | POA: Insufficient documentation

## 2013-10-26 DIAGNOSIS — Z950 Presence of cardiac pacemaker: Secondary | ICD-10-CM | POA: Insufficient documentation

## 2013-10-26 DIAGNOSIS — Z888 Allergy status to other drugs, medicaments and biological substances status: Secondary | ICD-10-CM | POA: Insufficient documentation

## 2013-10-26 DIAGNOSIS — E1165 Type 2 diabetes mellitus with hyperglycemia: Secondary | ICD-10-CM

## 2013-10-26 DIAGNOSIS — E783 Hyperchylomicronemia: Secondary | ICD-10-CM | POA: Insufficient documentation

## 2013-10-26 DIAGNOSIS — IMO0002 Reserved for concepts with insufficient information to code with codable children: Secondary | ICD-10-CM | POA: Diagnosis present

## 2013-10-26 DIAGNOSIS — F419 Anxiety disorder, unspecified: Secondary | ICD-10-CM

## 2013-10-26 DIAGNOSIS — I10 Essential (primary) hypertension: Secondary | ICD-10-CM | POA: Insufficient documentation

## 2013-10-26 DIAGNOSIS — R079 Chest pain, unspecified: Secondary | ICD-10-CM | POA: Diagnosis present

## 2013-10-26 DIAGNOSIS — R0789 Other chest pain: Principal | ICD-10-CM | POA: Insufficient documentation

## 2013-10-26 DIAGNOSIS — Z880 Allergy status to penicillin: Secondary | ICD-10-CM | POA: Insufficient documentation

## 2013-10-26 DIAGNOSIS — E119 Type 2 diabetes mellitus without complications: Secondary | ICD-10-CM

## 2013-10-26 DIAGNOSIS — Z886 Allergy status to analgesic agent status: Secondary | ICD-10-CM | POA: Insufficient documentation

## 2013-10-26 DIAGNOSIS — E7800 Pure hypercholesterolemia, unspecified: Secondary | ICD-10-CM | POA: Insufficient documentation

## 2013-10-26 DIAGNOSIS — F3289 Other specified depressive episodes: Secondary | ICD-10-CM | POA: Insufficient documentation

## 2013-10-26 DIAGNOSIS — I2510 Atherosclerotic heart disease of native coronary artery without angina pectoris: Secondary | ICD-10-CM | POA: Insufficient documentation

## 2013-10-26 LAB — POCT I-STAT TROPONIN I: Troponin i, poc: 0 ng/mL (ref 0.00–0.08)

## 2013-10-26 LAB — BASIC METABOLIC PANEL
BUN: 9 mg/dL (ref 6–23)
CHLORIDE: 101 meq/L (ref 96–112)
CO2: 24 mEq/L (ref 19–32)
CREATININE: 0.62 mg/dL (ref 0.50–1.10)
Calcium: 9.4 mg/dL (ref 8.4–10.5)
Glucose, Bld: 280 mg/dL — ABNORMAL HIGH (ref 70–99)
POTASSIUM: 4 meq/L (ref 3.7–5.3)
Sodium: 141 mEq/L (ref 137–147)

## 2013-10-26 LAB — GLUCOSE, CAPILLARY
GLUCOSE-CAPILLARY: 181 mg/dL — AB (ref 70–99)
Glucose-Capillary: 173 mg/dL — ABNORMAL HIGH (ref 70–99)

## 2013-10-26 LAB — PRO B NATRIURETIC PEPTIDE: PRO B NATRI PEPTIDE: 35.5 pg/mL (ref 0–125)

## 2013-10-26 LAB — CBC
HEMATOCRIT: 42.5 % (ref 36.0–46.0)
Hemoglobin: 15 g/dL (ref 12.0–15.0)
MCH: 30.2 pg (ref 26.0–34.0)
MCHC: 35.3 g/dL (ref 30.0–36.0)
MCV: 85.7 fL (ref 78.0–100.0)
Platelets: 224 10*3/uL (ref 150–400)
RBC: 4.96 MIL/uL (ref 3.87–5.11)
RDW: 13.3 % (ref 11.5–15.5)
WBC: 10.4 10*3/uL (ref 4.0–10.5)

## 2013-10-26 LAB — TROPONIN I: Troponin I: <0.3 ng/mL (ref <0.30)

## 2013-10-26 MED ORDER — INSULIN ASPART 100 UNIT/ML ~~LOC~~ SOLN: 0.0000 [IU] | Freq: Every day | SUBCUTANEOUS | Status: DC

## 2013-10-26 MED ORDER — ASPIRIN 325 MG PO TABS
325.0000 mg | ORAL_TABLET | Freq: Once | ORAL | Status: AC
  Administered 2013-10-26: 325 mg via ORAL
  Filled 2013-10-26: qty 1

## 2013-10-26 MED ORDER — ATORVASTATIN CALCIUM 40 MG PO TABS
40.0000 mg | ORAL_TABLET | Freq: Every day | ORAL | Status: DC
  Administered 2013-10-26: 40 mg via ORAL
  Filled 2013-10-26 (×2): qty 1

## 2013-10-26 MED ORDER — MORPHINE SULFATE 4 MG/ML IJ SOLN
4.0000 mg | Freq: Once | INTRAMUSCULAR | Status: AC
  Administered 2013-10-26: 4 mg via INTRAVENOUS
  Filled 2013-10-26: qty 1

## 2013-10-26 MED ORDER — GI COCKTAIL ~~LOC~~: 30.0000 mL | Freq: Three times a day (TID) | ORAL | Status: DC | PRN

## 2013-10-26 MED ORDER — ACETAMINOPHEN 325 MG PO TABS: 650.0000 mg | ORAL_TABLET | ORAL | Status: DC | PRN

## 2013-10-26 MED ORDER — METOPROLOL TARTRATE 25 MG PO TABS
25.0000 mg | ORAL_TABLET | Freq: Every day | ORAL | Status: DC
  Administered 2013-10-26: 25 mg via ORAL
  Filled 2013-10-26 (×2): qty 1

## 2013-10-26 MED ORDER — OXYCODONE-ACETAMINOPHEN 5-325 MG PO TABS
1.0000 | ORAL_TABLET | Freq: Three times a day (TID) | ORAL | Status: DC | PRN
  Administered 2013-10-26 – 2013-10-27 (×2): 1 via ORAL
  Filled 2013-10-26 (×2): qty 1

## 2013-10-26 MED ORDER — ALPRAZOLAM 0.5 MG PO TABS
1.0000 mg | ORAL_TABLET | Freq: Two times a day (BID) | ORAL | Status: DC
  Administered 2013-10-26: 1 mg via ORAL
  Filled 2013-10-26 (×2): qty 2

## 2013-10-26 MED ORDER — TIZANIDINE HCL 4 MG PO TABS
4.0000 mg | ORAL_TABLET | Freq: Four times a day (QID) | ORAL | Status: DC | PRN
  Filled 2013-10-26: qty 1

## 2013-10-26 MED ORDER — OXYCODONE HCL 5 MG PO TABS
5.0000 mg | ORAL_TABLET | Freq: Three times a day (TID) | ORAL | Status: DC | PRN
  Administered 2013-10-26 – 2013-10-27 (×2): 5 mg via ORAL
  Filled 2013-10-26 (×2): qty 1

## 2013-10-26 MED ORDER — ONDANSETRON HCL 4 MG/2ML IJ SOLN
4.0000 mg | Freq: Once | INTRAMUSCULAR | Status: AC
  Administered 2013-10-26: 4 mg via INTRAVENOUS
  Filled 2013-10-26: qty 2

## 2013-10-26 MED ORDER — MORPHINE SULFATE 2 MG/ML IJ SOLN: 2.0000 mg | INTRAMUSCULAR | Status: DC | PRN

## 2013-10-26 MED ORDER — HYDROMORPHONE HCL PF 1 MG/ML IJ SOLN
1.0000 mg | Freq: Once | INTRAMUSCULAR | Status: AC
  Administered 2013-10-26: 1 mg via INTRAVENOUS
  Filled 2013-10-26: qty 1

## 2013-10-26 MED ORDER — PANTOPRAZOLE SODIUM 40 MG PO TBEC
40.0000 mg | DELAYED_RELEASE_TABLET | Freq: Two times a day (BID) | ORAL | Status: DC
  Administered 2013-10-26 – 2013-10-27 (×2): 40 mg via ORAL
  Filled 2013-10-26 (×2): qty 1

## 2013-10-26 MED ORDER — INSULIN GLARGINE 100 UNIT/ML ~~LOC~~ SOLN: 42.0000 [IU] | Freq: Two times a day (BID) | SUBCUTANEOUS | Status: DC | PRN

## 2013-10-26 MED ORDER — ONDANSETRON HCL 4 MG/2ML IJ SOLN: 4.0000 mg | Freq: Four times a day (QID) | INTRAMUSCULAR | Status: DC | PRN

## 2013-10-26 MED ORDER — LISINOPRIL-HYDROCHLOROTHIAZIDE 10-12.5 MG PO TABS: 1.0000 | ORAL_TABLET | Freq: Every morning | ORAL | Status: DC

## 2013-10-26 MED ORDER — INSULIN ASPART 100 UNIT/ML ~~LOC~~ SOLN: 1.0000 [IU] | Freq: Three times a day (TID) | SUBCUTANEOUS | Status: DC

## 2013-10-26 MED ORDER — LISINOPRIL 10 MG PO TABS
10.0000 mg | ORAL_TABLET | Freq: Every day | ORAL | Status: DC
  Administered 2013-10-27: 10 mg via ORAL
  Filled 2013-10-26: qty 1

## 2013-10-26 MED ORDER — INSULIN GLARGINE 100 UNIT/ML ~~LOC~~ SOLN
21.0000 [IU] | Freq: Once | SUBCUTANEOUS | Status: AC
  Administered 2013-10-26: 21 [IU] via SUBCUTANEOUS
  Filled 2013-10-26: qty 0.21

## 2013-10-26 MED ORDER — HYDROCHLOROTHIAZIDE 12.5 MG PO CAPS
12.5000 mg | ORAL_CAPSULE | Freq: Every day | ORAL | Status: DC
  Administered 2013-10-27: 12.5 mg via ORAL
  Filled 2013-10-26: qty 1

## 2013-10-26 MED ORDER — GABAPENTIN 400 MG PO CAPS
800.0000 mg | ORAL_CAPSULE | Freq: Three times a day (TID) | ORAL | Status: DC | PRN
  Filled 2013-10-26: qty 2

## 2013-10-26 MED ORDER — GABAPENTIN 800 MG PO TABS
800.0000 mg | ORAL_TABLET | Freq: Three times a day (TID) | ORAL | Status: DC | PRN
  Filled 2013-10-26: qty 1

## 2013-10-26 MED ORDER — OXYCODONE-ACETAMINOPHEN 10-325 MG PO TABS: 1.0000 | ORAL_TABLET | Freq: Three times a day (TID) | ORAL | Status: DC | PRN

## 2013-10-26 MED ORDER — NITROGLYCERIN 0.4 MG SL SUBL
0.4000 mg | SUBLINGUAL_TABLET | SUBLINGUAL | Status: DC | PRN
  Administered 2013-10-26: 0.4 mg via SUBLINGUAL
  Filled 2013-10-26: qty 25

## 2013-10-26 MED ORDER — INSULIN GLARGINE 100 UNIT/ML ~~LOC~~ SOLN
42.0000 [IU] | Freq: Every day | SUBCUTANEOUS | Status: DC
  Filled 2013-10-26: qty 0.42

## 2013-10-26 MED ORDER — ONDANSETRON 8 MG PO TBDP
8.0000 mg | ORAL_TABLET | Freq: Three times a day (TID) | ORAL | Status: DC | PRN
  Administered 2013-10-26: 8 mg via ORAL
  Filled 2013-10-26: qty 1

## 2013-10-26 MED ORDER — INSULIN ASPART 100 UNIT/ML ~~LOC~~ SOLN
0.0000 [IU] | Freq: Three times a day (TID) | SUBCUTANEOUS | Status: DC
  Administered 2013-10-26: 4 [IU] via SUBCUTANEOUS

## 2013-10-26 NOTE — ED Provider Notes (Signed)
CSN: [id number]     Arrival date & time 10/26/13  1213 History   First MD Initiated Contact with Patient 10/26/13 1304     Chief Complaint  Patient presents with  . Chest Pain   (Consider location/radiation/quality/duration/timing/severity/associated sxs/prior Treatment) Patient is a 63 y.o. female presenting with chest pain. The history is provided by the patient.  Chest Pain Pain location:  Substernal area Pain quality: pressure and sharp   Pain radiates to:  L jaw and L shoulder Pain radiates to the back: no   Pain severity:  Moderate Onset quality:  Sudden Timing:  Constant Progression:  Worsening Chronicity:  New Context: at rest   Context: not breathing, not eating and no stress   Relieved by:  Rest and nitroglycerin (mild relief with nitro) Worsened by:  Nothing tried Associated symptoms: nausea and shortness of breath   Associated symptoms: no abdominal pain, no fever and not vomiting     Past Medical History  Diagnosis Date  . Hypertension   . High cholesterol   . Pacemaker   . Diabetes mellitus   . Arthritis   . Headache(784.0)   . PONV (postoperative nausea and vomiting)   . Coronary artery disease   . Anxiety   . Fibromyalgia   . Dysrhythmia   . 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 Abdominal hysterectomy    . Joint replacement      right knee  . Cholecystectomy      2005-2006  . Insert / replace / remove pacemaker      2008, [employer], Pacemaker  . Fracture surgery      left ankle   Family History  Problem Relation Age of Onset  . Coronary artery disease Mother     s/p stenting, currently 32  . Cancer Father     heavy smoker, died of lung cancer in 37  . Brain cancer Brother     ? CAD   History  Substance Use Topics  . Smoking status: Never Smoker   . Smokeless tobacco: Not on file  . Alcohol Use: No   OB History   Grav Para Term Preterm Abortions TAB SAB Ect Mult Living                  Review of Systems  Constitutional: Negative for fever.  Respiratory: Positive for shortness of breath.   Cardiovascular: Positive for chest pain.  Gastrointestinal: Positive for nausea. Negative for vomiting, abdominal pain and diarrhea.  All other systems reviewed and are negative.    Allergies  Shellfish allergy; Carafate; Cephalexin; Decadron; Erythromycin; Heparin; Iohexol; Ketorolac tromethamine; Methocarbamol; Penicillins; Prednisone; Questran; and Reglan  Home Medications   Current Outpatient Rx  Name  Route  Sig  Dispense  Refill  . aspirin 325 MG tablet   Oral   Take 325 mg by mouth once. For chest pain         . aspirin 81 MG tablet   Oral   Take 81 mg by mouth daily.           Marland Kitchen atenolol (TENORMIN) 25 MG tablet   Oral   Take 25 mg by mouth at bedtime.          . diazepam (VALIUM) 10 MG tablet   Oral   Take 10 mg by mouth 2 (two) times daily as needed. For anxiety         . insulin aspart (  NOVOLOG) 100 UNIT/ML injection   Subcutaneous   Inject 1-10 Units into the skin 3 (three) times daily before meals. Pt takes anywhere from 1-10 units based on sliding scale.         Marland Kitchen EXPIRED: insulin glargine (LANTUS) 100 UNIT/ML injection   Subcutaneous   Inject 38 Units into the skin at bedtime.          Marland Kitchen lisinopril-hydrochlorothiazide (PRINZIDE,ZESTORETIC) 10-12.5 MG per tablet   Oral   Take 1 tablet by mouth every morning.          . metFORMIN (GLUCOPHAGE) 850 MG tablet   Oral   Take 850 mg by mouth 2 (two) times daily with a meal.          . EXPIRED: metoCLOPramide (REGLAN) 10 MG tablet   Oral   Take 1 tablet (10 mg total) by mouth 4 (four) times daily as needed.   30 tablet   0   . EXPIRED: nitroGLYCERIN (NITROSTAT) 0.4 MG SL tablet   Sublingual   Place 0.4 mg under the tongue every 5 (five) minutes as needed. For chest pain.          Marland Kitchen ondansetron (ZOFRAN ODT) 8 MG disintegrating tablet   Oral   Take 1 tablet (8 mg total) by mouth  every 8 (eight) hours as needed for nausea.   20 tablet   0   . EXPIRED: pantoprazole (PROTONIX) 40 MG tablet   Oral   Take 40 mg by mouth 2 (two) times daily before a meal.           . simvastatin (ZOCOR) 40 MG tablet   Oral   Take 40 mg by mouth at bedtime.           BP 149/90  Pulse 67  Temp(Src) 98.4 F (36.9 C) (Oral)  Resp 22  Ht 5\' 5"  (1.651 m)  Wt 205 lb (92.987 kg)  BMI 34.11 kg/m2  SpO2 94%  LMP 09/26/2011 Physical Exam  Nursing note and vitals reviewed. Constitutional: She is oriented to person, place, and time. She appears well-developed and well-nourished. No distress.  HENT:  Head: Normocephalic and atraumatic.  Eyes: EOM are normal. Pupils are equal, round, and reactive to light.  Neck: Normal range of motion. Neck supple.  Cardiovascular: Normal rate and regular rhythm.  Exam reveals no friction rub.   No murmur heard. Pulmonary/Chest: Effort normal and breath sounds normal. No respiratory distress. She has no wheezes. She has no rales.  Abdominal: Soft. She exhibits no distension. There is no tenderness. There is no rebound.  Musculoskeletal: Normal range of motion. She exhibits no edema.  Neurological: She is alert and oriented to person, place, and time.  Skin: No rash noted. She is not diaphoretic.    ED Course  Procedures (including critical care time) Labs Review Labs Reviewed  BASIC METABOLIC PANEL - Abnormal; Notable for the following:    Glucose, Bld 280 (*)    All other components within normal limits  GLUCOSE, CAPILLARY - Abnormal; Notable for the following:    Glucose-Capillary 173 (*)    All other components within normal limits  PRO B NATRIURETIC PEPTIDE  CBC  HEMOGLOBIN A1C  TROPONIN I  TROPONIN I  TROPONIN I  POCT I-STAT TROPONIN I   Imaging Review Dg Chest 2 View  10/26/2013   CLINICAL DATA:  Left chest and shoulder pain.  EXAM: CHEST  2 VIEW  COMPARISON:  06/22/2013.  FINDINGS: Stable poor inspiration and enlargement of  the cardiac silhouette. Stable left subclavian pacemaker leads. Clear lungs with normal vascularity. Stable minimally prominent interstitial markings. Cholecystectomy clips. Lower thoracic spine degenerative changes.  IMPRESSION: No acute abnormality. Stable cardiomegaly and minimal chronic interstitial lung disease.   Electronically Signed   By: Enrique Sack M.D.   On: 10/26/2013 14:45    EKG Interpretation    Date/Time:  Sunday October 26 2013 12:18:52 EST Ventricular Rate:  75 PR Interval:  184 QRS Duration: 78 QT Interval:  380 QTC Calculation: 424 R Axis:   24 Text Interpretation:  Atrial-paced rhythm ST \\T \ T wave abnormality, consider inferior ischemia ST \\T \ T wave abnormality, consider anterolateral ischemia Abnormal ECG Similar to prior from 1.5 years ago Confirmed by Mingo Amber  MD, Benjie Karvonen [phone]) on 10/26/2013 1:04:17 PM           Angiocath insertion Performed by: Osvaldo Shipper  Consent: Verbal consent obtained. Risks and benefits: risks, benefits and alternatives were discussed Time out: Immediately prior to procedure a "time out" was called to verify the correct patient, procedure, equipment, support staff and site/side marked as required.  Preparation: Patient was prepped and draped in the usual sterile fashion.  Vein Location: R AC  Yes Ultrasound Guided  Gauge: 18  Normal blood return and flush without difficulty Patient tolerance: Patient tolerated the procedure well with no immediate complications.    MDM   1. Chest pain   2. Diabetes mellitus type 2, controlled   3. Fibromyalgia   4. Morbid obesity, BMI not known    49F with hx of pacemaker for low HR presents with CP concerning for ACS. Pressure, constant, radiating to L jaw and arm. Nitro at home with little to no relief. Here AFVSS. Exam benign. EKG similar to prior. Will treat wit ASA, NTG, morphine, CXR, labs. Anticipate admission.  Labs all normal. Narcotics helping pain. Had cath 1.5 years  ago for similar CP, nonobstructive disease seen. Patient admitted to medicine.     Osvaldo Shipper, MD 10/26/13 2003

## 2013-10-26 NOTE — ED Notes (Signed)
Admitting MD at bedside.

## 2013-10-26 NOTE — H&P (Signed)
[facility] History and Physical  ANALILIA Zimmerman [id number] DOB: 1951/02/28 DOA: 10/26/2013  Referring physician: er PCP: Michaela Nasuti, MD   Chief Complaint: chest pain  HPI: [name] is a 63 y.o. female  Who comes in with chest pain- radiating to her neck and should but not down her arm.  She had an admission back in 2013 with similar complaints and had a cath then that showed nonobstructive minimal cardiac disease and therefore noncardiac etiology to her chest pain. At that time her Hemoglobin A1c was elevated at 11.4 and suspect gastroparesis as primary etiology to patient's chest pain and other GI symptomatology.  In the ER, her pain was relieved lessened with dilaudid.  Nitro gave her a headache.  CE were negative and EKG unchanged She was given ASA, nitro, morphine, dilaudid Hospitalist were aske to admit for r/o   Review of Systems:  All systems reviewed, negative unless stated above  Past Medical History  Diagnosis Date  . Hypertension   . High cholesterol   . Pacemaker   . Diabetes mellitus   . Arthritis   . Headache(784.0)   . PONV (postoperative nausea and vomiting)   . Coronary artery disease   . Anxiety   . Fibromyalgia   . Dysrhythmia   . 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 Abdominal hysterectomy    . Joint replacement      right knee  . Cholecystectomy      2005-2006  . Insert / replace / remove pacemaker      2008, [employer], Pacemaker  . Fracture surgery      left ankle   Social History:  reports that she has never smoked. She does not have any smokeless tobacco history on file. She reports that she does not drink alcohol or use illicit drugs.  Allergies  Allergen Reactions  . Shellfish Allergy Anaphylaxis  . Carafate [Sucralfate] Nausea And Vomiting  . Cephalexin Nausea And Vomiting  . Decadron [Dexamethasone] Nausea And Vomiting  . Erythromycin Itching  . Heparin     Nosebleed   . Iohexol      Desc: pt states reaction is anaphalactic shock from IV dye and any kind of shellfish. pt states she almost died the last time she had a reaction.   Marland Kitchen Ketorolac Tromethamine Other (See Comments)    unknown  . Methocarbamol Itching and Nausea And Vomiting  . Penicillins Itching  . Prednisone Itching and Nausea And Vomiting    Jitteriness & severe itching  . Questran Nausea And Vomiting  . Reglan [Metoclopramide] Nausea And Vomiting    Family History  Problem Relation Age of Onset  . Coronary artery disease Mother     s/p stenting, currently 57  . Cancer Father     heavy smoker, died of lung cancer in 71  . Brain cancer Brother     ? CAD     Prior to Admission medications   Medication Sig Start Date End Date Taking? Authorizing Provider  ALPRAZolam Duanne Moron) 1 MG tablet Take 1 mg by mouth 2 (two) times daily.   Yes Historical Provider, MD  aspirin 81 MG tablet Take 81 mg by mouth daily.     Yes Historical Provider, MD  gabapentin (NEURONTIN) 800 MG tablet Take 800 mg by mouth 3 (three) times daily as needed (pain).   Yes Historical Provider, MD  insulin aspart (NOVOLOG) 100 UNIT/ML injection Inject 1-10  Units into the skin 3 (three) times daily before meals. Pt takes anywhere from 1-10 units based on sliding scale.   Yes Historical Provider, MD  insulin glargine (LANTUS) 100 UNIT/ML injection Inject 42 Units into the skin 2 (two) times daily as needed (based on sugar levels).  03/20/12 10/26/13 Yes Samella Parr, NP  lisinopril-hydrochlorothiazide (PRINZIDE,ZESTORETIC) 10-12.5 MG per tablet Take 1 tablet by mouth every morning.    Yes Historical Provider, MD  metFORMIN (GLUCOPHAGE) 850 MG tablet Take 850 mg by mouth 2 (two) times daily with a meal.    Yes Historical Provider, MD  metoprolol tartrate (LOPRESSOR) 25 MG tablet Take 25 mg by mouth at bedtime.   Yes Historical Provider, MD  ondansetron (ZOFRAN ODT) 8 MG disintegrating tablet Take 1 tablet (8 mg total) by mouth  every 8 (eight) hours as needed for nausea. 06/16/12  Yes Heather Laisure, PA-C  oxyCODONE-acetaminophen (PERCOCET) 10-325 MG per tablet Take 1 tablet by mouth 3 (three) times daily as needed for pain.   Yes Historical Provider, MD  pantoprazole (PROTONIX) 40 MG tablet Take 40 mg by mouth 2 (two) times daily before a meal.   08/01/11 10/26/13 Yes Belkys A Regalado, MD  rosuvastatin (CRESTOR) 20 MG tablet Take 20 mg by mouth at bedtime.   Yes Historical Provider, MD  tiZANidine (ZANAFLEX) 4 MG tablet Take 4 mg by mouth every 6 (six) hours as needed for muscle spasms.   Yes Historical Provider, MD  nitroGLYCERIN (NITROSTAT) 0.4 MG SL tablet Place 0.4 mg under the tongue every 5 (five) minutes as needed. For chest pain.  07/26/11 07/25/12  Shanker Kristeen Mans, MD   Physical Exam: Filed Vitals:   10/26/13 1508  BP: 160/83  Pulse: 69  Temp:   Resp: 18    BP 160/83  Pulse 69  Temp(Src) 98.6 F (37 C) (Oral)  Resp 18  Ht 5\' 5"  (1.651 m)  Wt 92.987 kg (205 lb)  BMI 34.11 kg/m2  SpO2 96%  LMP 09/26/2011  General:  Appears calm and comfortable Eyes: PERRL, normal lids, irises & conjunctiva ENT: grossly normal hearing, lips & tongue Neck: no LAD, masses or thyromegaly Cardiovascular: RRR, no m/r/g. No LE edema. Respiratory: CTA bilaterally, no w/r/r. Normal respiratory effort. Abdomen: soft, ntnd Skin: no rash or induration seen on limited exam Musculoskeletal: grossly normal tone BUE/BLE Psychiatric: grossly normal mood and affect, speech fluent and appropriate Neurologic: grossly non-focal.          Labs on Admission:  Basic Metabolic Panel:  Recent Labs Lab 10/26/13 1340  NA 141  K 4.0  CL 101  CO2 24  GLUCOSE 280*  BUN 9  CREATININE 0.62  CALCIUM 9.4   Liver Function Tests: No results found for this basename: AST, ALT, ALKPHOS, BILITOT, PROT, ALBUMIN,  in the last 168 hours No results found for this basename: LIPASE, AMYLASE,  in the last 168 hours No results found for  this basename: AMMONIA,  in the last 168 hours CBC:  Recent Labs Lab 10/26/13 1340  WBC 10.4  HGB 15.0  HCT 42.5  MCV 85.7  PLT 224   Cardiac Enzymes: No results found for this basename: CKTOTAL, CKMB, CKMBINDEX, TROPONINI,  in the last 168 hours  BNP (last 3 results)  Recent Labs  10/26/13 1340  PROBNP 35.5   CBG: No results found for this basename: GLUCAP,  in the last 168 hours  Radiological Exams on Admission: Dg Chest 2 View  10/26/2013   CLINICAL DATA:  Left chest and shoulder pain.  EXAM: CHEST  2 VIEW  COMPARISON:  06/22/2013.  FINDINGS: Stable poor inspiration and enlargement of the cardiac silhouette. Stable left subclavian pacemaker leads. Clear lungs with normal vascularity. Stable minimally prominent interstitial markings. Cholecystectomy clips. Lower thoracic spine degenerative changes.  IMPRESSION: No acute abnormality. Stable cardiomegaly and minimal chronic interstitial lung disease.   Electronically Signed   By: Enrique Sack M.D.   On: 10/26/2013 14:45    EKG: Independently reviewed. Similar to previous  Assessment/Plan Active Problems:   Hypertension, benign   Hyperlipemia, mixed   DM (diabetes mellitus), type 2, uncontrolled   Fibromyalgia   Chest pain   Chest pain- similar episode in 2013- cath showed nonobstructive minimal cardiac disease and therefore noncardiac etiology to her chest pain. At that time her Hemoglobin A1c was elevated at 11.4 and suspect gastroparesis as primary etiology to patient's chest pain and other GI symptomatology -3E CP obs unit -GI cocktail -allergy to reglan -cycle CE -tele  Fibromyalgia  Morbid obesity  HTN  Uncontrolled DM- SSI, lantus -hold metformin    Code Status: *full Family Communication: patient Disposition Plan: obs  Time spent: 75 min  [facility], [facility] Pager [phone]

## 2013-10-26 NOTE — ED Notes (Signed)
States shes had CP since this am. She took 3 nitro and 81 mg ASA with minimal pain relief. States the pain is midsternal and radiates into her L shoulder and jaw. Shes also felt clammy and SOB

## 2013-10-27 DIAGNOSIS — I10 Essential (primary) hypertension: Secondary | ICD-10-CM

## 2013-10-27 DIAGNOSIS — K219 Gastro-esophageal reflux disease without esophagitis: Secondary | ICD-10-CM

## 2013-10-27 DIAGNOSIS — F341 Dysthymic disorder: Secondary | ICD-10-CM

## 2013-10-27 DIAGNOSIS — E782 Mixed hyperlipidemia: Secondary | ICD-10-CM

## 2013-10-27 LAB — GLUCOSE, CAPILLARY: GLUCOSE-CAPILLARY: 199 mg/dL — AB (ref 70–99)

## 2013-10-27 LAB — HEMOGLOBIN A1C
Hgb A1c MFr Bld: 10.6 % — ABNORMAL HIGH (ref <5.7)
Mean Plasma Glucose: 258 mg/dL — ABNORMAL HIGH (ref <117)

## 2013-10-27 LAB — TROPONIN I: Troponin I: <0.3 ng/mL (ref <0.30)

## 2013-10-27 MED ORDER — FAMOTIDINE 20 MG PO TABS: 20.0000 mg | ORAL_TABLET | Freq: Every day | ORAL | Status: DC

## 2013-10-27 MED ORDER — PANTOPRAZOLE SODIUM 40 MG PO TBEC: 40.0000 mg | DELAYED_RELEASE_TABLET | Freq: Two times a day (BID) | ORAL | Status: AC

## 2013-10-27 NOTE — Progress Notes (Signed)
Chest pain unit rounds:  Chart reviewed.  Patient examined. Cardiac enzymes are negative. EKG shows widespread T wave changes unchanged from 2013. She is painfree this am and anxious for discharge. She has an appointment already for followup with her cardiologist in [place]. No further inpatient testing necessary. Consider outpatient myoview.

## 2013-10-27 NOTE — Discharge Summary (Signed)
Physician Discharge Summary  [name] LKG:[id number] DOB: 1951-01-05 DOA: 10/26/2013  PCP: Bonnita Nasuti, MD  Admit date: 10/26/2013 Discharge date: 10/27/2013  Time spent: >30 minutes  Recommendations for Outpatient Follow-up:  1. BMET to follow renal function and electrolytes 2. Follow GERD symptoms and adjust medications as needed 3. Needs follow with cardiology for Myoview as an outpatient.  Discharge Diagnoses:  Active Problems:   Hypertension, benign   Hyperlipemia, mixed   DM (diabetes mellitus), type 2, uncontrolled   Fibromyalgia   Chest pain GERD   Discharge Condition: stable and improved. No CP, no SOB. Will discharge home. Follow up with PCP in 10 days and outpatient Myoview with cardiologist.  Diet recommendation: low sodium diet/heart healthy and low carbohydrates diet.  Filed Weights   10/26/13 1218 10/26/13 1624  Weight: 92.987 kg (205 lb) 87.9 kg (193 lb 12.6 oz)    History of present illness:  63 y.o. female  Who comes in with chest pain- radiating to her neck and should but not down her arm. She had an admission back in 2013 with similar complaints and had a cath then that showed nonobstructive minimal cardiac disease and therefore noncardiac etiology to her chest pain. At that time her Hemoglobin A1c was elevated at 11.4 and suspect gastroparesis as primary etiology to patient's chest pain and other GI symptomatology.  In the ER, her pain was relieved lessened with dilaudid. Nitro gave her a headache. CE were negative and EKG unchanged   Hospital Course:  1-Chest pain: appears to be related to GERD and/or fibromyalgia -patient CP free the day of discharge -CE neg X 3, no abnormalities to suggest ischemia on EKG and/or telemetry -cardiology consulted and recommendations are for outpatient Myoview -will continue ASA, b-blocker, statins and control of her diabetes. -for GERD will continue protonix BID and will also start famotidine QHS.. -if symptoms  continue, consider performing gastric emptying study to r/o gastroparesis (at high risk wih uncontrolled DM)  2-GERD: continue PPI. Will start famotidine QHS  3-DM type 2: will continue home regimen and further adjustments to hypoglycemic regimen as per PCP. -patient advise to follow low carb diet  4-HLD: continue statins  5-HTN: fairly well controlle, continue current regimen  6-Fibromyalgia/Chronic pain: will continue PRN home pain meds; needs to follow with PCP for refills and pain meds adjustments.  *Rest of medical problems, remains stable during hospitalization and the plan is to continue current medication regimen and outpatient follow up with PCP  Procedures:  See below for x-ray reports   Consultations:  Cardiology   Discharge Exam: Filed Vitals:   10/27/13 0520  BP: 148/66  Pulse: 76  Temp: 98.5 F (36.9 C)  Resp: 15    General: afebrile, no CP, no SOB Cardiovascular: S1 and S2, no rubs or gallops Respiratory: CTA bilaterally, no wheezing, no crackles Abdomen: soft, NT, ND, positive BS Extremities: no cyanosis or clubbing Neurology: non focal   Discharge Instructions  Discharge Orders   Future Orders Complete By Expires   Discharge instructions  As directed    Comments:     Arrange follow up with PCP in 10 days Take medications as prescribed Follow low sodium/heart healthy diet       Medication List         ALPRAZolam 1 MG tablet  Commonly known as:  XANAX  Take 1 mg by mouth 2 (two) times daily.     aspirin 81 MG tablet  Take 81 mg by mouth daily.  famotidine 20 MG tablet  Commonly known as:  PEPCID  Take 1 tablet (20 mg total) by mouth at bedtime.     gabapentin 800 MG tablet  Commonly known as:  NEURONTIN  Take 800 mg by mouth 3 (three) times daily as needed (pain).     insulin aspart 100 UNIT/ML injection  Commonly known as:  novoLOG  Inject 1-10 Units into the skin 3 (three) times daily before meals. Pt takes anywhere from  1-10 units based on sliding scale.     insulin glargine 100 UNIT/ML injection  Commonly known as:  LANTUS  Inject 42 Units into the skin at bedtime.     lisinopril-hydrochlorothiazide 10-12.5 MG per tablet  Commonly known as:  PRINZIDE,ZESTORETIC  Take 1 tablet by mouth every morning.     metFORMIN 850 MG tablet  Commonly known as:  GLUCOPHAGE  Take 850 mg by mouth 2 (two) times daily with a meal.     metoprolol tartrate 25 MG tablet  Commonly known as:  LOPRESSOR  Take 25 mg by mouth at bedtime.     nitroGLYCERIN 0.4 MG SL tablet  Commonly known as:  NITROSTAT  Place 0.4 mg under the tongue every 5 (five) minutes as needed. For chest pain.     ondansetron 8 MG disintegrating tablet  Commonly known as:  ZOFRAN ODT  Take 1 tablet (8 mg total) by mouth every 8 (eight) hours as needed for nausea.     oxyCODONE-acetaminophen 10-325 MG per tablet  Commonly known as:  PERCOCET  Take 1 tablet by mouth 3 (three) times daily as needed for pain.     pantoprazole 40 MG tablet  Commonly known as:  PROTONIX  Take 1 tablet (40 mg total) by mouth 2 (two) times daily before a meal.     rosuvastatin 20 MG tablet  Commonly known as:  CRESTOR  Take 20 mg by mouth at bedtime.     tiZANidine 4 MG tablet  Commonly known as:  ZANAFLEX  Take 4 mg by mouth every 6 (six) hours as needed for muscle spasms.       Allergies  Allergen Reactions  . Shellfish Allergy Anaphylaxis  . Carafate [Sucralfate] Nausea And Vomiting  . Cephalexin Nausea And Vomiting  . Decadron [Dexamethasone] Nausea And Vomiting  . Erythromycin Itching  . Heparin     Nosebleed  . Iohexol      Desc: pt states reaction is anaphalactic shock from IV dye and any kind of shellfish. pt states she almost died the last time she had a reaction.   Marland Kitchen Ketorolac Tromethamine Other (See Comments)    unknown  . Methocarbamol Itching and Nausea And Vomiting  . Penicillins Itching  . Prednisone Itching and Nausea And Vomiting     Jitteriness & severe itching  . Questran Nausea And Vomiting  . Reglan [Metoclopramide] Nausea And Vomiting       Follow-up Information   Follow up with HAGUE, Rosalyn Charters, MD. Schedule an appointment as soon as possible for a visit in 10 days.   Specialty:  Internal Medicine   Contact information:   [address] [place] [address] [phone]       Call Asif Sherwood Gambler, MD. (office for appointment confirmation.)    Specialty:  Internal Medicine   Contact information:   [place] [id number] [phone]        The results of significant diagnostics from this hospitalization (including imaging, microbiology, ancillary and laboratory)  are listed below for reference.    Significant Diagnostic Studies: Dg Chest 2 View  10/26/2013   CLINICAL DATA:  Left chest and shoulder pain.  EXAM: CHEST  2 VIEW  COMPARISON:  06/22/2013.  FINDINGS: Stable poor inspiration and enlargement of the cardiac silhouette. Stable left subclavian pacemaker leads. Clear lungs with normal vascularity. Stable minimally prominent interstitial markings. Cholecystectomy clips. Lower thoracic spine degenerative changes.  IMPRESSION: No acute abnormality. Stable cardiomegaly and minimal chronic interstitial lung disease.   Electronically Signed   By: Enrique Sack M.D.   On: 10/26/2013 14:45   Labs: Basic Metabolic Panel:  Recent Labs Lab 10/26/13 1340  NA 141  K 4.0  CL 101  CO2 24  GLUCOSE 280*  BUN 9  CREATININE 0.62  CALCIUM 9.4   CBC:  Recent Labs Lab 10/26/13 1340  WBC 10.4  HGB 15.0  HCT 42.5  MCV 85.7  PLT 224   Cardiac Enzymes:  Recent Labs Lab 10/26/13 1914 10/27/13 0105  TROPONINI <0.30 <0.30   BNP: BNP (last 3 results)  Recent Labs  10/26/13 1340  PROBNP 35.5   CBG:  Recent Labs Lab 10/26/13 1629 10/26/13 2101 10/27/13 0726  GLUCAP 173* 181* 199*     Signed:  Keats Kingry  [facility] 10/27/2013, 8:52 AM

## 2013-10-27 NOTE — Discharge Instructions (Signed)
Chest Pain Observation °It is often hard to give a specific diagnosis for the cause of chest pain. Among other possibilities your symptoms might be caused by inadequate oxygen delivery to your heart (angina). Angina that is not treated or evaluated can lead to a heart attack (myocardial infarction) or death. °Blood tests, electrocardiograms, and X-rays may have been done to help determine a possible cause of your chest pain. After evaluation and observation, your health care provider has determined that it is unlikely your pain was caused by an unstable condition that requires hospitalization. However, a full evaluation of your pain may need to be completed, with additional diagnostic testing as directed. It is very important to keep your follow-up appointments. Not keeping your follow-up appointments could result in permanent heart damage, disability, or death. If there is any problem keeping your follow-up appointments, you must call your health care provider. °HOME CARE INSTRUCTIONS  °Due to the slight chance that your pain could be angina, it is important to follow your health care provider's treatment plan and also maintain a healthy lifestyle: °· Maintain or work toward achieving a healthy weight. °· Stay physically active and exercise regularly. °· Decrease your salt intake. °· Eat a balanced, healthy diet. Talk to a dietician to learn about heart healthy foods. °· Increase your fiber intake by including whole grains, vegetables, fruits, and nuts in your diet. °· Avoid situations that cause stress, anger, or depression. °· Take medicines as advised by your health care provider. Report any side effects to your health care provider. Do not stop medicines or adjust the dosages on your own. °· Quit smoking. Do not use nicotine patches or gum until you check with your health care provider. °· Keep your blood pressure, blood sugar, and cholesterol levels within normal limits. °· Limit alcohol intake to no more than  1 drink per day for women that are not pregnant and 2 drinks per day for men. °· Do not abuse drugs. °SEEK IMMEDIATE MEDICAL CARE IF: °You have severe chest pain or pressure which may include symptoms such as: °· You feel pain or pressure in you arms, neck, jaw, or back. °· You have severe back or abdominal pain, feel sick to your stomach (nauseous), or throw up (vomit). °· You are sweating profusely. °· You are having a fast or irregular heartbeat. °· You feel short of breath while at rest. °· You notice increasing shortness of breath during rest, sleep, or with activity. °· You have chest pain that does not get better after rest or after taking your usual medicine. °· You wake from sleep with chest pain. °· You are unable to sleep because you cannot breathe. °· You develop a frequent cough or you are coughing up blood. °· You feel dizzy, faint, or experience extreme fatigue. °· You develop severe weakness, dizziness, fainting, or chills. °Any of these symptoms may represent a serious problem that is an emergency. Do not wait to see if the symptoms will go away. Call your local emergency services (911 in the [place] Do not drive yourself to the hospital. °MAKE SURE YOU: °· Understand these instructions. °· Will watch your condition. °· Will get help right away if you are not doing well or get worse. °Document Released: 10/07/2010 Document Revised: 05/07/2013 Document Reviewed: 03/06/2013 °ExitCare® Patient Information ©2014 [employer]. ° °

## 2013-11-29 ENCOUNTER — Encounter: Payer: Self-pay | Admitting: *Deleted

## 2014-08-27 ENCOUNTER — Encounter (HOSPITAL_COMMUNITY): Payer: Self-pay | Admitting: Cardiology

## 2015-11-27 ENCOUNTER — Encounter (HOSPITAL_COMMUNITY): Payer: Self-pay | Admitting: Emergency Medicine

## 2015-11-27 ENCOUNTER — Observation Stay (HOSPITAL_COMMUNITY)
Admission: EM | Admit: 2015-11-27 | Discharge: 2015-11-29 | Disposition: A | Discharge status: Home / Self Care | Payer: Medicare Other | Attending: Internal Medicine | Admitting: Internal Medicine

## 2015-11-27 ENCOUNTER — Emergency Department (HOSPITAL_COMMUNITY): Payer: Medicare Other

## 2015-11-27 DIAGNOSIS — I10 Essential (primary) hypertension: Secondary | ICD-10-CM | POA: Diagnosis not present

## 2015-11-27 DIAGNOSIS — Z880 Allergy status to penicillin: Secondary | ICD-10-CM | POA: Insufficient documentation

## 2015-11-27 DIAGNOSIS — E876 Hypokalemia: Secondary | ICD-10-CM | POA: Diagnosis not present

## 2015-11-27 DIAGNOSIS — K219 Gastro-esophageal reflux disease without esophagitis: Secondary | ICD-10-CM | POA: Diagnosis not present

## 2015-11-27 DIAGNOSIS — E7800 Pure hypercholesterolemia, unspecified: Secondary | ICD-10-CM | POA: Diagnosis not present

## 2015-11-27 DIAGNOSIS — E782 Mixed hyperlipidemia: Secondary | ICD-10-CM | POA: Diagnosis not present

## 2015-11-27 DIAGNOSIS — Z8249 Family history of ischemic heart disease and other diseases of the circulatory system: Secondary | ICD-10-CM | POA: Insufficient documentation

## 2015-11-27 DIAGNOSIS — I495 Sick sinus syndrome: Secondary | ICD-10-CM | POA: Insufficient documentation

## 2015-11-27 DIAGNOSIS — Z950 Presence of cardiac pacemaker: Secondary | ICD-10-CM | POA: Diagnosis not present

## 2015-11-27 DIAGNOSIS — Z66 Do not resuscitate: Secondary | ICD-10-CM | POA: Diagnosis not present

## 2015-11-27 DIAGNOSIS — Z9049 Acquired absence of other specified parts of digestive tract: Secondary | ICD-10-CM | POA: Diagnosis not present

## 2015-11-27 DIAGNOSIS — IMO0002 Reserved for concepts with insufficient information to code with codable children: Secondary | ICD-10-CM | POA: Diagnosis present

## 2015-11-27 DIAGNOSIS — Z794 Long term (current) use of insulin: Secondary | ICD-10-CM | POA: Insufficient documentation

## 2015-11-27 DIAGNOSIS — M797 Fibromyalgia: Secondary | ICD-10-CM | POA: Insufficient documentation

## 2015-11-27 DIAGNOSIS — I2510 Atherosclerotic heart disease of native coronary artery without angina pectoris: Secondary | ICD-10-CM | POA: Diagnosis present

## 2015-11-27 DIAGNOSIS — E1165 Type 2 diabetes mellitus with hyperglycemia: Secondary | ICD-10-CM | POA: Diagnosis not present

## 2015-11-27 DIAGNOSIS — R079 Chest pain, unspecified: Secondary | ICD-10-CM | POA: Diagnosis not present

## 2015-11-27 DIAGNOSIS — E1169 Type 2 diabetes mellitus with other specified complication: Secondary | ICD-10-CM

## 2015-11-27 DIAGNOSIS — Z7982 Long term (current) use of aspirin: Secondary | ICD-10-CM | POA: Diagnosis not present

## 2015-11-27 LAB — CBC
HCT: 44.1 % (ref 36.0–46.0)
Hemoglobin: 15.4 g/dL — ABNORMAL HIGH (ref 12.0–15.0)
MCH: 29.7 pg (ref 26.0–34.0)
MCHC: 34.9 g/dL (ref 30.0–36.0)
MCV: 85.1 fL (ref 78.0–100.0)
PLATELETS: 191 10*3/uL (ref 150–400)
RBC: 5.18 MIL/uL — ABNORMAL HIGH (ref 3.87–5.11)
RDW: 13.1 % (ref 11.5–15.5)
WBC: 7.9 10*3/uL (ref 4.0–10.5)

## 2015-11-27 LAB — BASIC METABOLIC PANEL
Anion gap: 15 (ref 5–15)
BUN: 6 mg/dL (ref 6–20)
CHLORIDE: 101 mmol/L (ref 101–111)
CO2: 22 mmol/L (ref 22–32)
CREATININE: 0.71 mg/dL (ref 0.44–1.00)
Calcium: 8.9 mg/dL (ref 8.9–10.3)
GFR calc Af Amer: >60 mL/min (ref >60)
GFR calc non Af Amer: >60 mL/min (ref >60)
GLUCOSE: 213 mg/dL — AB (ref 65–99)
Potassium: 3.4 mmol/L — ABNORMAL LOW (ref 3.5–5.1)
SODIUM: 138 mmol/L (ref 135–145)

## 2015-11-27 LAB — I-STAT TROPONIN, ED: Troponin i, poc: 0 ng/mL (ref 0.00–0.08)

## 2015-11-27 MED ORDER — ONDANSETRON HCL 4 MG/2ML IJ SOLN
4.0000 mg | Freq: Once | INTRAMUSCULAR | Status: AC
  Administered 2015-11-27: 4 mg via INTRAVENOUS
  Filled 2015-11-27: qty 2

## 2015-11-27 MED ORDER — GI COCKTAIL ~~LOC~~
30.0000 mL | Freq: Once | ORAL | Status: AC
  Administered 2015-11-27: 30 mL via ORAL
  Filled 2015-11-27: qty 30

## 2015-11-27 MED ORDER — ASPIRIN 81 MG PO CHEW
162.0000 mg | CHEWABLE_TABLET | Freq: Once | ORAL | Status: AC
  Administered 2015-11-27: 162 mg via ORAL
  Filled 2015-11-27: qty 2

## 2015-11-27 MED ORDER — HYDROMORPHONE HCL 1 MG/ML IJ SOLN
0.5000 mg | Freq: Once | INTRAMUSCULAR | Status: AC
  Administered 2015-11-27: 0.5 mg via INTRAVENOUS
  Filled 2015-11-27: qty 1

## 2015-11-27 MED ORDER — FAMOTIDINE IN NACL 20-0.9 MG/50ML-% IV SOLN
20.0000 mg | Freq: Once | INTRAVENOUS | Status: AC
  Administered 2015-11-27: 20 mg via INTRAVENOUS
  Filled 2015-11-27: qty 50

## 2015-11-27 NOTE — ED Provider Notes (Signed)
CSN: [id number]     Arrival date & time 11/27/15  1625 History   First MD Initiated Contact with Patient 11/27/15 2050     Chief Complaint  Patient presents with  . Chest Pain     (Consider location/radiation/quality/duration/timing/severity/associated sxs/prior Treatment) HPI   Blood pressure 137/82, pulse 77, temperature 98.6 F (37 C), temperature source Oral, resp. rate 16, height 5\' 5"  (1.651 m), weight 82.555 kg, last menstrual period 09/26/2011, SpO2 96 %.  [name] is a 65 y.o. female complaining of retrosternal chest pain onset this morning at 6 AM radiating to the left neck jaw and down left arm with associated diaphoresis, nausea and 6 episodes of NBNB non-coffee-ground emesis. Patient took 4 nitroglycerin today with no relief of chest, just caused headache. She had 281 mg aspirins. States pain is exacerbated by exertion. 8 out of 10 at worst, 8 out of 10 right now, described as pressure-like. He denies increasing peripheral edema, orthopnea, PND, history of CHF, history of DVT/PE, recent immobilizations, calf pain or leg swelling, cough, fever. No prior heart attacks or stents. Patient is diabetic, hypertension, hyperlipidemia with nonobstructive CAD.   Cards: Boykin Nearing  Past Medical History  Diagnosis Date  . Hypertension   . High cholesterol   . Pacemaker   . Diabetes mellitus   . Arthritis   . Headache(784.0)   . PONV (postoperative nausea and vomiting)   . Coronary artery disease   . Anxiety   . Fibromyalgia   . Dysrhythmia   . 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 Abdominal hysterectomy    . Joint replacement      right knee  . Cholecystectomy      2005-2006  . Insert / replace / remove pacemaker      2008, [employer], Pacemaker  . Fracture surgery      left ankle  . Left heart catheterization with coronary angiogram N/A 03/19/2012    Procedure: LEFT HEART CATHETERIZATION WITH CORONARY ANGIOGRAM;   Surgeon: Peter M Martinique, MD;  Location: [facility] CATH LAB;  Service: Cardiovascular;  Laterality: N/A;   Family History  Problem Relation Age of Onset  . Coronary artery disease Mother     s/p stenting, currently 60  . Cancer Father     heavy smoker, died of lung cancer in 49  . Brain cancer Brother     ? CAD   Social History  Substance Use Topics  . Smoking status: Never Smoker   . Smokeless tobacco: Never Used  . Alcohol Use: No   OB History    No data available     Review of Systems  10 systems reviewed and found to be negative, except as noted in the HPI.   Allergies  Iodinated diagnostic agents; Shellfish allergy; Shellfish-derived products; Carafate; Cephalexin; Cephalexin; Cholestyramine; Cyclobenzaprine; Decadron; Dexamethasone sodium phosphate; Erythromycin; Erythromycin base; Heparin; Iohexol; Ketorolac; Ketorolac tromethamine; Ketorolac tromethamine; Levofloxacin; Methocarbamol; Methocarbamol; Morphine and related; Other; Oxycodone; Penicillins; Prednisone; Questran; Reglan; and Tape  Home Medications   Prior to Admission medications   Medication Sig Start Date End Date Taking? Authorizing Provider  ALPRAZolam Duanne Moron) 1 MG tablet Take 0.5 mg by mouth 2 (two) times daily as needed for anxiety or sleep.    Yes Historical Provider, MD  aspirin 81 MG tablet Take 81 mg by mouth daily.     Yes Historical Provider, MD  gabapentin (NEURONTIN) 800 MG tablet Take 800 mg by  mouth 3 (three) times daily as needed (pain).   Yes Historical Provider, MD  insulin aspart (NOVOLOG) 100 UNIT/ML injection Inject 1-10 Units into the skin 3 (three) times daily before meals. Pt takes anywhere from 1-10 units based on sliding scale.   Yes Historical Provider, MD  insulin glargine (LANTUS) 100 UNIT/ML injection Inject 42 Units into the skin every morning.    Yes Historical Provider, MD  lisinopril-hydrochlorothiazide (PRINZIDE,ZESTORETIC) 10-12.5 MG per tablet Take 1 tablet by mouth every morning.     Yes Historical Provider, MD  metFORMIN (GLUCOPHAGE) 850 MG tablet Take 850 mg by mouth 2 (two) times daily with a meal.    Yes Historical Provider, MD  metoprolol tartrate (LOPRESSOR) 25 MG tablet Take 25 mg by mouth at bedtime.   Yes Historical Provider, MD  nitroGLYCERIN (NITROSTAT) 0.4 MG SL tablet Place 0.4 mg under the tongue every 5 (five) minutes as needed. For chest pain.  07/26/11 11/27/15 Yes Shanker Kristeen Mans, MD  ondansetron (ZOFRAN ODT) 8 MG disintegrating tablet Take 1 tablet (8 mg total) by mouth every 8 (eight) hours as needed for nausea. 06/16/12  Yes Heather Laisure, PA-C  oxyCODONE-acetaminophen (PERCOCET) 10-325 MG per tablet Take 1 tablet by mouth 3 (three) times daily as needed for pain.   Yes Historical Provider, MD  pantoprazole (PROTONIX) 40 MG tablet Take 1 tablet (40 mg total) by mouth 2 (two) times daily before a meal. 10/27/13  Yes Barton Dubois, MD  rosuvastatin (CRESTOR) 20 MG tablet Take 20 mg by mouth at bedtime.   Yes Historical Provider, MD  tiZANidine (ZANAFLEX) 4 MG tablet Take 4 mg by mouth every 6 (six) hours as needed for muscle spasms.   Yes Historical Provider, MD  famotidine (PEPCID) 20 MG tablet Take 1 tablet (20 mg total) by mouth at bedtime. 10/27/13   Barton Dubois, MD   BP 141/74 mmHg  Pulse 73  Temp(Src) 98.6 F (37 C) (Oral)  Resp 13  Ht 5\' 5"  (1.651 m)  Wt 82.555 kg  BMI 30.29 kg/m2  SpO2 93%  LMP 09/26/2011 Physical Exam  Constitutional: She is oriented to person, place, and time. She appears well-developed and well-nourished. No distress.  Obese   HENT:  Head: Normocephalic.  Mouth/Throat: Oropharynx is clear and moist.  Eyes: Conjunctivae and EOM are normal.  Neck: Normal range of motion. No JVD present. No tracheal deviation present.  Cardiovascular: Normal rate, regular rhythm and intact distal pulses.   Radial pulse equal bilaterally  Pulmonary/Chest: Effort normal and breath sounds normal. No stridor. No respiratory distress. She  has no wheezes. She has no rales. She exhibits no tenderness.  Abdominal: Soft. She exhibits no distension and no mass. There is no tenderness. There is no rebound and no guarding.  Musculoskeletal: Normal range of motion. She exhibits no edema or tenderness.  No calf asymmetry, superficial collaterals, palpable cords, edema, Homans sign negative bilaterally.    Neurological: She is alert and oriented to person, place, and time.  Skin: Skin is warm. She is not diaphoretic.  Psychiatric: She has a normal mood and affect.  Nursing note and vitals reviewed.   ED Course  Procedures (including critical care time) Labs Review Labs Reviewed  BASIC METABOLIC PANEL - Abnormal; Notable for the following:    Potassium 3.4 (*)    Glucose, Bld 213 (*)    All other components within normal limits  CBC - Abnormal; Notable for the following:    RBC 5.18 (*)    Hemoglobin  15.4 (*)    All other components within normal limits  I-STAT TROPOININ, ED    Imaging Review Dg Chest 2 View  11/27/2015  CLINICAL DATA:  65 year old female with midsternal chest pain radiating to the left side of the neck and left arm. Emesis 5-6 times this morning. EXAM: CHEST  2 VIEW COMPARISON:  Chest x-ray 10/26/2013. FINDINGS: Lung volumes are low. No consolidative airspace disease. No pleural effusions. No pneumothorax. No pulmonary nodule or mass noted. Pulmonary vasculature and the cardiomediastinal silhouette are within normal limits. Atherosclerosis in the thoracic aorta. Left-sided pacemaker device in position with lead tips projecting over the expected location of the right atrium and right ventricular apex. Surgical clips project over the right upper quadrant of the abdomen, compatible with prior cholecystectomy. IMPRESSION: 1. Low lung volumes without radiographic evidence of acute cardiopulmonary disease. 2. Atherosclerosis. Electronically Signed   By: Vinnie Langton M.D.   On: 11/27/2015 18:31   I have personally  reviewed and evaluated these images and lab results as part of my medical decision-making.   EKG Interpretation   Date/Time:  Saturday November 27 2015 16:32:44 EST Ventricular Rate:  78 PR Interval:  192 QRS Duration: 88 QT Interval:  384 QTC Calculation: 437 R Axis:   32 Text Interpretation:  Normal sinus rhythm Low voltage QRS T wave  abnormality, consider inferior ischemia T wave abnormality, consider  anterolateral ischemia Abnormal ECG no new changes seen No acute changes  Confirmed by Kathrynn Humble, MD, ANKIT [phone]) on 11/28/2015 12:40:05 AM      MDM   Final diagnoses:  Chest pain, unspecified chest pain type    Filed Vitals:   11/27/15 1637 11/27/15 2020 11/27/15 2200 11/28/15 0000  BP:  137/82 128/76 141/74  Pulse:  77 75 73  Temp:  98.6 F (37 C)    TempSrc:  Oral    Resp:  16 12 13   Height:      Weight: 82.555 kg     SpO2:  96% 94% 93%    Medications  aspirin chewable tablet 162 mg (162 mg Oral Given 11/27/15 2251)  gi cocktail (Maalox,Lidocaine,Donnatal) (30 mLs Oral Given 11/27/15 2252)  famotidine (PEPCID) IVPB 20 mg premix (0 mg Intravenous Stopped 11/28/15 0013)  HYDROmorphone (DILAUDID) injection 0.5 mg (0.5 mg Intravenous Given 11/27/15 2336)  ondansetron (ZOFRAN) injection 4 mg (4 mg Intravenous Given 11/27/15 2337)    [name] is 65 y.o. female presenting with retrosternal pressure-like chest pain radiating to the left jaw and left arm with associated diaphoresis, nausea and 6 episodes of vomiting onset at 6 AM this morning. Patient moderate risk by heart score. Initial workup negative. Patient was recently admitted for very similar episode, had negative workup it was thought to be secondary to GERD. Case discussed with Dr. Arnoldo Morale who accepts admission.        Elmyra Ricks Yavonne Kiss, PA-C 11/28/15 Ovid, MD 11/28/15 [phone]

## 2015-11-27 NOTE — ED Notes (Addendum)
Pt c/o center chest pain onset this morning around 0600. Pain radiates to left side of neck, into jaw, and down left arm. Pain associated with N/V and diaphoresis. Pt took 4 nitro and 2 baby ASA without relief.

## 2015-11-28 DIAGNOSIS — I2510 Atherosclerotic heart disease of native coronary artery without angina pectoris: Secondary | ICD-10-CM | POA: Diagnosis not present

## 2015-11-28 DIAGNOSIS — R079 Chest pain, unspecified: Secondary | ICD-10-CM

## 2015-11-28 DIAGNOSIS — E782 Mixed hyperlipidemia: Secondary | ICD-10-CM | POA: Diagnosis not present

## 2015-11-28 DIAGNOSIS — I25119 Atherosclerotic heart disease of native coronary artery with unspecified angina pectoris: Secondary | ICD-10-CM

## 2015-11-28 DIAGNOSIS — I10 Essential (primary) hypertension: Secondary | ICD-10-CM | POA: Diagnosis not present

## 2015-11-28 DIAGNOSIS — E1169 Type 2 diabetes mellitus with other specified complication: Secondary | ICD-10-CM | POA: Diagnosis not present

## 2015-11-28 DIAGNOSIS — R0789 Other chest pain: Secondary | ICD-10-CM | POA: Diagnosis not present

## 2015-11-28 LAB — CBC
HEMATOCRIT: 42.9 % (ref 36.0–46.0)
Hemoglobin: 14.5 g/dL (ref 12.0–15.0)
MCH: 29.1 pg (ref 26.0–34.0)
MCHC: 33.8 g/dL (ref 30.0–36.0)
MCV: 86.1 fL (ref 78.0–100.0)
Platelets: 211 10*3/uL (ref 150–400)
RBC: 4.98 MIL/uL (ref 3.87–5.11)
RDW: 13.3 % (ref 11.5–15.5)
WBC: 11.6 10*3/uL — ABNORMAL HIGH (ref 4.0–10.5)

## 2015-11-28 LAB — GLUCOSE, CAPILLARY
GLUCOSE-CAPILLARY: 167 mg/dL — AB (ref 65–99)
Glucose-Capillary: 104 mg/dL — ABNORMAL HIGH (ref 65–99)
Glucose-Capillary: 219 mg/dL — ABNORMAL HIGH (ref 65–99)
Glucose-Capillary: 168 mg/dL — ABNORMAL HIGH (ref 65–99)

## 2015-11-28 LAB — BASIC METABOLIC PANEL
Anion gap: 18 — ABNORMAL HIGH (ref 5–15)
BUN: 6 mg/dL (ref 6–20)
CO2: 23 mmol/L (ref 22–32)
Calcium: 9 mg/dL (ref 8.9–10.3)
Chloride: 103 mmol/L (ref 101–111)
Creatinine, Ser: 0.74 mg/dL (ref 0.44–1.00)
GFR calc Af Amer: >60 mL/min (ref >60)
Glucose, Bld: 144 mg/dL — ABNORMAL HIGH (ref 65–99)
POTASSIUM: 3.2 mmol/L — AB (ref 3.5–5.1)
Sodium: 144 mmol/L (ref 135–145)

## 2015-11-28 LAB — TROPONIN I
Troponin I: 0.03 ng/mL (ref <0.031)
Troponin I: <0.03 ng/mL (ref <0.031)
Troponin I: 0.07 ng/mL — ABNORMAL HIGH (ref <0.031)

## 2015-11-28 MED ORDER — ASPIRIN 81 MG PO CHEW
324.0000 mg | CHEWABLE_TABLET | Freq: Every day | ORAL | Status: DC
  Administered 2015-11-28 – 2015-11-29 (×2): 324 mg via ORAL
  Filled 2015-11-28 (×2): qty 4

## 2015-11-28 MED ORDER — LISINOPRIL-HYDROCHLOROTHIAZIDE 10-12.5 MG PO TABS
1.0000 | ORAL_TABLET | Freq: Every morning | ORAL | Status: DC
Start: 2015-11-28 — End: 2015-11-28

## 2015-11-28 MED ORDER — GABAPENTIN 800 MG PO TABS
800.0000 mg | ORAL_TABLET | Freq: Three times a day (TID) | ORAL | Status: DC | PRN
  Filled 2015-11-28: qty 1

## 2015-11-28 MED ORDER — ACETAMINOPHEN 325 MG PO TABS: 650.0000 mg | ORAL_TABLET | Freq: Four times a day (QID) | ORAL | Status: DC | PRN

## 2015-11-28 MED ORDER — TIZANIDINE HCL 4 MG PO TABS
4.0000 mg | ORAL_TABLET | Freq: Four times a day (QID) | ORAL | Status: DC | PRN
  Filled 2015-11-28: qty 1

## 2015-11-28 MED ORDER — POTASSIUM CHLORIDE CRYS ER 20 MEQ PO TBCR
40.0000 meq | EXTENDED_RELEASE_TABLET | Freq: Once | ORAL | Status: AC
  Administered 2015-11-28: 40 meq via ORAL
  Filled 2015-11-28: qty 2

## 2015-11-28 MED ORDER — ACETAMINOPHEN 650 MG RE SUPP: 650.0000 mg | Freq: Four times a day (QID) | RECTAL | Status: DC | PRN

## 2015-11-28 MED ORDER — SUCRALFATE 1 GM/10ML PO SUSP
1.0000 g | Freq: Two times a day (BID) | ORAL | Status: DC
  Administered 2015-11-28 – 2015-11-29 (×3): 1 g via ORAL
  Filled 2015-11-28 (×3): qty 10

## 2015-11-28 MED ORDER — ONDANSETRON HCL 4 MG PO TABS
4.0000 mg | ORAL_TABLET | Freq: Four times a day (QID) | ORAL | Status: DC | PRN
  Filled 2015-11-28: qty 1

## 2015-11-28 MED ORDER — ROSUVASTATIN CALCIUM 10 MG PO TABS
20.0000 mg | ORAL_TABLET | Freq: Every day | ORAL | Status: DC
  Administered 2015-11-28: 20 mg via ORAL
  Filled 2015-11-28: qty 2

## 2015-11-28 MED ORDER — ALPRAZOLAM 0.5 MG PO TABS: 0.5000 mg | ORAL_TABLET | Freq: Two times a day (BID) | ORAL | Status: DC | PRN

## 2015-11-28 MED ORDER — ONDANSETRON HCL 4 MG/2ML IJ SOLN
4.0000 mg | Freq: Four times a day (QID) | INTRAMUSCULAR | Status: DC | PRN
  Administered 2015-11-28 – 2015-11-29 (×5): 4 mg via INTRAVENOUS
  Filled 2015-11-28 (×5): qty 2

## 2015-11-28 MED ORDER — SODIUM CHLORIDE 0.9 % IV SOLN: 250.0000 mL | INTRAVENOUS | Status: DC | PRN

## 2015-11-28 MED ORDER — SODIUM CHLORIDE 0.9% FLUSH: 3.0000 mL | INTRAVENOUS | Status: DC | PRN

## 2015-11-28 MED ORDER — SODIUM CHLORIDE 0.9% FLUSH
3.0000 mL | Freq: Two times a day (BID) | INTRAVENOUS | Status: DC
  Administered 2015-11-29: 3 mL via INTRAVENOUS

## 2015-11-28 MED ORDER — SODIUM CHLORIDE 0.9% FLUSH
3.0000 mL | Freq: Two times a day (BID) | INTRAVENOUS | Status: DC
  Administered 2015-11-28: 3 mL via INTRAVENOUS

## 2015-11-28 MED ORDER — HYDROMORPHONE HCL 1 MG/ML IJ SOLN
0.5000 mg | INTRAMUSCULAR | Status: DC | PRN
  Administered 2015-11-28 – 2015-11-29 (×6): 1 mg via INTRAVENOUS
  Filled 2015-11-28 (×6): qty 1

## 2015-11-28 MED ORDER — GI COCKTAIL ~~LOC~~: 30.0000 mL | Freq: Three times a day (TID) | ORAL | Status: DC | PRN

## 2015-11-28 MED ORDER — INSULIN GLARGINE 100 UNIT/ML ~~LOC~~ SOLN
42.0000 [IU] | Freq: Every morning | SUBCUTANEOUS | Status: DC
  Administered 2015-11-28 – 2015-11-29 (×2): 42 [IU] via SUBCUTANEOUS
  Filled 2015-11-28 (×2): qty 0.42

## 2015-11-28 MED ORDER — HYDROCHLOROTHIAZIDE 12.5 MG PO CAPS
12.5000 mg | ORAL_CAPSULE | Freq: Every day | ORAL | Status: DC
Start: 2015-11-28 — End: 2015-11-29
  Administered 2015-11-28 – 2015-11-29 (×2): 12.5 mg via ORAL
  Filled 2015-11-28 (×2): qty 1

## 2015-11-28 MED ORDER — METOPROLOL TARTRATE 25 MG PO TABS
25.0000 mg | ORAL_TABLET | Freq: Every day | ORAL | Status: DC
  Administered 2015-11-28: 25 mg via ORAL
  Filled 2015-11-28: qty 1

## 2015-11-28 MED ORDER — ALUM & MAG HYDROXIDE-SIMETH 200-200-20 MG/5ML PO SUSP: 30.0000 mL | Freq: Four times a day (QID) | ORAL | Status: DC | PRN

## 2015-11-28 MED ORDER — LISINOPRIL 10 MG PO TABS
10.0000 mg | ORAL_TABLET | Freq: Every day | ORAL | Status: DC
  Administered 2015-11-28 – 2015-11-29 (×2): 10 mg via ORAL
  Filled 2015-11-28 (×2): qty 1

## 2015-11-28 MED ORDER — NITROGLYCERIN 2 % TD OINT
0.5000 [in_us] | TOPICAL_OINTMENT | Freq: Four times a day (QID) | TRANSDERMAL | Status: DC
  Administered 2015-11-28 – 2015-11-29 (×4): 0.5 [in_us] via TOPICAL
  Filled 2015-11-28: qty 1
  Filled 2015-11-28: qty 0.5

## 2015-11-28 MED ORDER — GABAPENTIN 400 MG PO CAPS: 800.0000 mg | ORAL_CAPSULE | Freq: Three times a day (TID) | ORAL | Status: DC | PRN

## 2015-11-28 MED ORDER — INSULIN ASPART 100 UNIT/ML ~~LOC~~ SOLN
0.0000 [IU] | Freq: Three times a day (TID) | SUBCUTANEOUS | Status: DC
  Administered 2015-11-28: 3 [IU] via SUBCUTANEOUS
  Administered 2015-11-29 (×2): 2 [IU] via SUBCUTANEOUS

## 2015-11-28 MED ORDER — PANTOPRAZOLE SODIUM 40 MG PO TBEC
40.0000 mg | DELAYED_RELEASE_TABLET | Freq: Two times a day (BID) | ORAL | Status: DC
  Administered 2015-11-28 – 2015-11-29 (×3): 40 mg via ORAL
  Filled 2015-11-28 (×3): qty 1

## 2015-11-28 NOTE — Consult Note (Signed)
Admit date: 11/27/2015 Referring Physician  Dr. Sloan Leiter Primary Physician  Dr. London Pepper Primary Cardiologist  None Reason for Consultation  Chest pain  HPI: This is a 65 y.o. female with a histoy of Non-Obstructive CAD on Catherization 03/2012,Hx of SSS S/P Pacer, HTN, DM2, Hyperlipidemia, and Fibromyalgia who presents to the ED with complaints of substernal chest pain radiating in to her neck and left arm associated with SOB, nausea and Vomiting and Diaphoresis. She reports that she got up yesterday am around 6am and started feeling nauseated and vomited 3 times.  Then broke out in a sweat and had severe chest pressure that radiated into her left neck and left arm.  She took a total of 4 SL NTG without any effect.  She continued to have severe nausea and went to the ER.  She denies any fever, chills, abdominal pain or diarrhea.She was given a GI cocktail that did not help and made her nausea worse.  She was evaluated in the ED and found to have a negative initial cardiac workup and was admitted by the [facility] service.  Cardiology is now asked to consult for further w/u of CP.  Cardiac enzymes have been negative x 3.  EKG shows atrial paced rhythm with diffuse T wave inversion.  She says that she had more CP after admission with some nausea.  She says that when she eats it feels like it is going to come back up in her throat.     PMH:   Past Medical History  Diagnosis Date  . Hypertension   . High cholesterol   . Pacemaker   . Diabetes mellitus   . Arthritis   . Headache(784.0)   . PONV (postoperative nausea and vomiting)   . Coronary artery disease   . Anxiety   . Fibromyalgia   . Dysrhythmia   . 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 Abdominal hysterectomy    . Joint replacement      right knee  . Cholecystectomy      2005-2006  . Insert / replace / remove pacemaker      2008, [employer], Pacemaker  . Fracture surgery        left ankle  . Left heart catheterization with coronary angiogram N/A 03/19/2012    Procedure: LEFT HEART CATHETERIZATION WITH CORONARY ANGIOGRAM;  Surgeon: Peter M Martinique, MD;  Location: [facility] CATH LAB;  Service: Cardiovascular;  Laterality: N/A;    Allergies:  Iodinated diagnostic agents; Shellfish allergy; Shellfish-derived products; Carafate; Cephalexin; Cephalexin; Cholestyramine; Cyclobenzaprine; Decadron; Dexamethasone sodium phosphate; Erythromycin; Erythromycin base; Heparin; Iohexol; Ketorolac; Ketorolac tromethamine; Ketorolac tromethamine; Levofloxacin; Methocarbamol; Methocarbamol; Morphine and related; Other; Oxycodone; Penicillins; Prednisone; Questran; Reglan; and Tape Prior to Admit Meds:   Prescriptions prior to admission  Medication Sig Dispense Refill Last Dose  . ALPRAZolam (XANAX) 1 MG tablet Take 0.5 mg by mouth 2 (two) times daily as needed for anxiety or sleep.    11/26/2015 at Unknown time  . aspirin 81 MG tablet Take 81 mg by mouth daily.     11/27/2015 at Unknown time  . gabapentin (NEURONTIN) 800 MG tablet Take 800 mg by mouth 3 (three) times daily as needed (pain).   11/27/2015 at Unknown time  . insulin aspart (NOVOLOG) 100 UNIT/ML injection Inject 1-10 Units into the skin 3 (three) times daily before meals. Pt takes anywhere from 1-10 units based on sliding scale.  11/26/2015 at Unknown time  . insulin glargine (LANTUS) 100 UNIT/ML injection Inject 42 Units into the skin every morning.    11/27/2015 at Unknown time  . lisinopril-hydrochlorothiazide (PRINZIDE,ZESTORETIC) 10-12.5 MG per tablet Take 1 tablet by mouth every morning.    11/27/2015 at Unknown time  . metFORMIN (GLUCOPHAGE) 850 MG tablet Take 850 mg by mouth 2 (two) times daily with a meal.    11/27/2015 at am  . metoprolol tartrate (LOPRESSOR) 25 MG tablet Take 25 mg by mouth at bedtime.   11/26/2015 at 2200  . nitroGLYCERIN (NITROSTAT) 0.4 MG SL tablet Place 0.4 mg under the tongue every 5 (five) minutes as needed.  For chest pain.    11/27/2015 at Unknown time  . ondansetron (ZOFRAN ODT) 8 MG disintegrating tablet Take 1 tablet (8 mg total) by mouth every 8 (eight) hours as needed for nausea. 20 tablet 0 11/27/2015 at Unknown time  . oxyCODONE-acetaminophen (PERCOCET) 10-325 MG per tablet Take 1 tablet by mouth 3 (three) times daily as needed for pain.   11/27/2015 at Unknown time  . pantoprazole (PROTONIX) 40 MG tablet Take 1 tablet (40 mg total) by mouth 2 (two) times daily before a meal. 60 tablet 1 11/27/2015 at Unknown time  . rosuvastatin (CRESTOR) 20 MG tablet Take 20 mg by mouth at bedtime.   11/26/2015 at Unknown time  . tiZANidine (ZANAFLEX) 4 MG tablet Take 4 mg by mouth every 6 (six) hours as needed for muscle spasms.   11/26/2015 at Unknown time  . famotidine (PEPCID) 20 MG tablet Take 1 tablet (20 mg total) by mouth at bedtime. 30 tablet 1    Fam HX:    Family History  Problem Relation Age of Onset  . Coronary artery disease Mother     s/p stenting, currently 33  . Cancer Father     heavy smoker, died of lung cancer in 110  . Brain cancer Brother     ? CAD   Social HX:    Social History   Social History  . Marital Status: Widowed    Spouse Name: N/A  . Number of Children: N/A  . Years of Education: N/A   Occupational History  . Not on file.   Social History Main Topics  . Smoking status: Never Smoker   . Smokeless tobacco: Never Used  . Alcohol Use: No  . Drug Use: No  . Sexual Activity: Not Currently    Birth Control/ Protection: None   Other Topics Concern  . Not on file   Social History Narrative     ROS:  All 11 ROS were addressed and are negative except what is stated in the HPI  Physical Exam: Blood pressure 112/63, pulse 72, temperature 98 F (36.7 C), temperature source Oral, resp. rate 16, height 5\' 5"  (1.651 m), weight 182 lb 11.2 oz (82.872 kg), last menstrual period 09/26/2011, SpO2 93 %.    General: Well developed, well nourished, in no acute  distress Head: Eyes PERRLA, No xanthomas.   Normal cephalic and atramatic  Lungs:   Clear bilaterally to auscultation and percussion. Heart:   HRRR S1 S2 Pulses are 2+ & equal.            No carotid bruit. No JVD.  No abdominal bruits. No femoral bruits. Abdomen: Bowel sounds are positive, abdomen soft and non-tender without masses  Extremities:   No clubbing, cyanosis or edema.  DP +1 Neuro: Alert and oriented X 3. Psych:  Good affect, responds  appropriately    Labs:   Lab Results  Component Value Date   WBC 11.6* 11/28/2015   HGB 14.5 11/28/2015   HCT 42.9 11/28/2015   MCV 86.1 11/28/2015   PLT 211 11/28/2015    Recent Labs Lab 11/28/15 0358  NA 144  K 3.2*  CL 103  CO2 23  BUN 6  CREATININE 0.74  CALCIUM 9.0  GLUCOSE 144*   No results found for: PTT Lab Results  Component Value Date   INR 1.03 03/19/2012   INR 0.97 11/07/2011   INR 1.01 08/21/2011   Lab Results  Component Value Date   CKTOTAL 44 03/18/2012   CKMB 1.6 03/18/2012   TROPONINI <0.03 11/28/2015     Lab Results  Component Value Date   CHOL 218* 08/21/2011   CHOL * 11/09/2008    272        ATP III CLASSIFICATION:  <200     mg/dL   Desirable  200-239  mg/dL   Borderline High  >=240    mg/dL   High          CHOL * 05/26/2008    247        ATP III CLASSIFICATION:  <200     mg/dL   Desirable  200-239  mg/dL   Borderline High  >=240    mg/dL   High   Lab Results  Component Value Date   HDL 34* 08/21/2011   HDL 31* 11/09/2008   HDL 34* 05/26/2008   Lab Results  Component Value Date   LDLCALC 138* 08/21/2011   LDLCALC * 11/09/2008    195        Total Cholesterol/HDL:CHD Risk Coronary Heart Disease Risk Table                     Men   Women  1/2 Average Risk   3.4   3.3  Average Risk       5.0   4.4  2 X Average Risk   9.6   7.1  3 X Average Risk  23.4   11.0        Use the calculated Patient Ratio above and the CHD Risk Table to determine the patient's CHD Risk.        ATP  III CLASSIFICATION (LDL):  <100     mg/dL   Optimal  100-129  mg/dL   Near or Above                    Optimal  130-159  mg/dL   Borderline  160-189  mg/dL   High  >190     mg/dL   Very High   LDLCALC * 05/26/2008    182        Total Cholesterol/HDL:CHD Risk Coronary Heart Disease Risk Table                     Men   Women  1/2 Average Risk   3.4   3.3   Lab Results  Component Value Date   TRIG 232* 08/21/2011   TRIG 230* 11/09/2008   TRIG 155* 05/26/2008   Lab Results  Component Value Date   CHOLHDL 6.4 08/21/2011   CHOLHDL 8.8 11/09/2008   CHOLHDL 7.3 05/26/2008   No results found for: LDLDIRECT    Radiology:  Dg Chest 2 View  11/27/2015  CLINICAL DATA:  65 year old female with midsternal chest pain radiating to the left side  of the neck and left arm. Emesis 5-6 times this morning. EXAM: CHEST  2 VIEW COMPARISON:  Chest x-ray 10/26/2013. FINDINGS: Lung volumes are low. No consolidative airspace disease. No pleural effusions. No pneumothorax. No pulmonary nodule or mass noted. Pulmonary vasculature and the cardiomediastinal silhouette are within normal limits. Atherosclerosis in the thoracic aorta. Left-sided pacemaker device in position with lead tips projecting over the expected location of the right atrium and right ventricular apex. Surgical clips project over the right upper quadrant of the abdomen, compatible with prior cholecystectomy. IMPRESSION: 1. Low lung volumes without radiographic evidence of acute cardiopulmonary disease. 2. Atherosclerosis. Electronically Signed   By: Vinnie Langton M.D.   On: 11/27/2015 18:31    EKG:  Atrial paced with diffuse T wave inversions - no change from prior EKG 10/2013  ASSESSMENT/PLAN:   1.  Chest pain with typical and atypical symptoms.  It does radiate into her arms and neck and is associated with SOB severe diaphoresis and nausea but seems to be worse when she eats, feeling like her food wants to come back up in her mouth.  Cath in  2013 with nonobstructive ASCAD.  EKG shows atrial paced rhythm with diffuse T wave inversions in the inferior and anterior leads that is unchanged from EKG 10/2013.  She has a history of fibromyalgia and also DM so this could be diabetic gastroparesis, GERD or esophageal spasm.  Troponin neg x 3.  Recommend NPO after MN for nuclear stress test in the am to rule out progression of CAD with ischemia.  Continue ASA/BB and statin.  Heparin gtt if enzymes become positive.   2.  Nonobstructive ASCAD by cath 2013.  Continue ASA/BB/statin. 3.  HTN - controlled on BB/ACE I/diuretic 4.  Dyslipidemia - continue statin.  Check FLP 5.  SSS s/p PPM 6.  DM - per TRH 7.  Hypokalemia - replete  Sueanne Margarita, MD  11/28/2015  1:19 PM

## 2015-11-28 NOTE — Progress Notes (Signed)
PATIENT DETAILS Name: [name] Age: 65 y.o. Sex: female Date of Birth: [birth year] Admit Date: 11/27/2015 Admitting Physician Theressa Millard, MD [id number], Rosalyn Charters, MD  Subjective: No chest pain this morning.  Assessment/Plan: Principal Problem: Chest pain: No chest pain this morning, but admitted with chest pain with very typical features (pressure-like, radiating to neck, left shoulder and down left arm). Troponins negative, cardiology consulted. Continue aspirin, statin, metoprolol.  Active Problems: GERD: Continue PPI, add Carafate. Follow.  Hypertension: Controlled, continue metoprolol, lisinopril and HCTZ. Follow.  Hyperlipemia, mixed: Continue statin  Type 2 diabetes: Hold metformin, continue 14 units of Lantus, and SSI-follow.  Status post pacemaker placement  Disposition: Remain inpatient  Antimicrobial agents  See below  Anti-infectives    None      DVT Prophylaxis: SCD's  Code Status: Full code or DNR  Family Communication None at bedside  Procedures: None  CONSULTS:  cardiology  Time spent 30 minutes-Greater than 50% of this time was spent in counseling, explanation of diagnosis, planning of further management, and coordination of care.  MEDICATIONS: Scheduled Meds: . lisinopril  10 mg Oral Daily   And  . hydrochlorothiazide  12.5 mg Oral Daily  . insulin glargine  42 Units Subcutaneous q morning - 10a  . metoprolol tartrate  25 mg Oral QHS  . nitroGLYCERIN  0.5 inch Topical 4 times per day  . pantoprazole  40 mg Oral BID AC  . rosuvastatin  20 mg Oral QHS  . sodium chloride flush  3 mL Intravenous Q12H  . sodium chloride flush  3 mL Intravenous Q12H   Continuous Infusions:  PRN Meds:.sodium chloride, acetaminophen **OR** acetaminophen, ALPRAZolam, alum & mag hydroxide-simeth, gabapentin, HYDROmorphone (DILAUDID) injection, ondansetron **OR** ondansetron (ZOFRAN) IV, sodium chloride flush,  tiZANidine    PHYSICAL EXAM: Vital signs in last 24 hours: Filed Vitals:   11/27/15 2200 11/28/15 0000 11/28/15 0337 11/28/15 0618  BP: 128/76 141/74 139/87 112/63  Pulse: 75 73 90 72  Temp:   98.2 F (36.8 C) 98 F (36.7 C)  TempSrc:   Oral Oral  Resp: 12 13 15 16   Height:      Weight:   82.872 kg (182 lb 11.2 oz)   SpO2: 94% 93% 97% 93%    Weight change:  Filed Weights   11/27/15 1637 11/28/15 0337  Weight: 82.555 kg (182 lb) 82.872 kg (182 lb 11.2 oz)   Body mass index is 30.4 kg/(m^2).   Gen Exam: Awake and alert with clear speech.   Neck: Supple, No JVD.   Chest: B/L Clear.   CVS: S1 S2 Regular, no murmurs.  Abdomen: soft, BS +, non tender, non distended.  Extremities: no edema, lower extremities warm to touch Neurologic: Non Focal.   Skin: No Rash.   Wounds: N/A.   Intake/Output from previous day:  Intake/Output Summary (Last 24 hours) at 11/28/15 1140 Last data filed at 11/28/15 0338  Gross per 24 hour  Intake      0 ml  Output      1 ml  Net     -1 ml     LAB RESULTS: CBC  Recent Labs Lab 11/27/15 1655 11/28/15 0358  WBC 7.9 11.6*  HGB 15.4* 14.5  HCT 44.1 42.9  PLT 191 211  MCV 85.1 86.1  MCH 29.7 29.1  MCHC 34.9 33.8  RDW 13.1 13.3    Chemistries   Recent Labs  Lab 11/27/15 1655 11/28/15 0358  NA 138 144  K 3.4* 3.2*  CL 101 103  CO2 22 23  GLUCOSE 213* 144*  BUN 6 6  CREATININE 0.71 0.74  CALCIUM 8.9 9.0    CBG:  Recent Labs Lab 11/28/15 0616  GLUCAP 104*    GFR Estimated Creatinine Clearance: 74.6 mL/min (by C-G formula based on Cr of 0.74).  Coagulation profile No results for input(s): INR, PROTIME in the last 168 hours.  Cardiac Enzymes  Recent Labs Lab 11/28/15 0358  TROPONINI 0.03    Invalid input(s): POCBNP No results for input(s): DDIMER in the last 72 hours. No results for input(s): HGBA1C in the last 72 hours. No results for input(s): CHOL, HDL, LDLCALC, TRIG, CHOLHDL, LDLDIRECT in the last 72  hours. No results for input(s): TSH, T4TOTAL, T3FREE, THYROIDAB in the last 72 hours.  Invalid input(s): FREET3 No results for input(s): VITAMINB12, FOLATE, FERRITIN, TIBC, IRON, RETICCTPCT in the last 72 hours. No results for input(s): LIPASE, AMYLASE in the last 72 hours.  Urine Studies No results for input(s): UHGB, CRYS in the last 72 hours.  Invalid input(s): UACOL, UAPR, USPG, UPH, UTP, UGL, UKET, UBIL, UNIT, UROB, ULEU, UEPI, UWBC, URBC, UBAC, CAST, UCOM, BILUA  MICROBIOLOGY: No results found for this or any previous visit (from the past 240 hour(s)).  RADIOLOGY STUDIES/RESULTS: Dg Chest 2 View  11/27/2015  CLINICAL DATA:  65 year old female with midsternal chest pain radiating to the left side of the neck and left arm. Emesis 5-6 times this morning. EXAM: CHEST  2 VIEW COMPARISON:  Chest x-ray 10/26/2013. FINDINGS: Lung volumes are low. No consolidative airspace disease. No pleural effusions. No pneumothorax. No pulmonary nodule or mass noted. Pulmonary vasculature and the cardiomediastinal silhouette are within normal limits. Atherosclerosis in the thoracic aorta. Left-sided pacemaker device in position with lead tips projecting over the expected location of the right atrium and right ventricular apex. Surgical clips project over the right upper quadrant of the abdomen, compatible with prior cholecystectomy. IMPRESSION: 1. Low lung volumes without radiographic evidence of acute cardiopulmonary disease. 2. Atherosclerosis. Electronically Signed   By: Vinnie Langton M.D.   On: 11/27/2015 18:31    Oren Binet, MD  [facility] Pager:[phone]  If 7PM-7AM, please contact night-coverage [email or web address] Password [email or web address] 11/28/2015, 11:40 AM

## 2015-11-28 NOTE — H&P (Addendum)
[facility] Admission History and Physical       JENESSY KUNZLER [id number] DOB: [birth year] DOA: 11/27/2015  Referring physician: EDP PCP: Bonnita Nasuti, MD  Specialists:   Chief Complaint: Chest Pain  HPI: REDONNA SCHATZ is a 65 y.o. female with a histoy of Non-Obstructive CAD on Catherization 03/2012,Hx of SSS S/P Pacer,  HTN, DM2, Hyperlipidemia, and Fibromyalgia who presents to the ED with complaints of substernal chest pain radiating in to her neck and left arm associated with SOB, nausea and  Vomiting and Diaphoresis.  She reports having pain since 6:30 pm     Her pain was described as sharp, and rated at a 8/10.   She was evaluated in the ED and found to have a negative initial cardiac workup and has been referred for further evaluation.      Review of Systems:    Constitutional: No Weight Loss, No Weight Gain, Night Sweats, Fevers, Chills, Dizziness, Light Headedness, Fatigue, or Generalized Weakness HEENT: No Headaches, Difficulty Swallowing,Tooth/Dental Problems,Sore Throat,  No Sneezing, Rhinitis, Ear Ache, Nasal Congestion, or Post Nasal Drip,  Cardio-vascular:  +Chest pain, Orthopnea, PND, Edema in Lower Extremities, Anasarca, Dizziness, Palpitations  Resp: +Dyspnea, No DOE, No Productive Cough, No Non-Productive Cough, No Hemoptysis, No Wheezing.    GI: No Heartburn, Indigestion, Abdominal Pain, +Nausea, +Vomiting, Diarrhea, Constipation, Hematemesis, Hematochezia, Melena, Change in Bowel Habits,  Loss of Appetite  GU: No Dysuria, No Change in Color of Urine, No Urgency or Urinary Frequency, No Flank pain.  Musculoskeletal: No Joint Pain or Swelling, No Decreased Range of Motion, No Back Pain.  Neurologic: No Syncope, No Seizures, Muscle Weakness, Paresthesia, Vision Disturbance or Loss, No Diplopia, No Vertigo, No Difficulty Walking,  Skin: No Rash or Lesions. Psych: No Change in Mood or Affect, No Depression or Anxiety, No Memory loss, No Confusion, or  Hallucinations   Past Medical History  Diagnosis Date  . Hypertension   . High cholesterol   . Pacemaker   . Diabetes mellitus   . Arthritis   . Headache(784.0)   . PONV (postoperative nausea and vomiting)   . Coronary artery disease   . Anxiety   . Fibromyalgia   . Dysrhythmia   . 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 Abdominal hysterectomy    . Joint replacement      right knee  . Cholecystectomy      2005-2006  . Insert / replace / remove pacemaker      2008, [employer], Pacemaker  . Fracture surgery      left ankle  . Left heart catheterization with coronary angiogram N/A 03/19/2012    Procedure: LEFT HEART CATHETERIZATION WITH CORONARY ANGIOGRAM;  Surgeon: Peter M Martinique, MD;  [facility];  Service: Cardiovascular;  Laterality: N/A;      Prior to Admission medications   Medication Sig Start Date End Date Taking? Authorizing Provider  ALPRAZolam Duanne Moron) 1 MG tablet Take 0.5 mg by mouth 2 (two) times daily as needed for anxiety or sleep.    Yes Historical Provider, MD  aspirin 81 MG tablet Take 81 mg by mouth daily.     Yes Historical Provider, MD  gabapentin (NEURONTIN) 800 MG tablet Take 800 mg by mouth 3 (three) times daily as needed (pain).   Yes Historical Provider, MD  insulin aspart (NOVOLOG) 100 UNIT/ML injection Inject 1-10 Units into the skin 3 (three) times daily  before meals. Pt takes anywhere from 1-10 units based on sliding scale.   Yes Historical Provider, MD  insulin glargine (LANTUS) 100 UNIT/ML injection Inject 42 Units into the skin every morning.    Yes Historical Provider, MD  lisinopril-hydrochlorothiazide (PRINZIDE,ZESTORETIC) 10-12.5 MG per tablet Take 1 tablet by mouth every morning.    Yes Historical Provider, MD  metFORMIN (GLUCOPHAGE) 850 MG tablet Take 850 mg by mouth 2 (two) times daily with a meal.    Yes Historical Provider, MD  metoprolol tartrate (LOPRESSOR) 25 MG tablet Take  25 mg by mouth at bedtime.   Yes Historical Provider, MD  nitroGLYCERIN (NITROSTAT) 0.4 MG SL tablet Place 0.4 mg under the tongue every 5 (five) minutes as needed. For chest pain.  07/26/11 11/27/15 Yes Shanker Kristeen Mans, MD  ondansetron (ZOFRAN ODT) 8 MG disintegrating tablet Take 1 tablet (8 mg total) by mouth every 8 (eight) hours as needed for nausea. 06/16/12  Yes Heather Laisure, PA-C  oxyCODONE-acetaminophen (PERCOCET) 10-325 MG per tablet Take 1 tablet by mouth 3 (three) times daily as needed for pain.   Yes Historical Provider, MD  pantoprazole (PROTONIX) 40 MG tablet Take 1 tablet (40 mg total) by mouth 2 (two) times daily before a meal. 10/27/13  Yes Barton Dubois, MD  rosuvastatin (CRESTOR) 20 MG tablet Take 20 mg by mouth at bedtime.   Yes Historical Provider, MD  tiZANidine (ZANAFLEX) 4 MG tablet Take 4 mg by mouth every 6 (six) hours as needed for muscle spasms.   Yes Historical Provider, MD  famotidine (PEPCID) 20 MG tablet Take 1 tablet (20 mg total) by mouth at bedtime. 10/27/13   Barton Dubois, MD     Allergies  Allergen Reactions  . Iodinated Diagnostic Agents Anaphylaxis  . Shellfish Allergy Anaphylaxis  . Shellfish-Derived Products Anaphylaxis    NO SEAFOOD WHATSOEVER!!  . Carafate [Sucralfate] Nausea And Vomiting  . Cephalexin Nausea And Vomiting  . Cephalexin Other (See Comments)    Gi upset  . Cholestyramine Nausea And Vomiting  . Cyclobenzaprine   . Decadron [Dexamethasone] Nausea And Vomiting  . Dexamethasone Sodium Phosphate Other (See Comments)    Gi upset  . Erythromycin Itching  . Erythromycin Base Hives  . Heparin     Nosebleed  . Iohexol      Desc: pt states reaction is anaphalactic shock from IV dye and any kind of shellfish. pt states she almost died the last time she had a reaction.   Marland Kitchen Ketorolac Other (See Comments)  . Ketorolac Tromethamine Other (See Comments)    unknown  . Ketorolac Tromethamine   . Levofloxacin Hives  . Methocarbamol Itching  and Nausea And Vomiting  . Methocarbamol Nausea And Vomiting  . Morphine And Related   . Other     Iv dye  . Oxycodone Nausea And Vomiting    I.R. (ONLY)  . Penicillins Itching and Other (See Comments)    Gi upset  . Prednisone Itching and Nausea And Vomiting    Jitteriness & severe itching  . Questran Nausea And Vomiting  . Reglan [Metoclopramide] Nausea And Vomiting  . Tape Rash    PLEASE USE PAPER TAPE!!    Social History:  reports that she has never smoked. She has never used smokeless tobacco. She reports that she does not drink alcohol or use illicit drugs.     Family History  Problem Relation Age of Onset  . Coronary artery disease Mother     s/p stenting, currently 66  .  Cancer Father     heavy smoker, died of lung cancer in 62  . Brain cancer Brother     ? CAD       Physical Exam:  GEN:  Pleasant Obese 65 y.o. Caucasian female examined and in no acute distress; cooperative with exam Filed Vitals:   11/27/15 1637 11/27/15 2020 11/27/15 2200 11/28/15 0000  BP:  137/82 128/76 141/74  Pulse:  77 75 73  Temp:  98.6 F (37 C)    TempSrc:  Oral    Resp:  16 12 13   Height:      Weight: 82.555 kg (182 lb)     SpO2:  96% 94% 93%   Blood pressure 141/74, pulse 73, temperature 98.6 F (37 C), temperature source Oral, resp. rate 13, height 5\' 5"  (1.651 m), weight 82.555 kg (182 lb), last menstrual period 09/26/2011, SpO2 93 %. PSYCH: She is alert and oriented x4; does not appear anxious does not appear depressed; affect is normal HEENT: Normocephalic and Atraumatic, Mucous membranes pink; PERRLA; EOM intact; Fundi:  Benign;  No scleral icterus, Nares: Patent, Oropharynx: Clear, Poor Sparse Dentition,    Neck:  FROM, No Cervical Lymphadenopathy nor Thyromegaly or Carotid Bruit; No JVD; Breasts:: Not examined CHEST WALL: No tenderness CHEST: Normal respiration, clear to auscultation bilaterally HEART: Regular rate and rhythm; no murmurs rubs or gallops BACK: No  kyphosis or scoliosis; No CVA tenderness ABDOMEN: Positive Bowel Sounds, Obese, Soft Non-Tender, No Rebound or Guarding; No Masses, No Organomegaly, No Pannus; No Intertriginous candida. Rectal Exam: Not done EXTREMITIES: No Cyanosis, Clubbing, or Edema; No Ulcerations. Genitalia: not examined PULSES: 2+ and symmetric SKIN: Normal hydration no rash or ulceration CNS:  Alert and Oriented x 4, No Focal Deficits Vascular: pulses palpable throughout    Labs on Admission:  Basic Metabolic Panel:  Recent Labs Lab 11/27/15 1655  NA 138  K 3.4*  CL 101  CO2 22  GLUCOSE 213*  BUN 6  CREATININE 0.71  CALCIUM 8.9   Liver Function Tests: No results for input(s): AST, ALT, ALKPHOS, BILITOT, PROT, ALBUMIN in the last 168 hours. No results for input(s): LIPASE, AMYLASE in the last 168 hours. No results for input(s): AMMONIA in the last 168 hours. CBC:  Recent Labs Lab 11/27/15 1655  WBC 7.9  HGB 15.4*  HCT 44.1  MCV 85.1  PLT 191   Cardiac Enzymes: No results for input(s): CKTOTAL, CKMB, CKMBINDEX, TROPONINI in the last 168 hours.  BNP (last 3 results) No results for input(s): BNP in the last 8760 hours.  ProBNP (last 3 results) No results for input(s): PROBNP in the last 8760 hours.  CBG: No results for input(s): GLUCAP in the last 168 hours.  Radiological Exams on Admission: Dg Chest 2 View  11/27/2015  CLINICAL DATA:  65 year old female with midsternal chest pain radiating to the left side of the neck and left arm. Emesis 5-6 times this morning. EXAM: CHEST  2 VIEW COMPARISON:  Chest x-ray 10/26/2013. FINDINGS: Lung volumes are low. No consolidative airspace disease. No pleural effusions. No pneumothorax. No pulmonary nodule or mass noted. Pulmonary vasculature and the cardiomediastinal silhouette are within normal limits. Atherosclerosis in the thoracic aorta. Left-sided pacemaker device in position with lead tips projecting over the expected location of the right atrium  and right ventricular apex. Surgical clips project over the right upper quadrant of the abdomen, compatible with prior cholecystectomy. IMPRESSION: 1. Low lung volumes without radiographic evidence of acute cardiopulmonary disease. 2. Atherosclerosis. Electronically Signed  By: Vinnie Langton M.D.   On: 11/27/2015 18:31     EKG: Independently reviewed. Normal Sinus Rhythm Rate = 78 + Anterolateral Ischemic Changes Present     Assessment/Plan:      65 y.o. female with  Principal Problem:    Chest pain    Cardiac Monitoring    Cycle Troponins    Nitropaste, O2 ASA    2D ECHO in AM    Check Fasting Lipids in AM       Active Problems:      Coronary artery disease    Continue Metoprolol, Lisinopril/HCTZ, and Crestor Rx           Hypertension, benign     Continue Metoprolol , and Lisinopril/HCTz Rx    Monitor BPs      Hyperlipemia, mixed    Continue Crestor Rx      DM (diabetes mellitus), type 2, uncontrolled (HCC)    Continue Lantus Insulin Rx    SSI coverage PRN    Check HbA1c in AM      DVT Prophylaxis    Lovenox      Code Status:     FULL CODE      Family Communication:   No Family Present    Disposition Plan:  Observation Status        Time spent: 58 Minutes      Theressa Millard [facility] Pager [phone]   If 7AM -7PM Please Contact the Day Rounding Team MD for [facility]  If 7PM-7AM, Please Contact Night-Floor Coverage  [email or web address] Password [email or web address] 11/28/2015, 12:38 AM     ADDENDUM:   Patient was seen and examined on 11/28/2015

## 2015-11-29 ENCOUNTER — Observation Stay (HOSPITAL_COMMUNITY): Payer: Medicare Other

## 2015-11-29 ENCOUNTER — Observation Stay (HOSPITAL_BASED_OUTPATIENT_CLINIC_OR_DEPARTMENT_OTHER): Payer: Medicare Other

## 2015-11-29 DIAGNOSIS — I25118 Atherosclerotic heart disease of native coronary artery with other forms of angina pectoris: Secondary | ICD-10-CM

## 2015-11-29 DIAGNOSIS — R072 Precordial pain: Secondary | ICD-10-CM | POA: Diagnosis not present

## 2015-11-29 DIAGNOSIS — E782 Mixed hyperlipidemia: Secondary | ICD-10-CM | POA: Diagnosis not present

## 2015-11-29 DIAGNOSIS — Z794 Long term (current) use of insulin: Secondary | ICD-10-CM

## 2015-11-29 DIAGNOSIS — I10 Essential (primary) hypertension: Secondary | ICD-10-CM | POA: Diagnosis not present

## 2015-11-29 DIAGNOSIS — E1169 Type 2 diabetes mellitus with other specified complication: Secondary | ICD-10-CM | POA: Diagnosis not present

## 2015-11-29 DIAGNOSIS — E1165 Type 2 diabetes mellitus with hyperglycemia: Secondary | ICD-10-CM

## 2015-11-29 DIAGNOSIS — R079 Chest pain, unspecified: Secondary | ICD-10-CM | POA: Diagnosis not present

## 2015-11-29 LAB — NM MYOCAR MULTI W/SPECT W/WALL MOTION / EF
CHL CUP RESTING HR STRESS: 79 {beats}/min
CSEPEW: 1 METS
Exercise duration (min): 6 min
Exercise duration (sec): 19 s
Peak HR: 111 {beats}/min

## 2015-11-29 LAB — ECHOCARDIOGRAM COMPLETE
HEIGHTINCHES: 65 in
Weight: 2923.2 oz

## 2015-11-29 LAB — BASIC METABOLIC PANEL
Anion gap: 13 (ref 5–15)
BUN: 12 mg/dL (ref 6–20)
CALCIUM: 9.1 mg/dL (ref 8.9–10.3)
CO2: 25 mmol/L (ref 22–32)
CREATININE: 0.8 mg/dL (ref 0.44–1.00)
Chloride: 101 mmol/L (ref 101–111)
GFR calc non Af Amer: >60 mL/min (ref >60)
Glucose, Bld: 213 mg/dL — ABNORMAL HIGH (ref 65–99)
Potassium: 4 mmol/L (ref 3.5–5.1)
SODIUM: 139 mmol/L (ref 135–145)

## 2015-11-29 LAB — GLUCOSE, CAPILLARY
GLUCOSE-CAPILLARY: 184 mg/dL — AB (ref 65–99)
GLUCOSE-CAPILLARY: 184 mg/dL — AB (ref 65–99)

## 2015-11-29 LAB — CBC
HCT: 44.6 % (ref 36.0–46.0)
Hemoglobin: 14.3 g/dL (ref 12.0–15.0)
MCH: 28.4 pg (ref 26.0–34.0)
MCHC: 32.1 g/dL (ref 30.0–36.0)
MCV: 88.7 fL (ref 78.0–100.0)
PLATELETS: 208 10*3/uL (ref 150–400)
RBC: 5.03 MIL/uL (ref 3.87–5.11)
RDW: 13.8 % (ref 11.5–15.5)
WBC: 9.1 10*3/uL (ref 4.0–10.5)

## 2015-11-29 MED ORDER — NITROGLYCERIN 0.4 MG SL SUBL: 0.4000 mg | SUBLINGUAL_TABLET | SUBLINGUAL | Status: AC | PRN

## 2015-11-29 MED ORDER — SUCRALFATE 1 GM/10ML PO SUSP: 1.0000 g | Freq: Two times a day (BID) | ORAL | Status: DC

## 2015-11-29 MED ORDER — REGADENOSON 0.4 MG/5ML IV SOLN
INTRAVENOUS | Status: AC
  Administered 2015-11-29: 0.4 mg via INTRAVENOUS
  Filled 2015-11-29: qty 5

## 2015-11-29 MED ORDER — SUCRALFATE 1 G PO TABS: 1.0000 g | ORAL_TABLET | Freq: Two times a day (BID) | ORAL | Status: DC

## 2015-11-29 MED ORDER — TECHNETIUM TC 99M SESTAMIBI GENERIC - CARDIOLITE
30.0000 | Freq: Once | INTRAVENOUS | Status: AC | PRN
  Administered 2015-11-29: 30 via INTRAVENOUS

## 2015-11-29 MED ORDER — REGADENOSON 0.4 MG/5ML IV SOLN
0.4000 mg | Freq: Once | INTRAVENOUS | Status: AC
  Administered 2015-11-29: 0.4 mg via INTRAVENOUS

## 2015-11-29 MED ORDER — TECHNETIUM TC 99M SESTAMIBI GENERIC - CARDIOLITE
10.0000 | Freq: Once | INTRAVENOUS | Status: AC | PRN
  Administered 2015-11-29: 10 via INTRAVENOUS

## 2015-11-29 MED ORDER — ONDANSETRON 8 MG PO TBDP: 8.0000 mg | ORAL_TABLET | Freq: Three times a day (TID) | ORAL | Status: DC | PRN

## 2015-11-29 MED ORDER — NITROGLYCERIN 0.4 MG SL SUBL: 0.4000 mg | SUBLINGUAL_TABLET | SUBLINGUAL | Status: DC | PRN

## 2015-11-29 NOTE — Progress Notes (Signed)
*  PRELIMINARY RESULTS* Echocardiogram 2D Echocardiogram has been performed.  Leavy Cella 11/29/2015, 2:44 PM

## 2015-11-29 NOTE — Progress Notes (Signed)
Patient presented for Lexiscan. Tolerated procedure well. Result to follow.   Turner Kunzman, PAC 

## 2015-11-29 NOTE — Progress Notes (Signed)
Patient Profile: 65 y.o. female with a histoy of Non-Obstructive CAD on catherization 03/2012, Hx of SSS S/P Pacer,HTN, DM2, Hyperlipidemia, and Fibromyalgia who presents to the ED with complaints of substernal chest pain radiating into her neck and left arm associated with SOB, nausea andvomiting and diaphoresis, unrelieved with NTG. Initial 2 troponins negative. 3rd troponin mildly abnormal at 0.07. Plan is for NST today. Echo pending.   Subjective: No complaints. Currently CP free. No dyspnea.   Objective: Vital signs in last 24 hours: Temp:  [98.1 F (36.7 C)-99.1 F (37.3 C)] 99.1 F (37.3 C) (03/13 0432) Pulse Rate:  [78-80] 78 (03/13 0432) Resp:  [16] 16 (03/12 2009) BP: (116-122)/(63-68) 116/63 mmHg (03/13 0432) SpO2:  [85 %-98 %] 98 % (03/13 0442) Last BM Date: 11/26/15  Intake/Output from previous day:   Intake/Output this shift:    Medications Current Facility-Administered Medications  Medication Dose Route Frequency Provider Last Rate Last Dose  . 0.9 %  sodium chloride infusion  250 mL Intravenous PRN Theressa Millard, MD      . acetaminophen (TYLENOL) tablet 650 mg  650 mg Oral Q6H PRN Theressa Millard, MD       Or  . acetaminophen (TYLENOL) suppository 650 mg  650 mg Rectal Q6H PRN Theressa Millard, MD      . ALPRAZolam Duanne Moron) tablet 0.5 mg  0.5 mg Oral BID PRN Theressa Millard, MD      . aspirin chewable tablet 324 mg  324 mg Oral Daily Jonetta Osgood, MD   324 mg at 11/28/15 1235  . gabapentin (NEURONTIN) capsule 800 mg  800 mg Oral TID PRN Theressa Millard, MD      . gi cocktail (Maalox,Lidocaine,Donnatal)  30 mL Oral TID PRN Jonetta Osgood, MD      . lisinopril (PRINIVIL,ZESTRIL) tablet 10 mg  10 mg Oral Daily Theressa Millard, MD   10 mg at 11/28/15 0908   And  . hydrochlorothiazide (MICROZIDE) capsule 12.5 mg  12.5 mg Oral Daily Theressa Millard, MD   12.5 mg at 11/28/15 0904  . HYDROmorphone (DILAUDID) injection 0.5-1 mg  0.5-1  mg Intravenous Q3H PRN Theressa Millard, MD   1 mg at 11/29/15 0423  . insulin aspart (novoLOG) injection 0-9 Units  0-9 Units Subcutaneous TID WC Jonetta Osgood, MD   2 Units at 11/29/15 0640  . insulin glargine (LANTUS) injection 42 Units  42 Units Subcutaneous q morning - 10a Theressa Millard, MD   42 Units at 11/28/15 0909  . metoprolol tartrate (LOPRESSOR) tablet 25 mg  25 mg Oral QHS Theressa Millard, MD   25 mg at 11/28/15 2103  . nitroGLYCERIN (NITROGLYN) 2 % ointment 0.5 inch  0.5 inch Topical 4 times per day Theressa Millard, MD   0.5 inch at 11/28/15 1620  . ondansetron (ZOFRAN) tablet 4 mg  4 mg Oral Q6H PRN Theressa Millard, MD       Or  . ondansetron (ZOFRAN) injection 4 mg  4 mg Intravenous Q6H PRN Theressa Millard, MD   4 mg at 11/29/15 0424  . pantoprazole (PROTONIX) EC tablet 40 mg  40 mg Oral BID AC Theressa Millard, MD   40 mg at 11/29/15 0636  . rosuvastatin (CRESTOR) tablet 20 mg  20 mg Oral QHS Theressa Millard, MD   20 mg at 11/28/15 2103  . sodium chloride flush (NS) 0.9 % injection 3 mL  3  mL Intravenous Q12H Theressa Millard, MD      . sodium chloride flush (NS) 0.9 % injection 3 mL  3 mL Intravenous Q12H Theressa Millard, MD   3 mL at 11/28/15 2104  . sodium chloride flush (NS) 0.9 % injection 3 mL  3 mL Intravenous PRN Theressa Millard, MD      . sucralfate (CARAFATE) 1 GM/10ML suspension 1 g  1 g Oral BID Jonetta Osgood, MD   1 g at 11/28/15 2103  . tiZANidine (ZANAFLEX) tablet 4 mg  4 mg Oral Q6H PRN Theressa Millard, MD        PE: General appearance: alert, cooperative and no distress Neck: no carotid bruit and no JVD Lungs: clear to auscultation bilaterally Heart: regular rate and rhythm, S1, S2 normal, no murmur, click, rub or gallop Extremities: no LEE Pulses: 2+ and symmetric Skin: warm and dry Neurologic: Grossly normal  Lab Results:   Recent Labs  11/27/15 1655 11/28/15 0358  WBC 7.9 11.6*  HGB 15.4* 14.5  HCT 44.1  42.9  PLT 191 211   BMET  Recent Labs  11/27/15 1655 11/28/15 0358  NA 138 144  K 3.4* 3.2*  CL 101 103  CO2 22 23  GLUCOSE 213* 144*  BUN 6 6  CREATININE 0.71 0.74  CALCIUM 8.9 9.0   Cardiac Panel (last 3 results)  Recent Labs  11/28/15 0358 11/28/15 1021 11/28/15 1623  TROPONINI 0.03 <0.03 0.07*    Studies/Results: NST - pending  2D Echo pending   Assessment/Plan  Principal Problem:   Chest pain Active Problems:   Hypertension, benign   Hyperlipemia, mixed   DM (diabetes mellitus), type 2, uncontrolled (HCC)   Coronary artery disease   1. Chest Pain: ? Etiology. She has known coronary disease that was nonobstructive by cath in 2013. Cardiac enzymes cycled x 3 with only mild elevation x 1 at 0.07. She is currently CP free. Plan for NST today to assess for ischemia. Will plan for repeat cath if intermediate-high risk. If low risk stress test and normal echo, patient could be discharged home later today from a cardiac standpoint.  2. Nonobstructive CAD: see problem #2. Continue secondary prevention with ASA, statin, BB, ACE-I and management of cardiac risk factors, including HTN, HLD and DM.   3. HTN: BP is well controlled on current regimen. On HCTZ, lisinopril and metoprolol. Followed by PCP.   4. HLD: on Crestor, followed by PCP/ cardiologist at [facility].   5. DM: per IM/ PCP.   6. SSS: s/p PPM. Followed at [facility] M. Rosita Fire, PA-C 11/29/2015 8:11 AM  I have seen and examined the patient along with Brittainy M. Rosita Fire, PA-C.  I have reviewed the chart, notes and new data.  I agree with PA's note.  Key new complaints: mild persistent CP; biggest complaint now is nausea, after eating some crackers after nuclear scan Key examination changes: no overt CHF, A paced V sensed rhythm Key new findings / data: on my review, her nuclear images are low risk. At most, there is a questionable and small basal septal defect which is typical for a pacing  induced artifact (if she had transient V pacing during image acquisition). Review of initial echo images at bedside shows normal LVEF and normal regional wall motion, no overt valvular problems. ECG si abnormal, but unchamged from 2013. The marginal abnormality in 1 of 4 troponin assays is likely not clinically significant.  PLAN: Unless there is  an unexpected abnormality on echo, there is no plan for invasive coronary angiography. Evaluate for non-coronary etiology of symptoms. The significant nausea suggests a possible GI cause (consider diabetic gastroparesis, GERD or esophageal spasm).  Sanda Klein, MD, [place] [phone] 11/29/2015, 1:14 PM

## 2015-11-29 NOTE — Discharge Summary (Addendum)
PATIENT DETAILS Name: [name] Age: 65 y.o. Sex: female Date of Birth: 1951-09-12 MRN: [id number]. Admitting Physician: Theressa Millard, MD [id number], Rosalyn Charters, MD  Admit Date: 11/27/2015 Discharge date: 11/29/2015  Recommendations for Outpatient Follow-up:  1. Please refer to GI- to evaluate recurrent episodes of chest pain (Numerous stress test, LHC negative)  2. Please repeat CBC/BMET at next visit  PRIMARY DISCHARGE DIAGNOSIS:  Principal Problem:   Chest pain Active Problems:   Hypertension, benign   Hyperlipemia, mixed   DM (diabetes mellitus), type 2, uncontrolled ([place])   Coronary artery disease      PAST MEDICAL HISTORY: Past Medical History  Diagnosis Date  . Hypertension   . High cholesterol   . Pacemaker   . Diabetes mellitus   . Arthritis   . Headache(784.0)   . PONV (postoperative nausea and vomiting)   . Coronary artery disease   . Anxiety   . Fibromyalgia   . Dysrhythmia   . 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 Qty: 60 tablet, Refills: 0      CONTINUE these medications which have CHANGED   Details  nitroGLYCERIN (NITROSTAT) 0.4 MG SL tablet Place 1 tablet (0.4 mg total) under the tongue every 5 (five) minutes as needed. For chest pain. Qty: 30 tablet, Refills: 0    ondansetron (ZOFRAN ODT) 8 MG disintegrating tablet Take 1 tablet (8 mg total) by mouth every 8 (eight) hours as needed for nausea. Qty: 20 tablet, Refills: 0      CONTINUE these medications which have NOT CHANGED   Details  ALPRAZolam (XANAX) 1 MG tablet Take 0.5 mg by mouth 2 (two) times daily as needed for anxiety or sleep.     aspirin 81 MG tablet Take 81 mg by mouth daily.      gabapentin (NEURONTIN) 800 MG tablet Take 800 mg by mouth 3 (three) times daily as needed (pain).      insulin aspart (NOVOLOG) 100 UNIT/ML injection Inject 1-10 Units into the skin 3 (three) times daily before meals. Pt takes anywhere from 1-10 units based on sliding scale.    insulin glargine (LANTUS) 100 UNIT/ML injection Inject 42 Units into the skin every morning.     lisinopril-hydrochlorothiazide (PRINZIDE,ZESTORETIC) 10-12.5 MG per tablet Take 1 tablet by mouth every morning.     metFORMIN (GLUCOPHAGE) 850 MG tablet Take 850 mg by mouth 2 (two) times daily with a meal.     metoprolol tartrate (LOPRESSOR) 25 MG tablet Take 25 mg by mouth at bedtime.    pantoprazole (PROTONIX) 40 MG tablet Take 1 tablet (40 mg total) by mouth 2 (two) times daily before a meal. Qty: 60 tablet, Refills: 1    rosuvastatin (CRESTOR) 20 MG tablet Take 20 mg by mouth at bedtime.    tiZANidine (ZANAFLEX) 4 MG tablet Take 4 mg by mouth every 6 (six) hours as needed for muscle spasms.      STOP taking these medications     oxyCODONE-acetaminophen (PERCOCET) 10-325 MG per tablet      famotidine (PEPCID) 20 MG tablet         ALLERGIES:   Allergies  Allergen Reactions  . Iodinated Diagnostic Agents Anaphylaxis  . Shellfish Allergy Anaphylaxis  . Shellfish-Derived Products Anaphylaxis  NO SEAFOOD WHATSOEVER!!  . Carafate [Sucralfate] Nausea And Vomiting  . Cephalexin Nausea And Vomiting  . Cephalexin Other (See Comments)    Gi upset  . Cholestyramine Nausea And Vomiting  . Cyclobenzaprine   . Decadron [Dexamethasone] Nausea And Vomiting  . Dexamethasone Sodium Phosphate Other (See Comments)    Gi upset  . Erythromycin Itching  . Erythromycin Base Hives  . Heparin     Nosebleed  . Iohexol      Desc: pt states reaction is anaphalactic shock from IV dye and any kind of shellfish. pt states she almost died the last time she had a reaction.   Marland Kitchen Ketorolac Other (See Comments)  . Ketorolac Tromethamine Other (See Comments)    unknown  . Ketorolac Tromethamine   . Levofloxacin Hives  .  Methocarbamol Itching and Nausea And Vomiting  . Methocarbamol Nausea And Vomiting  . Morphine And Related   . Other     Iv dye  . Oxycodone Nausea And Vomiting    I.R. (ONLY)  . Penicillins Itching and Other (See Comments)    Gi upset  . Prednisone Itching and Nausea And Vomiting    Jitteriness & severe itching  . Questran Nausea And Vomiting  . Reglan [Metoclopramide] Nausea And Vomiting  . Tape Rash    PLEASE USE PAPER TAPE!!    BRIEF HPI:  See H&P, Labs, Consult and Test reports for all details in brief, patient was admitted for evaluation of chest pain  CONSULTATIONS:   cardiology  PERTINENT RADIOLOGIC STUDIES: Dg Chest 2 View  11/27/2015  CLINICAL DATA:  65 year old female with midsternal chest pain radiating to the left side of the neck and left arm. Emesis 5-6 times this morning. EXAM: CHEST  2 VIEW COMPARISON:  Chest x-ray 10/26/2013. FINDINGS: Lung volumes are low. No consolidative airspace disease. No pleural effusions. No pneumothorax. No pulmonary nodule or mass noted. Pulmonary vasculature and the cardiomediastinal silhouette are within normal limits. Atherosclerosis in the thoracic aorta. Left-sided pacemaker device in position with lead tips projecting over the expected location of the right atrium and right ventricular apex. Surgical clips project over the right upper quadrant of the abdomen, compatible with prior cholecystectomy. IMPRESSION: 1. Low lung volumes without radiographic evidence of acute cardiopulmonary disease. 2. Atherosclerosis. Electronically Signed   By: Vinnie Langton M.D.   On: 11/27/2015 18:31   Nm Myocar Multi W/spect W/wall Motion / Ef  11/29/2015   There was no ST segment deviation noted during stress.  This is a low risk study.  Nuclear stress EF: 71%.  The left ventricular ejection fraction is hyperdynamic (>65%).      PERTINENT LAB RESULTS: CBC:  Recent Labs  11/28/15 0358 11/29/15 1227  WBC 11.6* 9.1  HGB 14.5 14.3  HCT 42.9  44.6  PLT 211 208   CMET CMP     Component Value Date/Time   NA 139 11/29/2015 1227   K 4.0 11/29/2015 1227   CL 101 11/29/2015 1227   CO2 25 11/29/2015 1227   GLUCOSE 213* 11/29/2015 1227   BUN 12 11/29/2015 1227   CREATININE 0.80 11/29/2015 1227   CALCIUM 9.1 11/29/2015 1227   PROT 7.4 06/16/2012 1230   ALBUMIN 3.4* 06/16/2012 1230   AST 31 06/16/2012 1230   ALT 29 06/16/2012 1230   ALKPHOS 246* 06/16/2012 1230   BILITOT 0.2* 06/16/2012 1230   GFRNONAA >60 11/29/2015 1227   GFRAA >60 11/29/2015 1227    GFR Estimated Creatinine Clearance: 74.6 mL/min (by  C-G formula based on Cr of 0.8). No results for input(s): LIPASE, AMYLASE in the last 72 hours.  Recent Labs  11/28/15 0358 11/28/15 1021 11/28/15 1623  TROPONINI 0.03 <0.03 0.07*   Invalid input(s): POCBNP No results for input(s): DDIMER in the last 72 hours. No results for input(s): HGBA1C in the last 72 hours. No results for input(s): CHOL, HDL, LDLCALC, TRIG, CHOLHDL, LDLDIRECT in the last 72 hours. No results for input(s): TSH, T4TOTAL, T3FREE, THYROIDAB in the last 72 hours.  Invalid input(s): FREET3 No results for input(s): VITAMINB12, FOLATE, FERRITIN, TIBC, IRON, RETICCTPCT in the last 72 hours. Coags: No results for input(s): INR in the last 72 hours.  Invalid input(s): PT Microbiology: No results found for this or any previous visit (from the past 240 hour(s)).   BRIEF HOSPITAL COURSE:  Chest pain: No chest pain since admission, but did have fairly typical pain on admit. Troponins negative, EKF paced rhythm. Seen by cardiology-Nuclear stress was negative. Echo showed preserved EF. No further recommendations while inpatient-ok for discharge. She could have had pain due to GI etiology (No RUQ/epigastric tenderness). Will need referral to GI-but can be done in the outpatient setting. Have asked patient to get referral from PCP for GI evaluation. Continue PPI/Carafate.Continue ASA, Metoprolol and statin on  discharge.   Active Problems: GERD: Continue PPI, and Carafate.See above regarding GI referral as outpatient  Hypertension: Controlled, continue metoprolol, lisinopril and HCTZ. Follow.  Hyperlipemia, mixed: Continue statin  Type 2 diabetes: Resume metformin and Lantus on discharge.  Status post pacemaker placement  TODAY-DAY OF DISCHARGE:  Subjective:   Martisha Paonessa today has no headache,no chest abdominal pain,no new weakness tingling or numbness, feels much better wants to go home today.   Objective:   Blood pressure 127/65, pulse 77, temperature 98.1 F (36.7 C), temperature source Oral, resp. rate 18, height 5\' 5"  (1.651 m), weight 82.872 kg (182 lb 11.2 oz), last menstrual period 09/26/2011, SpO2 94 %. No intake or output data in the 24 hours ending 11/29/15 1636 Filed Weights   11/27/15 1637 11/28/15 0337  Weight: 82.555 kg (182 lb) 82.872 kg (182 lb 11.2 oz)    Exam Awake Alert, Oriented *3, No new F.N deficits, Normal affect [place].AT,PERRAL Supple Neck,No JVD, No cervical lymphadenopathy appriciated.  Symmetrical Chest wall movement, Good air movement bilaterally, CTAB RRR,No Gallops,Rubs or new Murmurs, No Parasternal Heave +ve B.Sounds, Abd Soft, Non tender, No organomegaly appriciated, No rebound -guarding or rigidity. No Cyanosis, Clubbing or edema, No new Rash or bruise  DISCHARGE CONDITION: Stable  DISPOSITION: Home  DISCHARGE INSTRUCTIONS:    Activity:  As tolerated   Get Medicines reviewed and adjusted: Please take all your medications with you for your next visit with your Primary MD  Please request your Primary MD to go over all hospital tests and procedure/radiological results at the follow up, please ask your Primary MD to get all Hospital records sent to his/her office.  If you experience worsening of your admission symptoms, develop shortness of breath, life threatening emergency, suicidal or homicidal thoughts you must seek medical attention  immediately by calling 911 or calling your MD immediately  if symptoms less severe.  You must read complete instructions/literature along with all the possible adverse reactions/side effects for all the Medicines you take and that have been prescribed to you. Take any new Medicines after you have completely understood and accpet all the possible adverse reactions/side effects.   Do not drive when taking Pain medications.   Do  not take more than prescribed Pain, Sleep and Anxiety Medications  Special Instructions: If you have smoked or chewed Tobacco  in the last 2 yrs please stop smoking, stop any regular Alcohol  and or any Recreational drug use.  Wear Seat belts while driving.  Please note  You were cared for by a hospitalist during your hospital stay. Once you are discharged, your primary care physician will handle any further medical issues. Please note that NO REFILLS for any discharge medications will be authorized once you are discharged, as it is imperative that you return to your primary care physician (or establish a relationship with a primary care physician if you do not have one) for your aftercare needs so that they can reassess your need for medications and monitor your lab values.   Diet recommendation: Diabetic Diet Heart Healthy diet  Discharge Instructions    Call MD for:  severe uncontrolled pain    Complete by:  As directed      Diet - low sodium heart healthy    Complete by:  As directed      Diet Carb Modified    Complete by:  As directed      Increase activity slowly    Complete by:  As directed            Follow-up Information    Follow up with HAGUE, Rosalyn Charters, MD. Schedule an appointment as soon as possible for a visit in 1 week.   Specialty:  Internal Medicine   Why:  Hospital follow up-please ask for referral to GI doctor   Contact information:   [address] [place] [address] [phone]      Total Time spent on discharge equals 25  minutes.  SignedOren Binet 11/29/2015 4:36 PM

## 2015-11-29 NOTE — Progress Notes (Signed)
Pt has been discharged home. Pt's IV was removed without any complications. Pt received discharge instructions and all questions were answered. Pt's prescriptions have been sent to [facility] and pt is aware of this. Pt was discharged via wheelchair and was accompanied by a nurse tech. Pt collected all of her belongings from the room. Pt was in no distress at time of discharge.   Grant Fontana RN, BSN

## 2015-11-29 NOTE — Care Management Obs Status (Signed)
[facility] NOTIFICATION   Patient Details  Name: [name] MRN: [id number] Date of Birth: 1951/04/13   Medicare Observation Status Notification Given:  Yes    Dawayne Patricia, RN 11/29/2015, 3:19 PM

## 2016-04-01 ENCOUNTER — Emergency Department (HOSPITAL_BASED_OUTPATIENT_CLINIC_OR_DEPARTMENT_OTHER): Payer: Medicare Other

## 2016-04-01 ENCOUNTER — Emergency Department (HOSPITAL_BASED_OUTPATIENT_CLINIC_OR_DEPARTMENT_OTHER)
Admission: EM | Admit: 2016-04-01 | Discharge: 2016-04-01 | Disposition: A | Discharge status: Home / Self Care | Payer: Medicare Other | Attending: Emergency Medicine | Admitting: Emergency Medicine

## 2016-04-01 ENCOUNTER — Encounter (HOSPITAL_BASED_OUTPATIENT_CLINIC_OR_DEPARTMENT_OTHER): Payer: Self-pay | Admitting: Emergency Medicine

## 2016-04-01 DIAGNOSIS — H9202 Otalgia, left ear: Secondary | ICD-10-CM | POA: Diagnosis not present

## 2016-04-01 DIAGNOSIS — Z794 Long term (current) use of insulin: Secondary | ICD-10-CM | POA: Insufficient documentation

## 2016-04-01 DIAGNOSIS — E119 Type 2 diabetes mellitus without complications: Secondary | ICD-10-CM | POA: Diagnosis not present

## 2016-04-01 DIAGNOSIS — R112 Nausea with vomiting, unspecified: Secondary | ICD-10-CM | POA: Diagnosis not present

## 2016-04-01 DIAGNOSIS — I10 Essential (primary) hypertension: Secondary | ICD-10-CM | POA: Insufficient documentation

## 2016-04-01 DIAGNOSIS — Z7982 Long term (current) use of aspirin: Secondary | ICD-10-CM | POA: Insufficient documentation

## 2016-04-01 DIAGNOSIS — R079 Chest pain, unspecified: Secondary | ICD-10-CM | POA: Diagnosis not present

## 2016-04-01 DIAGNOSIS — Z79899 Other long term (current) drug therapy: Secondary | ICD-10-CM | POA: Diagnosis not present

## 2016-04-01 DIAGNOSIS — N390 Urinary tract infection, site not specified: Secondary | ICD-10-CM | POA: Diagnosis not present

## 2016-04-01 DIAGNOSIS — J029 Acute pharyngitis, unspecified: Secondary | ICD-10-CM | POA: Insufficient documentation

## 2016-04-01 DIAGNOSIS — M545 Low back pain: Secondary | ICD-10-CM | POA: Diagnosis present

## 2016-04-01 DIAGNOSIS — Z8542 Personal history of malignant neoplasm of other parts of uterus: Secondary | ICD-10-CM | POA: Diagnosis not present

## 2016-04-01 LAB — URINE MICROSCOPIC-ADD ON: RBC / HPF: NONE SEEN RBC/hpf (ref 0–5)

## 2016-04-01 LAB — URINALYSIS, ROUTINE W REFLEX MICROSCOPIC
BILIRUBIN URINE: NEGATIVE
HGB URINE DIPSTICK: NEGATIVE
Ketones, ur: NEGATIVE mg/dL
Leukocytes, UA: NEGATIVE
Nitrite: NEGATIVE
PROTEIN: NEGATIVE mg/dL
SPECIFIC GRAVITY, URINE: 1.021 (ref 1.005–1.030)
pH: 5.5 (ref 5.0–8.0)

## 2016-04-01 LAB — CBC WITH DIFFERENTIAL/PLATELET
BLASTS: 0 %
Band Neutrophils: 0 %
Basophils Absolute: 0 10*3/uL (ref 0.0–0.1)
Basophils Relative: 0 %
EOS PCT: 1 %
Eosinophils Absolute: 0.1 10*3/uL (ref 0.0–0.7)
HCT: 43.1 % (ref 36.0–46.0)
HEMOGLOBIN: 15 g/dL (ref 12.0–15.0)
Lymphocytes Relative: 48 %
Lymphs Abs: 5 10*3/uL — ABNORMAL HIGH (ref 0.7–4.0)
MCH: 29.6 pg (ref 26.0–34.0)
MCHC: 34.8 g/dL (ref 30.0–36.0)
MCV: 85.2 fL (ref 78.0–100.0)
METAMYELOCYTES PCT: 0 %
MONO ABS: 0.4 10*3/uL (ref 0.1–1.0)
MONOS PCT: 4 %
MYELOCYTES: 0 %
NEUTROS PCT: 47 %
NRBC: 0 /100{WBCs}
Neutro Abs: 4.9 10*3/uL (ref 1.7–7.7)
Platelets: ADEQUATE 10*3/uL (ref 150–400)
Promyelocytes Absolute: 0 %
RBC: 5.06 MIL/uL (ref 3.87–5.11)
RDW: 14.1 % (ref 11.5–15.5)
WBC: 10.4 10*3/uL (ref 4.0–10.5)

## 2016-04-01 LAB — COMPREHENSIVE METABOLIC PANEL
ALT: 31 U/L (ref 14–54)
ANION GAP: 9 (ref 5–15)
AST: 32 U/L (ref 15–41)
Albumin: 4 g/dL (ref 3.5–5.0)
Alkaline Phosphatase: 201 U/L — ABNORMAL HIGH (ref 38–126)
BUN: 8 mg/dL (ref 6–20)
CHLORIDE: 105 mmol/L (ref 101–111)
CO2: 25 mmol/L (ref 22–32)
CREATININE: 0.73 mg/dL (ref 0.44–1.00)
Calcium: 8.9 mg/dL (ref 8.9–10.3)
Glucose, Bld: 143 mg/dL — ABNORMAL HIGH (ref 65–99)
Potassium: 3.6 mmol/L (ref 3.5–5.1)
SODIUM: 139 mmol/L (ref 135–145)
Total Bilirubin: 0.6 mg/dL (ref 0.3–1.2)
Total Protein: 7.9 g/dL (ref 6.5–8.1)

## 2016-04-01 LAB — LIPASE, BLOOD: LIPASE: 24 U/L (ref 11–51)

## 2016-04-01 LAB — TROPONIN I

## 2016-04-01 MED ORDER — SODIUM CHLORIDE 0.9 % IV BOLUS (SEPSIS)
500.0000 mL | Freq: Once | INTRAVENOUS | Status: AC
  Administered 2016-04-01: 500 mL via INTRAVENOUS

## 2016-04-01 MED ORDER — HYDROMORPHONE HCL 1 MG/ML IJ SOLN
INTRAMUSCULAR | Status: AC
  Administered 2016-04-01: 2 mg via INTRAMUSCULAR
  Filled 2016-04-01: qty 2

## 2016-04-01 MED ORDER — ONDANSETRON 4 MG PO TBDP
4.0000 mg | ORAL_TABLET | Freq: Once | ORAL | Status: AC
  Administered 2016-04-01: 4 mg via ORAL
  Filled 2016-04-01: qty 1

## 2016-04-01 MED ORDER — HYDROMORPHONE HCL 4 MG PO TABS
4.0000 mg | ORAL_TABLET | Freq: Four times a day (QID) | ORAL | Status: DC | PRN
Start: 2016-04-01 — End: 2016-12-14

## 2016-04-01 MED ORDER — SODIUM CHLORIDE 0.9 % IV SOLN
INTRAVENOUS | Status: DC
Start: 2016-04-01 — End: 2016-04-01

## 2016-04-01 MED ORDER — CIPROFLOXACIN HCL 500 MG PO TABS: 500.0000 mg | ORAL_TABLET | Freq: Two times a day (BID) | ORAL | Status: DC

## 2016-04-01 MED ORDER — HYDROMORPHONE HCL 2 MG/ML IJ SOLN
2.0000 mg | Freq: Once | INTRAMUSCULAR | Status: DC
  Filled 2016-04-01: qty 1

## 2016-04-01 MED ORDER — ONDANSETRON HCL 4 MG/2ML IJ SOLN
4.0000 mg | Freq: Once | INTRAMUSCULAR | Status: AC
  Administered 2016-04-01: 4 mg via INTRAVENOUS
  Filled 2016-04-01: qty 2

## 2016-04-01 MED ORDER — ONDANSETRON 4 MG PO TBDP: 4.0000 mg | ORAL_TABLET | Freq: Three times a day (TID) | ORAL | Status: DC | PRN

## 2016-04-01 NOTE — ED Provider Notes (Signed)
CSN: [id number]     Arrival date & time 04/01/16  1446 History  By signing my name below, I, [name], attest that this documentation has been prepared under the direction and in the presence of Fredia Sorrow, MD. Electronically Signed: Randa Zimmerman, ED Scribe. 04/01/2016. 3:24 PM.      Chief Complaint  Patient presents with  . Cough   HPI HPI Comments: [name] is a 65 y.o. female who presents to the Emergency Department complaining of several symptoms that began with voice change 3 days prior. Pt also reports left sided sore throat, left ear pain, low back pain, nausea, vomiting CP. Pt reports that the intermittent CP that would last for about 3-4 minutes and began last night at 10 PM. Pt doesn't report any medications PTA. Denies diarrhea, fever, dysuria,    Past Medical History  Diagnosis Date  . Hypertension   . High cholesterol   . Pacemaker   . Diabetes mellitus   . Arthritis   . Headache(784.0)   . PONV (postoperative nausea and vomiting)   . Coronary artery disease   . Anxiety   . Fibromyalgia   . Dysrhythmia   . 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 Abdominal hysterectomy    . Joint replacement      right knee  . Cholecystectomy      2005-2006  . Insert / replace / remove pacemaker      2008, [employer], Pacemaker  . Fracture surgery      left ankle  . Left heart catheterization with coronary angiogram N/A 03/19/2012    Procedure: LEFT HEART CATHETERIZATION WITH CORONARY ANGIOGRAM;  Surgeon: Peter M Martinique, MD;  Location: [facility] CATH LAB;  Service: Cardiovascular;  Laterality: N/A;   Family History  Problem Relation Age of Onset  . Coronary artery disease Mother     s/p stenting, currently 36  . Cancer Father     heavy smoker, died of lung cancer in 26  . Brain cancer Brother     ? CAD   Social History  Substance Use Topics  . Smoking status: Never Smoker   . Smokeless tobacco: Never Used   . Alcohol Use: No   OB History    No data available     Review of Systems  Constitutional: Negative for fever and chills.  HENT: Positive for ear pain, sore throat and voice change. Negative for rhinorrhea.   Eyes: Negative for visual disturbance.  Respiratory: Negative for cough and shortness of breath.   Cardiovascular: Positive for chest pain. Negative for leg swelling.  Gastrointestinal: Positive for nausea and vomiting. Negative for abdominal pain and diarrhea.  Genitourinary: Negative for dysuria.  Musculoskeletal: Positive for back pain. Negative for neck pain.  Skin: Negative for rash.  Neurological: Negative for headaches.  Hematological: Does not bruise/bleed easily.  Psychiatric/Behavioral: Negative for confusion.      Allergies  Iodinated diagnostic agents; Shellfish allergy; Shellfish-derived products; Carafate; Cephalexin; Cephalexin; Cholestyramine; Cyclobenzaprine; Decadron; Dexamethasone sodium phosphate; Erythromycin; Erythromycin base; Heparin; Iohexol; Ketorolac; Ketorolac tromethamine; Ketorolac tromethamine; Levofloxacin; Methocarbamol; Methocarbamol; Morphine and related; Other; Oxycodone; Penicillins; Prednisone; Questran; Reglan; and Tape  Home Medications   Prior to Admission medications   Medication Sig Start Date End Date Taking? Authorizing Provider  ALPRAZolam Duanne Moron) 1 MG tablet Take 0.5 mg by mouth 2 (two) times daily as needed for anxiety or sleep.     Historical Provider, MD  aspirin 81 MG tablet Take 81 mg by mouth daily.      Historical Provider, MD  ciprofloxacin (CIPRO) 500 MG tablet Take 1 tablet (500 mg total) by mouth 2 (two) times daily. 04/01/16   Fredia Sorrow, MD  gabapentin (NEURONTIN) 800 MG tablet Take 800 mg by mouth 3 (three) times daily as needed (pain).    Historical Provider, MD  HYDROmorphone (DILAUDID) 4 MG tablet Take 1 tablet (4 mg total) by mouth every 6 (six) hours as needed for severe pain. 04/01/16   Fredia Sorrow, MD   insulin aspart (NOVOLOG) 100 UNIT/ML injection Inject 1-10 Units into the skin 3 (three) times daily before meals. Pt takes anywhere from 1-10 units based on sliding scale.    Historical Provider, MD  insulin glargine (LANTUS) 100 UNIT/ML injection Inject 42 Units into the skin every morning.     Historical Provider, MD  lisinopril-hydrochlorothiazide (PRINZIDE,ZESTORETIC) 10-12.5 MG per tablet Take 1 tablet by mouth every morning.     Historical Provider, MD  metFORMIN (GLUCOPHAGE) 850 MG tablet Take 850 mg by mouth 2 (two) times daily with a meal.     Historical Provider, MD  metoprolol tartrate (LOPRESSOR) 25 MG tablet Take 25 mg by mouth at bedtime.    Historical Provider, MD  nitroGLYCERIN (NITROSTAT) 0.4 MG SL tablet Place 1 tablet (0.4 mg total) under the tongue every 5 (five) minutes as needed. For chest pain. 11/29/15 04/01/20  Shanker Kristeen Mans, MD  ondansetron (ZOFRAN ODT) 4 MG disintegrating tablet Take 1 tablet (4 mg total) by mouth every 8 (eight) hours as needed for nausea or vomiting. 04/01/16   Fredia Sorrow, MD  ondansetron (ZOFRAN ODT) 8 MG disintegrating tablet Take 1 tablet (8 mg total) by mouth every 8 (eight) hours as needed for nausea. 11/29/15   Shanker Kristeen Mans, MD  pantoprazole (PROTONIX) 40 MG tablet Take 1 tablet (40 mg total) by mouth 2 (two) times daily before a meal. 10/27/13   Barton Dubois, MD  rosuvastatin (CRESTOR) 20 MG tablet Take 20 mg by mouth at bedtime.    Historical Provider, MD  sucralfate (CARAFATE) 1 g tablet Take 1 tablet (1 g total) by mouth 2 (two) times daily. 11/29/15   Shanker Kristeen Mans, MD  tiZANidine (ZANAFLEX) 4 MG tablet Take 4 mg by mouth every 6 (six) hours as needed for muscle spasms.    Historical Provider, MD   BP 131/73 mmHg  Pulse 67  Temp(Src) 98.3 F (36.8 C) (Oral)  Resp 14  Ht 5\' 5"  (1.651 m)  Wt 83.008 kg  BMI 30.45 kg/m2  SpO2 95%  LMP 09/26/2011   Physical Exam  Constitutional: She is oriented to person, place, and time. She  appears well-developed and well-nourished. No distress.  HENT:  Head: Normocephalic and atraumatic.  Right Ear: Tympanic membrane and ear canal normal.  Left Ear: Tympanic membrane and ear canal normal.  Mouth/Throat: Uvula is midline. No oropharyngeal exudate.  Mucous membranes slightly dry.   Eyes: Conjunctivae and EOM are normal. Pupils are equal, round, and reactive to light. No scleral icterus.  Neck: Neck supple. No tracheal deviation present.  Cardiovascular: Normal rate, regular rhythm and normal heart sounds.   Pulmonary/Chest: Effort normal and breath sounds normal. No respiratory distress. She has no wheezes. She has no rales.  Abdominal: Bowel sounds are normal. There is no tenderness.  Musculoskeletal: Normal range of motion. She exhibits no edema.  Neurological: She is alert and oriented to person, place, and time. No cranial  nerve deficit.  Skin: Skin is warm and dry.  Psychiatric: She has a normal mood and affect. Her behavior is normal.  Nursing note and vitals reviewed.   ED Course  Procedures (including critical care time) DIAGNOSTIC STUDIES: Oxygen Saturation is 100% on RA, normal by my interpretation.    COORDINATION OF CARE 3:31 PM-Discussed treatment plan which includes CXR and UA with pt at bedside and pt agreed to plan.     Labs Review Labs Reviewed  URINALYSIS, ROUTINE W REFLEX MICROSCOPIC (NOT AT [facility]) - Abnormal; Notable for the following:    Glucose, UA >1000 (*)    All other components within normal limits  COMPREHENSIVE METABOLIC PANEL - Abnormal; Notable for the following:    Glucose, Bld 143 (*)    Alkaline Phosphatase 201 (*)    All other components within normal limits  CBC WITH DIFFERENTIAL/PLATELET - Abnormal; Notable for the following:    Lymphs Abs 5.0 (*)    All other components within normal limits  URINE MICROSCOPIC-ADD ON - Abnormal; Notable for the following:    Squamous Epithelial / LPF 0-5 (*)    Bacteria, UA MANY (*)    All  other components within normal limits  URINE CULTURE  TROPONIN I  LIPASE, BLOOD   Results for orders placed or performed during the hospital encounter of 04/01/16  Urinalysis, Routine w reflex microscopic (not at [facility])  Result Value Ref Range   Color, Urine YELLOW YELLOW   APPearance CLEAR CLEAR   Specific Gravity, Urine 1.021 1.005 - 1.030   pH 5.5 5.0 - 8.0   Glucose, UA >1000 (A) NEGATIVE mg/dL   Hgb urine dipstick NEGATIVE NEGATIVE   Bilirubin Urine NEGATIVE NEGATIVE   Ketones, ur NEGATIVE NEGATIVE mg/dL   Protein, ur NEGATIVE NEGATIVE mg/dL   Nitrite NEGATIVE NEGATIVE   Leukocytes, UA NEGATIVE NEGATIVE  Troponin I  Result Value Ref Range   Troponin I <0.03 <0.03 ng/mL  Comprehensive metabolic panel  Result Value Ref Range   Sodium 139 135 - 145 mmol/L   Potassium 3.6 3.5 - 5.1 mmol/L   Chloride 105 101 - 111 mmol/L   CO2 25 22 - 32 mmol/L   Glucose, Bld 143 (H) 65 - 99 mg/dL   BUN 8 6 - 20 mg/dL   Creatinine, Ser 0.73 0.44 - 1.00 mg/dL   Calcium 8.9 8.9 - 10.3 mg/dL   Total Protein 7.9 6.5 - 8.1 g/dL   Albumin 4.0 3.5 - 5.0 g/dL   AST 32 15 - 41 U/L   ALT 31 14 - 54 U/L   Alkaline Phosphatase 201 (H) 38 - 126 U/L   Total Bilirubin 0.6 0.3 - 1.2 mg/dL   GFR calc non Af Amer >60 >60 mL/min   GFR calc Af Amer >60 >60 mL/min   Anion gap 9 5 - 15  Lipase, blood  Result Value Ref Range   Lipase 24 11 - 51 U/L  CBC with Differential/Platelet  Result Value Ref Range   WBC 10.4 4.0 - 10.5 K/uL   RBC 5.06 3.87 - 5.11 MIL/uL   Hemoglobin 15.0 12.0 - 15.0 g/dL   HCT 43.1 36.0 - 46.0 %   MCV 85.2 78.0 - 100.0 fL   MCH 29.6 26.0 - 34.0 pg   MCHC 34.8 30.0 - 36.0 g/dL   RDW 14.1 11.5 - 15.5 %   Platelets  150 - 400 K/uL    PLATELET CLUMPS NOTED ON SMEAR, COUNT APPEARS ADEQUATE   Neutrophils Relative %  47 %   Lymphocytes Relative 48 %   Monocytes Relative 4 %   Eosinophils Relative 1 %   Basophils Relative 0 %   Band Neutrophils 0 %   Metamyelocytes Relative 0 %    Myelocytes 0 %   Promyelocytes Absolute 0 %   Blasts 0 %   nRBC 0 0 /100 WBC   Neutro Abs 4.9 1.7 - 7.7 K/uL   Lymphs Abs 5.0 (H) 0.7 - 4.0 K/uL   Monocytes Absolute 0.4 0.1 - 1.0 K/uL   Eosinophils Absolute 0.1 0.0 - 0.7 K/uL   Basophils Absolute 0.0 0.0 - 0.1 K/uL   WBC Morphology ATYPICAL LYMPHOCYTES   Urine microscopic-add on  Result Value Ref Range   Squamous Epithelial / LPF 0-5 (A) NONE SEEN   WBC, UA 0-5 0 - 5 WBC/hpf   RBC / HPF NONE SEEN 0 - 5 RBC/hpf   Bacteria, UA MANY (A) NONE SEEN     Imaging Review Dg Chest 2 View  04/01/2016  CLINICAL DATA:  Left-sided sore throat, left ear pain and low back pain. EXAM: CHEST  2 VIEW COMPARISON:  11/27/2015 FINDINGS: Left chest wall pacer device is noted with lead in the right atrial appendage and right ventricle. The heart size and mediastinal contours are within normal limits. Both lungs are clear. Degenerative disc disease is identified throughout the thoracic spine. IMPRESSION: 1. No acute cardiopulmonary abnormalities. 2. Thoracic spondylosis. Electronically Signed   By: Kerby Moors M.D.   On: 04/01/2016 16:15      EKG Interpretation   Date/Time:  Saturday April 01 2016 15:35:00 EDT Ventricular Rate:  63 PR Interval:    QRS Duration: 120 QT Interval:  441 QTC Calculation: 452 R Axis:   23 Text Interpretation:  Sinus rhythm Consider left atrial enlargement  Nonspecific intraventricular conduction delay Nonspecific T abnrm,  anterolateral leads No significant change since last tracing ATRIAL PACED  RHYTHM ? Confirmed by Rogene Houston  MD, [place] [phone]) on 04/01/2016 3:41:04 PM      MDM   Final diagnoses:  UTI (lower urinary tract infection)  Non-intractable vomiting with nausea, vomiting of unspecified type   Patient patient presenting with multiple complaints. Workup without significant findings other than elevated alkaline phosphatase with normal bilirubin urinalysis consistent with urinary tract infection. No  leukocytosis. EKG without acute findings. Chest x-ray negative for any acute abnormalities. Patient has extensive allergy list but most of these are side effects. Patient is able to take Cipro. Will treat the urinary tract infection with Cipro. Urine culture pending. Patient will follow-up with her primary care doctor regarding the elevated alkaline phosphatase. Patient is nausea and vomiting will be treated with Zofran and some hydromorphone supplement for pain control. Patient without any acute abdominal findings.   I personally performed the services described in this documentation, which was scribed in my presence. The recorded information has been reviewed and is accurate.        Fredia Sorrow, MD 04/01/16 [phone]

## 2016-04-01 NOTE — Discharge Instructions (Signed)
Take antibiotic as directed. Take the Zofran for nausea and vomiting. Take the hydromorphone as needed for a. Make an appointment to follow-up with your regular doctor. Urine culture has been sent.

## 2016-04-01 NOTE — ED Notes (Signed)
Patient states that she started to have a cough on wed. Now having generalizes weakness and feeling sick

## 2016-04-03 LAB — URINE CULTURE

## 2016-04-14 ENCOUNTER — Telehealth (HOSPITAL_BASED_OUTPATIENT_CLINIC_OR_DEPARTMENT_OTHER): Payer: Self-pay

## 2016-04-15 ENCOUNTER — Encounter (HOSPITAL_BASED_OUTPATIENT_CLINIC_OR_DEPARTMENT_OTHER): Payer: Self-pay | Admitting: Emergency Medicine

## 2016-04-15 ENCOUNTER — Emergency Department (HOSPITAL_BASED_OUTPATIENT_CLINIC_OR_DEPARTMENT_OTHER)
Admission: EM | Admit: 2016-04-15 | Discharge: 2016-04-15 | Disposition: A | Discharge status: Home / Self Care | Payer: Medicare Other | Attending: Emergency Medicine | Admitting: Emergency Medicine

## 2016-04-15 DIAGNOSIS — Z79899 Other long term (current) drug therapy: Secondary | ICD-10-CM | POA: Insufficient documentation

## 2016-04-15 DIAGNOSIS — M1990 Unspecified osteoarthritis, unspecified site: Secondary | ICD-10-CM | POA: Diagnosis not present

## 2016-04-15 DIAGNOSIS — Z7984 Long term (current) use of oral hypoglycemic drugs: Secondary | ICD-10-CM | POA: Insufficient documentation

## 2016-04-15 DIAGNOSIS — I10 Essential (primary) hypertension: Secondary | ICD-10-CM | POA: Insufficient documentation

## 2016-04-15 DIAGNOSIS — Z7982 Long term (current) use of aspirin: Secondary | ICD-10-CM | POA: Insufficient documentation

## 2016-04-15 DIAGNOSIS — N390 Urinary tract infection, site not specified: Secondary | ICD-10-CM | POA: Insufficient documentation

## 2016-04-15 DIAGNOSIS — I2510 Atherosclerotic heart disease of native coronary artery without angina pectoris: Secondary | ICD-10-CM | POA: Insufficient documentation

## 2016-04-15 DIAGNOSIS — E119 Type 2 diabetes mellitus without complications: Secondary | ICD-10-CM | POA: Diagnosis not present

## 2016-04-15 DIAGNOSIS — Z794 Long term (current) use of insulin: Secondary | ICD-10-CM | POA: Diagnosis not present

## 2016-04-15 DIAGNOSIS — Z8542 Personal history of malignant neoplasm of other parts of uterus: Secondary | ICD-10-CM | POA: Diagnosis not present

## 2016-04-15 LAB — URINALYSIS, ROUTINE W REFLEX MICROSCOPIC
BILIRUBIN URINE: NEGATIVE
Ketones, ur: NEGATIVE mg/dL
Nitrite: POSITIVE — AB
Protein, ur: NEGATIVE mg/dL
SPECIFIC GRAVITY, URINE: 1.022 (ref 1.005–1.030)
pH: 5.5 (ref 5.0–8.0)

## 2016-04-15 LAB — URINE MICROSCOPIC-ADD ON

## 2016-04-15 MED ORDER — ACETAMINOPHEN 500 MG PO TABS: 1000.0000 mg | ORAL_TABLET | Freq: Once | ORAL | Status: DC

## 2016-04-15 MED ORDER — PHENAZOPYRIDINE HCL 200 MG PO TABS: 200.0000 mg | ORAL_TABLET | Freq: Three times a day (TID) | ORAL | 0 refills | Status: DC

## 2016-04-15 MED ORDER — FOSFOMYCIN TROMETHAMINE 3 G PO PACK
3.0000 g | PACK | Freq: Once | ORAL | Status: AC
  Administered 2016-04-15: 3 g via ORAL
  Filled 2016-04-15: qty 3

## 2016-04-15 NOTE — ED Provider Notes (Signed)
[place] DEPT [facility]HP Provider Note   CSN: [id number] Arrival date & time: 04/15/16  1435  By signing my name below, I, [name], attest that this documentation has been prepared under the direction and in the presence of [name], DO. Electronically Signed: [name], ED Scribe. 04/15/16. 4:01 PM.   First MD Initiated Contact with Patient 04/15/16 1530     History   Chief Complaint Chief Complaint  Patient presents with  . Urinary Tract Infection   The history is provided by the patient. No language interpreter was used.   HPI Comments: [name] is a 65 y.o. female who presents to the Emergency Department complaining of urinary tract infection. Pt reports this is the second time she has been seen for a UTI. Pt reports the Cipro usually will help, but this time has not helped with relieving her symptoms. Pt reports associated occasional hematuria, nausea, vomiting, lower abdominal pain and lower back pain. Pt reports burning sensation when she urinates. Pt reports occasional problem with urgency. Pt reports she took 2 Zofran yesterday and 1 this morning. Pt denies fever, vaginal discharge, vaginal bleeding. She reports PSHx of Hysterectomy and C-Section 45 years ago.  Past Medical History:  Diagnosis Date  . Anxiety   . Arthritis   . 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 Coronary artery disease   . Diabetes mellitus   . Dysrhythmia   . Fibromyalgia   . Headache(784.0)   . High cholesterol   . Hypertension   . Pacemaker   . PONV (postoperative nausea and vomiting)     Patient Active Problem List   Diagnosis Date Noted  . Coronary artery disease 11/28/2015  . Chest pain 10/26/2013  . Nausea and vomiting 03/19/2012  . Diarrhea due to drug-rule out C. difficile colitis 03/18/2012  . Fibromyalgia 03/18/2012  . Anxiety and depression with documented history of narcotic seeking behavior (2009) 03/18/2012  . DM (diabetes mellitus), type 2, uncontrolled ([place])  03/17/2012  . Acute coronary syndrome ([place]) 08/20/2011  . Nausea and vomiting in adult with history of recurrent abdominal pain/negative sigmoidoscopy and EGD 2008 07/31/2011    Class: Acute  . Morbid obesity, BMI not known ([place]) 07/31/2011  . Diabetes mellitus type 2, controlled ([place]) 07/23/2011  . Hypertension, benign 07/23/2011  . Hyperlipemia, mixed 07/23/2011  . Sinus node dysfunction ([place]) 07/23/2011  . Chest pain syndrome 07/23/2011    Past Surgical History:  Procedure Laterality Date  . ABDOMINAL HYSTERECTOMY    . CHOLECYSTECTOMY     2005-2006  . FRACTURE SURGERY     left ankle  . INSERT / REPLACE / REMOVE PACEMAKER     2008, [employer], Pacemaker  . JOINT REPLACEMENT     right knee  . LEFT HEART CATHETERIZATION WITH CORONARY ANGIOGRAM N/A 03/19/2012   Procedure: LEFT HEART CATHETERIZATION WITH CORONARY ANGIOGRAM;  Surgeon: Peter M Martinique, MD;  Location: [facility] CATH LAB;  Service: Cardiovascular;  Laterality: N/A;    OB History    No data available       Home Medications    Prior to Admission medications   Medication Sig Start Date End Date Taking? Authorizing Provider  ALPRAZolam Duanne Moron) 1 MG tablet Take 0.25 mg by mouth 2 (two) times daily as needed for anxiety or sleep.    Yes Historical Provider, MD  aspirin 81 MG tablet Take 81 mg by mouth daily.     Yes Historical Provider, MD  ciprofloxacin (CIPRO) 500 MG tablet Take  1 tablet (500 mg total) by mouth 2 (two) times daily. 04/01/16  Yes Fredia Sorrow, MD  gabapentin (NEURONTIN) 800 MG tablet Take 800 mg by mouth 3 (three) times daily as needed (pain).   Yes Historical Provider, MD  insulin aspart (NOVOLOG) 100 UNIT/ML injection Inject 1-10 Units into the skin 3 (three) times daily before meals. Pt takes anywhere from 1-10 units based on sliding scale.   Yes Historical Provider, MD  insulin glargine (LANTUS) 100 UNIT/ML injection Inject 42 Units into the skin every morning.    Yes Historical Provider, MD    lisinopril-hydrochlorothiazide (PRINZIDE,ZESTORETIC) 10-12.5 MG per tablet Take 1 tablet by mouth every morning.    Yes Historical Provider, MD  metoprolol tartrate (LOPRESSOR) 25 MG tablet Take 25 mg by mouth at bedtime.   Yes Historical Provider, MD  nitroGLYCERIN (NITROSTAT) 0.4 MG SL tablet Place 1 tablet (0.4 mg total) under the tongue every 5 (five) minutes as needed. For chest pain. 11/29/15 04/01/20 Yes Shanker Kristeen Mans, MD  ondansetron (ZOFRAN ODT) 4 MG disintegrating tablet Take 1 tablet (4 mg total) by mouth every 8 (eight) hours as needed for nausea or vomiting. 04/01/16  Yes Fredia Sorrow, MD  pantoprazole (PROTONIX) 40 MG tablet Take 1 tablet (40 mg total) by mouth 2 (two) times daily before a meal. 10/27/13  Yes Barton Dubois, MD  rosuvastatin (CRESTOR) 20 MG tablet Take 20 mg by mouth at bedtime.   Yes Historical Provider, MD  sucralfate (CARAFATE) 1 g tablet Take 1 tablet (1 g total) by mouth 2 (two) times daily. 11/29/15  Yes Shanker Kristeen Mans, MD  tiZANidine (ZANAFLEX) 4 MG tablet Take 4 mg by mouth every 6 (six) hours as needed for muscle spasms.   Yes Historical Provider, MD  HYDROmorphone (DILAUDID) 4 MG tablet Take 1 tablet (4 mg total) by mouth every 6 (six) hours as needed for severe pain. 04/01/16   Fredia Sorrow, MD  metFORMIN (GLUCOPHAGE) 850 MG tablet Take 850 mg by mouth 2 (two) times daily with a meal.     Historical Provider, MD  ondansetron (ZOFRAN ODT) 8 MG disintegrating tablet Take 1 tablet (8 mg total) by mouth every 8 (eight) hours as needed for nausea. 11/29/15   Shanker Kristeen Mans, MD  phenazopyridine (PYRIDIUM) 200 MG tablet Take 1 tablet (200 mg total) by mouth 3 (three) times daily. 04/15/16   [name], DO    Family History Family History  Problem Relation Age of Onset  . Coronary artery disease Mother     s/p stenting, currently 49  . Cancer Father     heavy smoker, died of lung cancer in 40  . Brain cancer Brother     ? CAD    Social  History Social History  Substance Use Topics  . Smoking status: Never Smoker  . Smokeless tobacco: Never Used  . Alcohol use No     Allergies   Iodinated diagnostic agents; Shellfish allergy; Shellfish-derived products; Carafate [sucralfate]; Cephalexin; Cephalexin; Cholestyramine; Cyclobenzaprine; Decadron [dexamethasone]; Dexamethasone sodium phosphate; Erythromycin; Erythromycin base; Heparin; Iohexol; Ketorolac; Ketorolac tromethamine; Ketorolac tromethamine; Levofloxacin; Methocarbamol; Methocarbamol; Morphine and related; Other; Oxycodone; Penicillins; Prednisone; Questran; Reglan [metoclopramide]; and Tape   Review of Systems Review of Systems  Constitutional: Negative for chills and fever.  HENT: Negative for congestion and rhinorrhea.   Eyes: Negative for redness and visual disturbance.  Respiratory: Negative for shortness of breath and wheezing.   Cardiovascular: Negative for chest pain and palpitations.  Gastrointestinal: Positive for abdominal pain, nausea  and vomiting.  Genitourinary: Positive for hematuria and urgency. Negative for dysuria, vaginal bleeding and vaginal discharge.  Musculoskeletal: Positive for back pain. Negative for arthralgias and myalgias.  Skin: Negative for pallor and wound.  Neurological: Negative for dizziness and headaches.  All other systems reviewed and are negative.    Physical Exam Updated Vital Signs BP 141/94 (BP Location: Left Arm)   Pulse 62   Temp 97.7 F (36.5 C) (Oral)   Resp 20   LMP 09/26/2011   SpO2 98%   Physical Exam  Constitutional: She is oriented to person, place, and time. She appears well-developed and well-nourished. No distress.  HENT:  Head: Normocephalic and atraumatic.  Eyes: EOM are normal. Pupils are equal, round, and reactive to light.  Neck: Normal range of motion. Neck supple.  Cardiovascular: Normal rate and regular rhythm.  Exam reveals no gallop and no friction rub.   No murmur heard. Pacemaker   Pulmonary/Chest: Effort normal. She has no wheezes. She has no rales.  Abdominal: Soft. She exhibits no distension. There is no tenderness.  Midline surgical scar below the umbilicus  Musculoskeletal: She exhibits no edema or tenderness.  Neurological: She is alert and oriented to person, place, and time.  Skin: Skin is warm and dry. She is not diaphoretic.  Psychiatric: She has a normal mood and affect. Her behavior is normal.  Nursing note and vitals reviewed.    ED Treatments / Results  DIAGNOSTIC STUDIES: Oxygen Saturation is 98% on RA, normal by my interpretation.    COORDINATION OF CARE: 3:34 PM Discussed treatment plan with pt at bedside which includes Urinalysis, Pyridium and pt agreed to plan. Discussed follow up with Urologist.  Labs (all labs ordered are listed, but only abnormal results are displayed) Labs Reviewed  URINALYSIS, [place] (NOT AT [facility]) - Abnormal; Notable for the following:       Result Value   Color, Urine AMBER (*)    APPearance CLOUDY (*)    Glucose, UA >1000 (*)    Hgb urine dipstick SMALL (*)    Nitrite POSITIVE (*)    Leukocytes, UA SMALL (*)    All other components within normal limits  URINE MICROSCOPIC-ADD ON - Abnormal; Notable for the following:    Squamous Epithelial / LPF 0-5 (*)    Bacteria, UA MANY (*)    All other components within normal limits  URINE CULTURE    EKG  EKG Interpretation None       Radiology No results found.  Procedures Procedures (including critical care time)  Medications Ordered in ED Medications  fosfomycin (MONUROL) packet 3 g (3 g Oral Given 04/15/16 1553)     Initial Impression / Assessment and Plan / ED Course  I have reviewed the triage vital signs and the nursing notes.  Pertinent labs & imaging results that were available during my care of the patient were reviewed by me and considered in my medical decision making (see chart for details).  Clinical Course    65  yo F With a chief complaints of a urinary tract infection. This is recurrent in with apparently resistant to ciprofloxacin. Patient has a number of allergies. She has no signs of pyelonephritis no fevers or chills no CVA tenderness. L treat with fosfomycin. Sent the urine for culture. Follow-up with urology.  4:01 PM:  I have discussed the diagnosis/risks/treatment options with the patient and believe the pt to be eligible for discharge home to follow-up with PCP. We also  discussed returning to the ED immediately if new or worsening sx occur. We discussed the sx which are most concerning (e.g., sudden worsening pain, fever, inability to tolerate by mouth) that necessitate immediate return. Medications administered to the patient during their visit and any new prescriptions provided to the patient are listed below.  Medications given during this visit Medications  fosfomycin (MONUROL) packet 3 g (3 g Oral Given 04/15/16 1553)     The patient appears reasonably screen and/or stabilized for discharge and I doubt any other medical condition or other [facility] requiring further screening, evaluation, or treatment in the ED at this time prior to discharge.    Final Clinical Impressions(s) / ED Diagnoses   Final diagnoses:  UTI (lower urinary tract infection)    New Prescriptions New Prescriptions   PHENAZOPYRIDINE (PYRIDIUM) 200 MG TABLET    Take 1 tablet (200 mg total) by mouth 3 (three) times daily.    I personally performed the services described in this documentation, which was scribed in my presence. The recorded information has been reviewed and is accurate.     [name], DO 04/15/16 1601

## 2016-04-15 NOTE — ED Triage Notes (Signed)
Patient states she was seen (04/01/2016) and diagnosed with UTI. Patient states she finished her Cipro and developed a yeast infection. Patient states she was given 3 days of diflucan and finished it. Patient states her urinary symptoms haven't resolved. Patient states she sees blood intermittently in her urine and sediment in her urine at each void. Patient c/o low back pain and nausea with vomiting.

## 2016-04-15 NOTE — ED Notes (Signed)
Pt given d/c instructions as per chart. Rx x 1. Verbalizes understanding. No questions. 

## 2016-04-16 DIAGNOSIS — R079 Chest pain, unspecified: Secondary | ICD-10-CM | POA: Diagnosis not present

## 2016-04-17 DIAGNOSIS — N390 Urinary tract infection, site not specified: Secondary | ICD-10-CM | POA: Diagnosis not present

## 2016-04-17 DIAGNOSIS — E119 Type 2 diabetes mellitus without complications: Secondary | ICD-10-CM | POA: Diagnosis not present

## 2016-04-17 DIAGNOSIS — E785 Hyperlipidemia, unspecified: Secondary | ICD-10-CM | POA: Diagnosis not present

## 2016-04-17 DIAGNOSIS — R079 Chest pain, unspecified: Secondary | ICD-10-CM | POA: Diagnosis not present

## 2016-04-18 LAB — URINE CULTURE: Culture: >=100000 — AB

## 2016-04-19 ENCOUNTER — Telehealth (HOSPITAL_BASED_OUTPATIENT_CLINIC_OR_DEPARTMENT_OTHER): Payer: Self-pay | Admitting: Emergency Medicine

## 2016-04-19 NOTE — Telephone Encounter (Signed)
Post ED Visit - Positive Culture Follow-up  Culture report reviewed by antimicrobial stewardship pharmacist:  []  Elenor Quinones, Pharm.D. []  Heide Guile, Pharm.D., BCPS []  Parks Neptune, Pharm.D. []  Alycia Rossetti, Pharm.D., BCPS []  [place], [place].D., BCPS, AAHIVP []  Legrand Como, Pharm.D., BCPS, AAHIVP []  Milus Glazier, Pharm.D. []  [place], [place].D Dimitri Ped PharmD  Positive urine culture Treated with fosfomycin, organism sensitive to the same and no further patient follow-up is required at this time.  Hazle Nordmann 04/19/2016, 9:29 AM

## 2016-12-14 ENCOUNTER — Emergency Department (HOSPITAL_BASED_OUTPATIENT_CLINIC_OR_DEPARTMENT_OTHER)
Admission: EM | Admit: 2016-12-14 | Discharge: 2016-12-14 | Disposition: A | Discharge status: Home / Self Care | Payer: Medicare Other | Attending: Emergency Medicine | Admitting: Emergency Medicine

## 2016-12-14 ENCOUNTER — Emergency Department (HOSPITAL_BASED_OUTPATIENT_CLINIC_OR_DEPARTMENT_OTHER): Payer: Medicare Other

## 2016-12-14 ENCOUNTER — Encounter (HOSPITAL_BASED_OUTPATIENT_CLINIC_OR_DEPARTMENT_OTHER): Payer: Self-pay

## 2016-12-14 DIAGNOSIS — E119 Type 2 diabetes mellitus without complications: Secondary | ICD-10-CM | POA: Insufficient documentation

## 2016-12-14 DIAGNOSIS — Z7982 Long term (current) use of aspirin: Secondary | ICD-10-CM | POA: Diagnosis not present

## 2016-12-14 DIAGNOSIS — S29019A Strain of muscle and tendon of unspecified wall of thorax, initial encounter: Secondary | ICD-10-CM

## 2016-12-14 DIAGNOSIS — S161XXA Strain of muscle, fascia and tendon at neck level, initial encounter: Secondary | ICD-10-CM | POA: Diagnosis not present

## 2016-12-14 DIAGNOSIS — Z79899 Other long term (current) drug therapy: Secondary | ICD-10-CM | POA: Diagnosis not present

## 2016-12-14 DIAGNOSIS — Y9389 Activity, other specified: Secondary | ICD-10-CM | POA: Diagnosis not present

## 2016-12-14 DIAGNOSIS — Y92410 Unspecified street and highway as the place of occurrence of the external cause: Secondary | ICD-10-CM | POA: Insufficient documentation

## 2016-12-14 DIAGNOSIS — Y999 Unspecified external cause status: Secondary | ICD-10-CM | POA: Diagnosis not present

## 2016-12-14 DIAGNOSIS — Z794 Long term (current) use of insulin: Secondary | ICD-10-CM | POA: Insufficient documentation

## 2016-12-14 DIAGNOSIS — I10 Essential (primary) hypertension: Secondary | ICD-10-CM | POA: Diagnosis not present

## 2016-12-14 DIAGNOSIS — S199XXA Unspecified injury of neck, initial encounter: Secondary | ICD-10-CM | POA: Diagnosis present

## 2016-12-14 DIAGNOSIS — I2510 Atherosclerotic heart disease of native coronary artery without angina pectoris: Secondary | ICD-10-CM | POA: Insufficient documentation

## 2016-12-14 DIAGNOSIS — S29012A Strain of muscle and tendon of back wall of thorax, initial encounter: Secondary | ICD-10-CM | POA: Diagnosis not present

## 2016-12-14 MED ORDER — HYDROCODONE-ACETAMINOPHEN 5-325 MG PO TABS: 1.0000 | ORAL_TABLET | ORAL | 0 refills | Status: DC | PRN

## 2016-12-14 MED ORDER — DIAZEPAM 5 MG PO TABS
5.0000 mg | ORAL_TABLET | Freq: Once | ORAL | Status: AC
  Administered 2016-12-14: 5 mg via ORAL
  Filled 2016-12-14: qty 1

## 2016-12-14 MED ORDER — ONDANSETRON 4 MG PO TBDP: 4.0000 mg | ORAL_TABLET | Freq: Three times a day (TID) | ORAL | 0 refills | Status: AC | PRN

## 2016-12-14 MED ORDER — ONDANSETRON HCL 8 MG PO TABS
4.0000 mg | ORAL_TABLET | Freq: Once | ORAL | Status: AC
  Administered 2016-12-14: 4 mg via ORAL
  Filled 2016-12-14: qty 1

## 2016-12-14 MED ORDER — FENTANYL CITRATE (PF) 100 MCG/2ML IJ SOLN
50.0000 ug | Freq: Once | INTRAMUSCULAR | Status: AC
  Administered 2016-12-14: 50 ug via INTRAMUSCULAR
  Filled 2016-12-14: qty 2

## 2016-12-14 MED FILL — ONDANSETRON ODT 4 MG TABLET: 4 | 4 days supply | Qty: 10 | Fill #0

## 2016-12-14 MED FILL — HYDROCODON-APAP 5-325: 5-325 | 2 days supply | Qty: 10 | Fill #0
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
## 2017-01-06 ENCOUNTER — Encounter (HOSPITAL_BASED_OUTPATIENT_CLINIC_OR_DEPARTMENT_OTHER): Payer: Self-pay | Admitting: Emergency Medicine

## 2017-01-06 ENCOUNTER — Emergency Department (HOSPITAL_BASED_OUTPATIENT_CLINIC_OR_DEPARTMENT_OTHER)
Admission: EM | Admit: 2017-01-06 | Discharge: 2017-01-06 | Disposition: A | Discharge status: Home / Self Care | Payer: Medicare Other | Attending: Emergency Medicine | Admitting: Emergency Medicine

## 2017-01-06 DIAGNOSIS — Z950 Presence of cardiac pacemaker: Secondary | ICD-10-CM | POA: Insufficient documentation

## 2017-01-06 DIAGNOSIS — E119 Type 2 diabetes mellitus without complications: Secondary | ICD-10-CM | POA: Insufficient documentation

## 2017-01-06 DIAGNOSIS — Z79899 Other long term (current) drug therapy: Secondary | ICD-10-CM | POA: Insufficient documentation

## 2017-01-06 DIAGNOSIS — R300 Dysuria: Secondary | ICD-10-CM | POA: Diagnosis present

## 2017-01-06 DIAGNOSIS — Z8542 Personal history of malignant neoplasm of other parts of uterus: Secondary | ICD-10-CM | POA: Diagnosis not present

## 2017-01-06 DIAGNOSIS — I10 Essential (primary) hypertension: Secondary | ICD-10-CM | POA: Diagnosis not present

## 2017-01-06 DIAGNOSIS — Z7982 Long term (current) use of aspirin: Secondary | ICD-10-CM | POA: Diagnosis not present

## 2017-01-06 DIAGNOSIS — I2510 Atherosclerotic heart disease of native coronary artery without angina pectoris: Secondary | ICD-10-CM | POA: Insufficient documentation

## 2017-01-06 DIAGNOSIS — N390 Urinary tract infection, site not specified: Secondary | ICD-10-CM | POA: Insufficient documentation

## 2017-01-06 LAB — URINALYSIS, ROUTINE W REFLEX MICROSCOPIC
Ketones, ur: 15 mg/dL — AB
Leukocytes, UA: NEGATIVE
Nitrite: POSITIVE — AB
PH: 5 (ref 5.0–8.0)
Protein, ur: NEGATIVE mg/dL

## 2017-01-06 LAB — URINALYSIS, MICROSCOPIC (REFLEX)

## 2017-01-06 MED ORDER — SULFAMETHOXAZOLE-TRIMETHOPRIM 800-160 MG PO TABS: 1.0000 | ORAL_TABLET | Freq: Two times a day (BID) | ORAL | 0 refills | Status: AC
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
## 2017-02-16 IMAGING — CR DG CHEST 2V
2 series · 2 of 2 positions shown · non-contrast
Comparison: Chest x-ray 10/26/2013.

CLINICAL DATA: 65-year-old female with midsternal chest pain
radiating to the left side of the neck and left arm. Emesis 5-6
times this morning.

EXAM:
CHEST  2 VIEW

[chest pa]
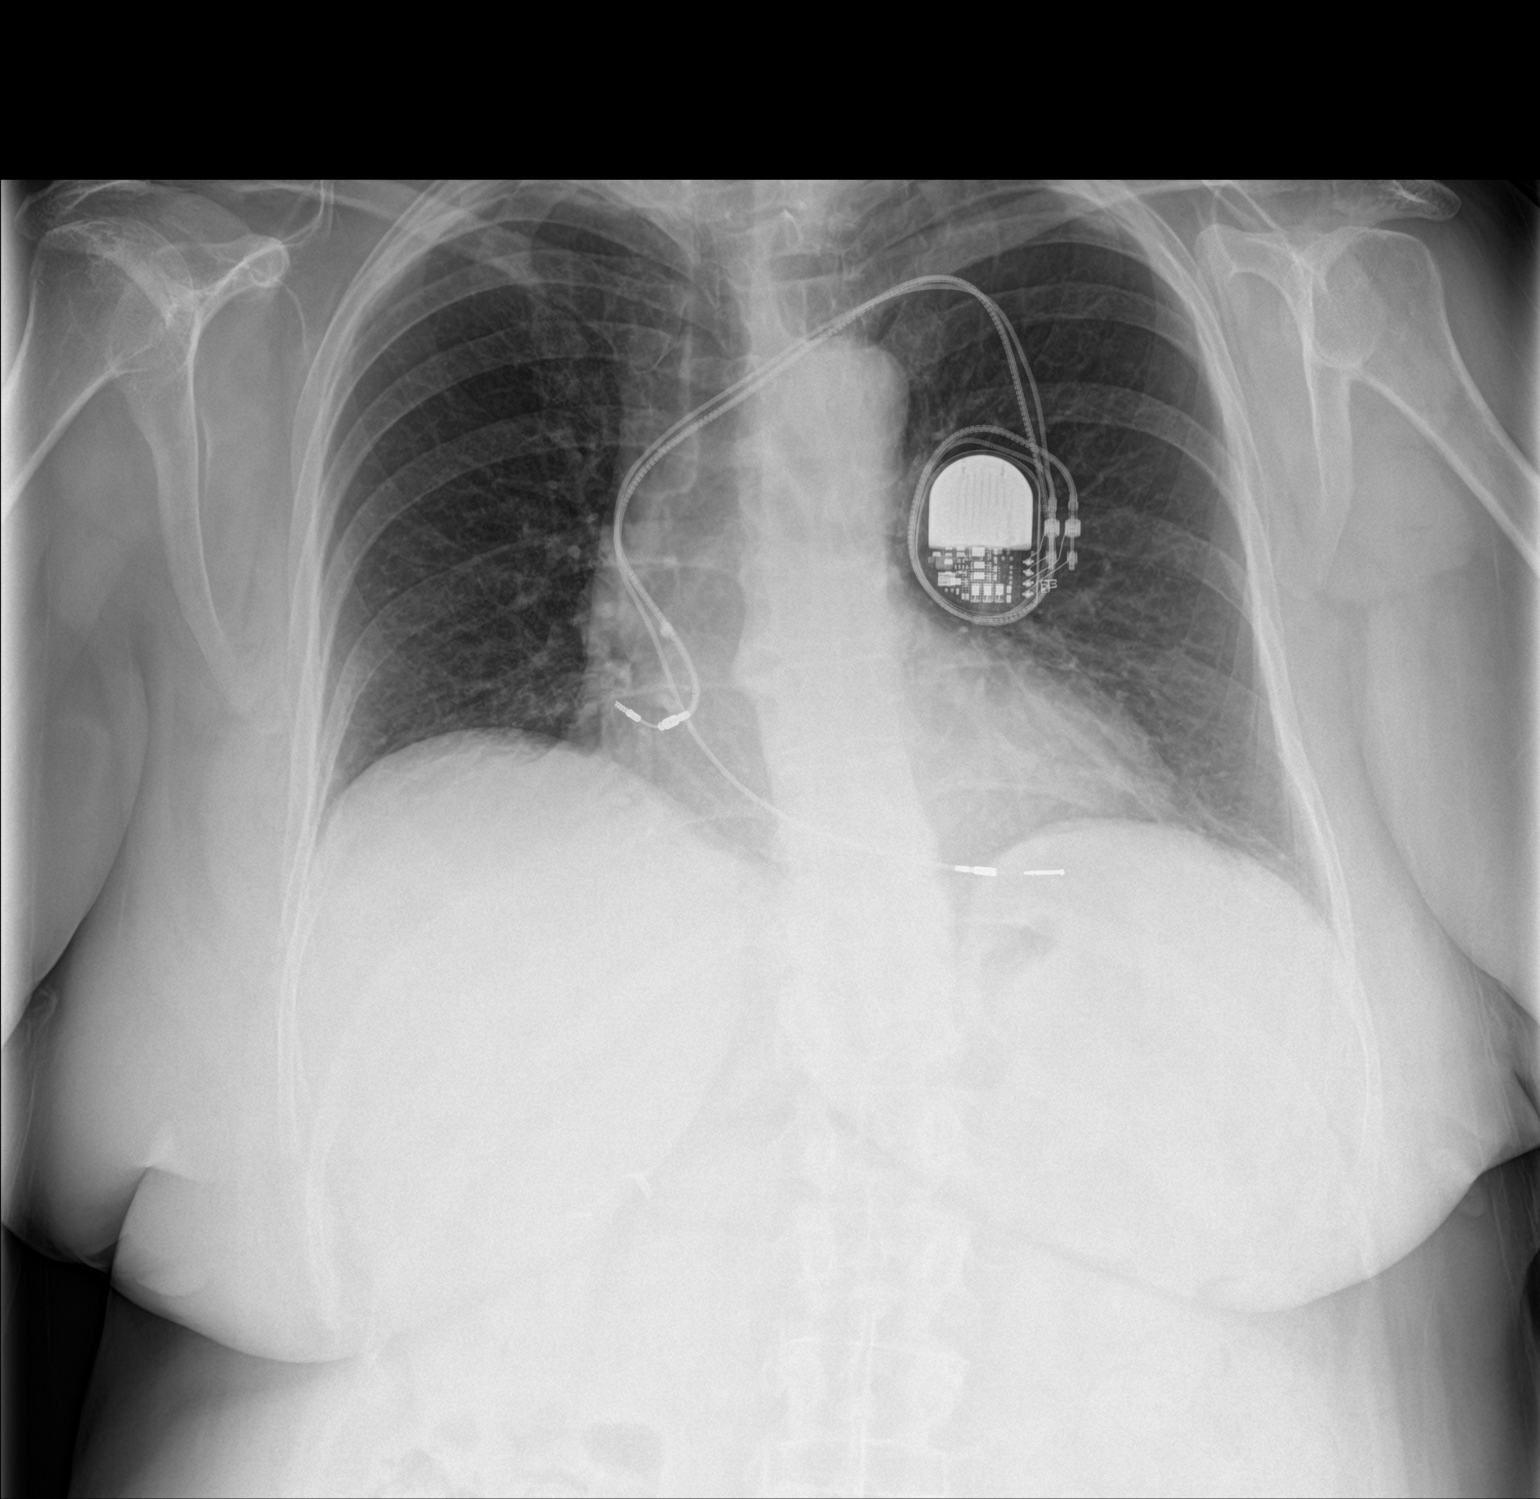

[chest lat]
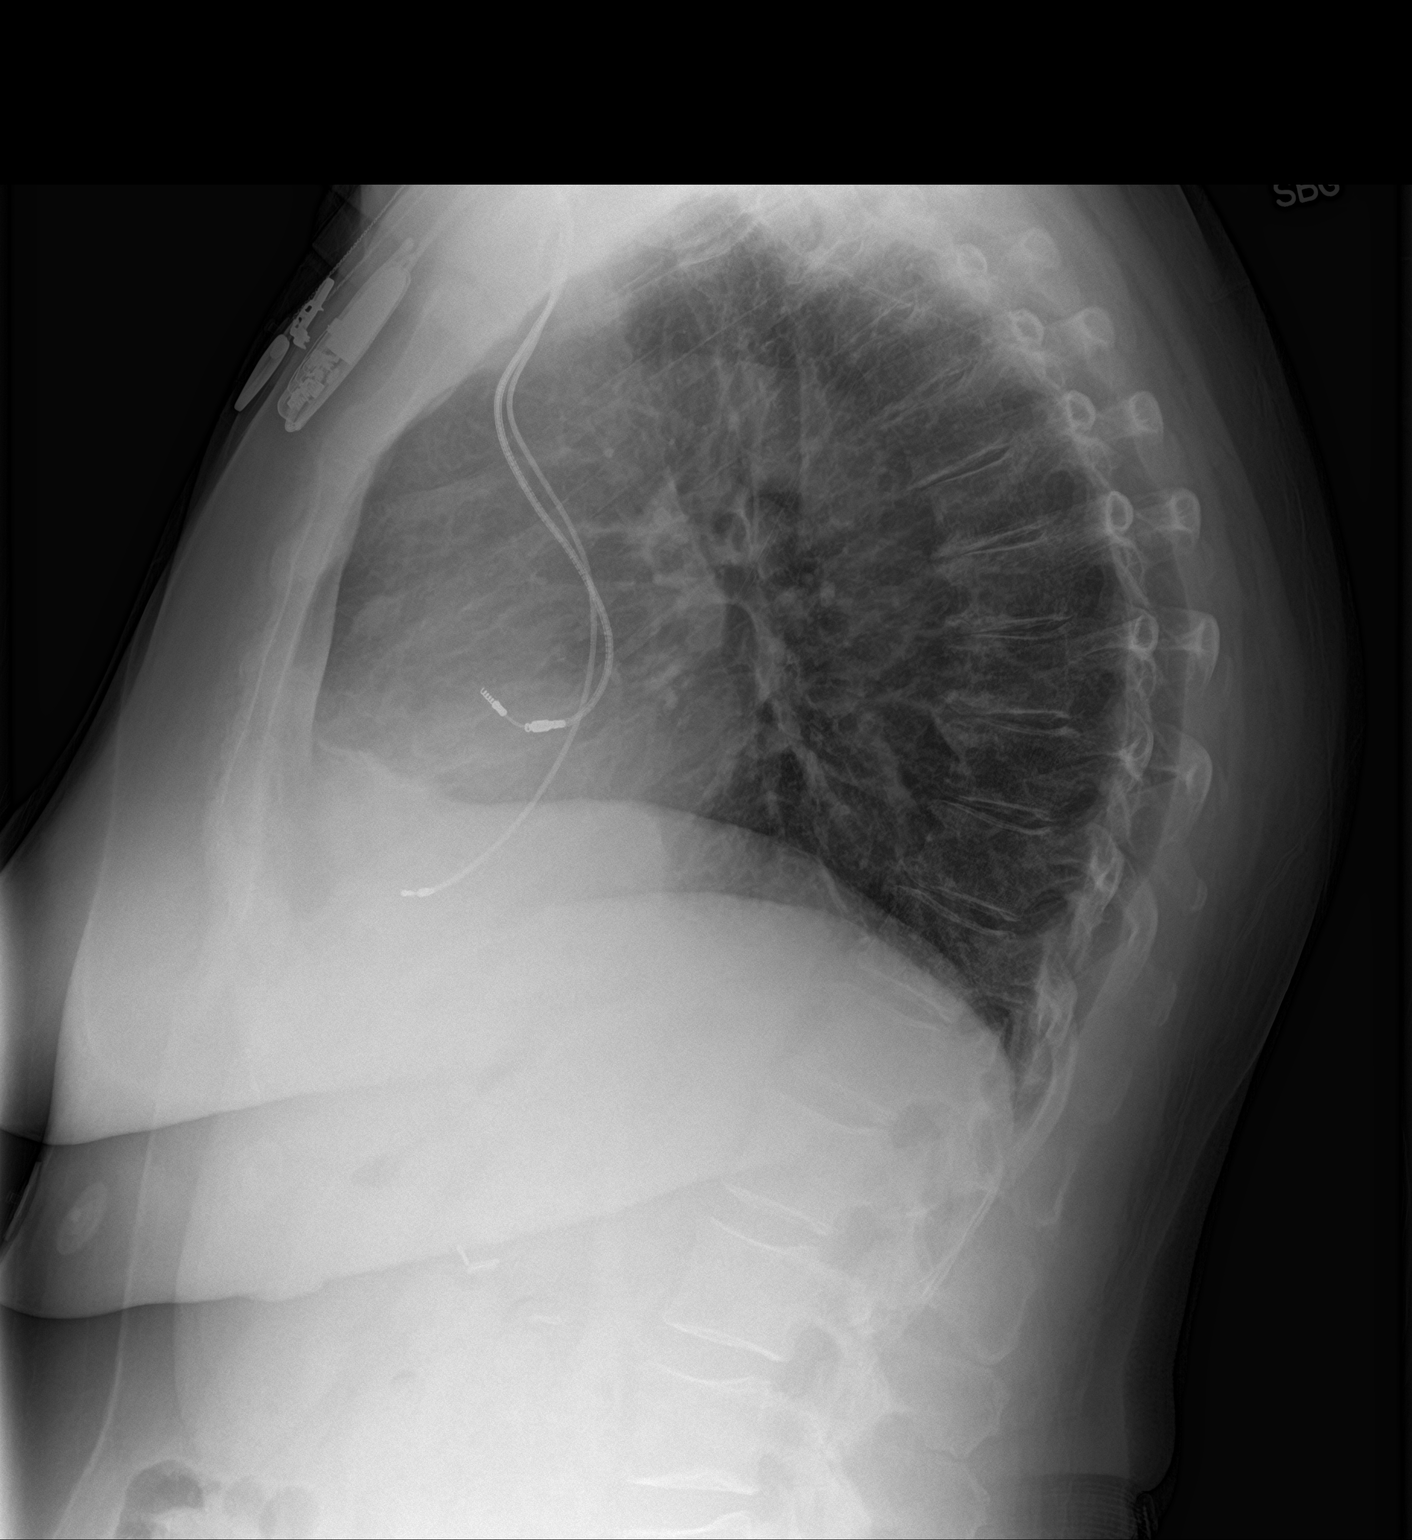

[2 of 2 positions shown; findings below may reference images not displayed]

FINDINGS: Lung volumes are low. No consolidative airspace disease. No pleural
effusions. No pneumothorax. No pulmonary nodule or mass noted.
Pulmonary vasculature and the cardiomediastinal silhouette are
within normal limits. Atherosclerosis in the thoracic aorta.
Left-sided pacemaker device in position with lead tips projecting
over the expected location of the right atrium and right ventricular
apex. Surgical clips project over the right upper quadrant of the
abdomen, compatible with prior cholecystectomy.
IMPRESSION: 1. Low lung volumes without radiographic evidence of acute
cardiopulmonary disease.
2. Atherosclerosis.

## 2017-03-18 DEATH — deceased
